# Patient Record
Sex: Female | Born: 1970
Health system: Southern US, Community
[De-identification: ages and names within clinical notes are randomized; demographics above are authoritative.]

## PROBLEM LIST (undated history)

## (undated) DIAGNOSIS — E559 Vitamin D deficiency, unspecified: Secondary | ICD-10-CM

## (undated) DIAGNOSIS — K219 Gastro-esophageal reflux disease without esophagitis: Secondary | ICD-10-CM

## (undated) DIAGNOSIS — G4730 Sleep apnea, unspecified: Secondary | ICD-10-CM

## (undated) DIAGNOSIS — M1990 Unspecified osteoarthritis, unspecified site: Secondary | ICD-10-CM

## (undated) DIAGNOSIS — I10 Essential (primary) hypertension: Secondary | ICD-10-CM

## (undated) DIAGNOSIS — K589 Irritable bowel syndrome without diarrhea: Secondary | ICD-10-CM

## (undated) DIAGNOSIS — I4891 Unspecified atrial fibrillation: Secondary | ICD-10-CM

## (undated) DIAGNOSIS — J45909 Unspecified asthma, uncomplicated: Secondary | ICD-10-CM

## (undated) DIAGNOSIS — I499 Cardiac arrhythmia, unspecified: Secondary | ICD-10-CM

## (undated) DIAGNOSIS — J189 Pneumonia, unspecified organism: Secondary | ICD-10-CM

## (undated) HISTORY — DX: Unspecified atrial fibrillation: I48.91

## (undated) HISTORY — PX: ENDOMETRIAL ABLATION: SHX621

## (undated) HISTORY — PX: COLONOSCOPY: SHX174

## (undated) HISTORY — DX: Vitamin D deficiency, unspecified: E55.9

---

## 1999-07-16 ENCOUNTER — Other Ambulatory Visit: Admission: RE | Admit: 1999-07-16 | Discharge: 1999-07-16 | Payer: Self-pay | Admitting: Otolaryngology

## 2000-09-10 ENCOUNTER — Encounter: Admission: RE | Admit: 2000-09-10 | Discharge: 2000-09-10 | Payer: Self-pay | Admitting: Surgery

## 2000-09-10 ENCOUNTER — Encounter: Payer: Self-pay | Admitting: Surgery

## 2002-04-28 ENCOUNTER — Other Ambulatory Visit: Admission: RE | Admit: 2002-04-28 | Discharge: 2002-04-28 | Payer: Self-pay | Admitting: Obstetrics and Gynecology

## 2003-10-16 ENCOUNTER — Other Ambulatory Visit: Admission: RE | Admit: 2003-10-16 | Discharge: 2003-10-16 | Payer: Self-pay | Admitting: Obstetrics and Gynecology

## 2004-12-24 ENCOUNTER — Other Ambulatory Visit: Admission: RE | Admit: 2004-12-24 | Discharge: 2004-12-24 | Payer: Self-pay | Admitting: Obstetrics and Gynecology

## 2005-12-29 ENCOUNTER — Other Ambulatory Visit: Admission: RE | Admit: 2005-12-29 | Discharge: 2005-12-29 | Payer: Self-pay | Admitting: Obstetrics and Gynecology

## 2006-01-08 ENCOUNTER — Encounter: Admission: RE | Admit: 2006-01-08 | Discharge: 2006-01-08 | Payer: Self-pay | Admitting: Obstetrics and Gynecology

## 2007-03-02 ENCOUNTER — Encounter: Admission: RE | Admit: 2007-03-02 | Discharge: 2007-03-02 | Payer: Self-pay | Admitting: Obstetrics and Gynecology

## 2009-02-25 ENCOUNTER — Encounter: Admission: RE | Admit: 2009-02-25 | Discharge: 2009-02-25 | Payer: Self-pay | Admitting: Family Medicine

## 2009-04-29 ENCOUNTER — Encounter: Admission: RE | Admit: 2009-04-29 | Discharge: 2009-04-29 | Payer: Self-pay | Admitting: Family Medicine

## 2010-10-26 ENCOUNTER — Encounter: Payer: Self-pay | Admitting: Family Medicine

## 2013-07-21 ENCOUNTER — Other Ambulatory Visit: Payer: Self-pay

## 2013-07-21 DIAGNOSIS — Z1231 Encounter for screening mammogram for malignant neoplasm of breast: Secondary | ICD-10-CM

## 2013-09-08 ENCOUNTER — Ambulatory Visit: Payer: Self-pay

## 2013-09-11 ENCOUNTER — Ambulatory Visit: Payer: Self-pay

## 2013-10-13 ENCOUNTER — Ambulatory Visit
Admission: RE | Admit: 2013-10-13 | Discharge: 2013-10-13 | Disposition: A | Discharge status: Home / Self Care | Payer: 59 | Source: Ambulatory Visit

## 2013-10-13 DIAGNOSIS — Z1231 Encounter for screening mammogram for malignant neoplasm of breast: Secondary | ICD-10-CM

## 2013-10-17 ENCOUNTER — Other Ambulatory Visit: Payer: Self-pay | Admitting: Family Medicine

## 2013-10-17 DIAGNOSIS — R928 Other abnormal and inconclusive findings on diagnostic imaging of breast: Secondary | ICD-10-CM

## 2013-10-27 ENCOUNTER — Other Ambulatory Visit: Payer: 59

## 2013-11-03 ENCOUNTER — Ambulatory Visit
Admission: RE | Admit: 2013-11-03 | Discharge: 2013-11-03 | Disposition: A | Discharge status: Home / Self Care | Payer: 59 | Source: Ambulatory Visit | Attending: Family Medicine | Admitting: Family Medicine

## 2013-11-03 DIAGNOSIS — R928 Other abnormal and inconclusive findings on diagnostic imaging of breast: Secondary | ICD-10-CM

## 2014-11-13 ENCOUNTER — Other Ambulatory Visit: Payer: Self-pay

## 2014-11-13 DIAGNOSIS — Z1231 Encounter for screening mammogram for malignant neoplasm of breast: Secondary | ICD-10-CM

## 2014-11-21 ENCOUNTER — Ambulatory Visit: Payer: Self-pay

## 2014-11-27 ENCOUNTER — Ambulatory Visit
Admission: RE | Admit: 2014-11-27 | Discharge: 2014-11-27 | Disposition: A | Discharge status: Home / Self Care | Payer: 59 | Source: Ambulatory Visit

## 2014-11-27 DIAGNOSIS — Z1231 Encounter for screening mammogram for malignant neoplasm of breast: Secondary | ICD-10-CM

## 2015-05-14 ENCOUNTER — Other Ambulatory Visit: Payer: Self-pay | Admitting: Internal Medicine

## 2015-05-14 DIAGNOSIS — E049 Nontoxic goiter, unspecified: Secondary | ICD-10-CM

## 2015-05-31 ENCOUNTER — Other Ambulatory Visit: Payer: 59

## 2015-06-11 DIAGNOSIS — K219 Gastro-esophageal reflux disease without esophagitis: Secondary | ICD-10-CM | POA: Insufficient documentation

## 2015-06-11 DIAGNOSIS — J3089 Other allergic rhinitis: Secondary | ICD-10-CM | POA: Insufficient documentation

## 2015-06-11 DIAGNOSIS — J45901 Unspecified asthma with (acute) exacerbation: Secondary | ICD-10-CM | POA: Insufficient documentation

## 2015-06-11 DIAGNOSIS — J4520 Mild intermittent asthma, uncomplicated: Secondary | ICD-10-CM

## 2015-06-11 DIAGNOSIS — J309 Allergic rhinitis, unspecified: Secondary | ICD-10-CM

## 2015-06-11 DIAGNOSIS — H1010 Acute atopic conjunctivitis, unspecified eye: Secondary | ICD-10-CM

## 2015-06-11 DIAGNOSIS — J302 Other seasonal allergic rhinitis: Secondary | ICD-10-CM | POA: Insufficient documentation

## 2015-07-01 ENCOUNTER — Ambulatory Visit
Admission: RE | Admit: 2015-07-01 | Discharge: 2015-07-01 | Disposition: A | Discharge status: Home / Self Care | Payer: Commercial Managed Care - HMO | Source: Ambulatory Visit | Attending: Internal Medicine | Admitting: Internal Medicine

## 2015-07-01 DIAGNOSIS — E049 Nontoxic goiter, unspecified: Secondary | ICD-10-CM

## 2015-07-12 ENCOUNTER — Ambulatory Visit (INDEPENDENT_AMBULATORY_CARE_PROVIDER_SITE_OTHER): Payer: Commercial Managed Care - HMO | Admitting: Neurology

## 2015-07-12 DIAGNOSIS — J309 Allergic rhinitis, unspecified: Secondary | ICD-10-CM | POA: Diagnosis not present

## 2015-07-17 DIAGNOSIS — R1030 Lower abdominal pain, unspecified: Secondary | ICD-10-CM | POA: Insufficient documentation

## 2015-07-17 DIAGNOSIS — K625 Hemorrhage of anus and rectum: Secondary | ICD-10-CM | POA: Insufficient documentation

## 2015-07-17 DIAGNOSIS — I10 Essential (primary) hypertension: Secondary | ICD-10-CM | POA: Insufficient documentation

## 2015-07-17 DIAGNOSIS — K55039 Acute (reversible) ischemia of large intestine, extent unspecified: Secondary | ICD-10-CM | POA: Insufficient documentation

## 2015-07-19 DIAGNOSIS — K648 Other hemorrhoids: Secondary | ICD-10-CM | POA: Insufficient documentation

## 2015-07-19 LAB — HM COLONOSCOPY

## 2015-07-24 ENCOUNTER — Ambulatory Visit (INDEPENDENT_AMBULATORY_CARE_PROVIDER_SITE_OTHER): Payer: Commercial Managed Care - HMO | Admitting: Neurology

## 2015-07-24 DIAGNOSIS — J309 Allergic rhinitis, unspecified: Secondary | ICD-10-CM

## 2015-08-07 ENCOUNTER — Ambulatory Visit (INDEPENDENT_AMBULATORY_CARE_PROVIDER_SITE_OTHER): Payer: Commercial Managed Care - HMO | Admitting: Neurology

## 2015-08-07 DIAGNOSIS — J309 Allergic rhinitis, unspecified: Secondary | ICD-10-CM | POA: Diagnosis not present

## 2015-08-16 ENCOUNTER — Ambulatory Visit (INDEPENDENT_AMBULATORY_CARE_PROVIDER_SITE_OTHER): Payer: Commercial Managed Care - HMO | Admitting: *Deleted

## 2015-08-16 DIAGNOSIS — J309 Allergic rhinitis, unspecified: Secondary | ICD-10-CM

## 2015-12-26 ENCOUNTER — Other Ambulatory Visit: Payer: Self-pay | Admitting: Allergy and Immunology

## 2015-12-26 NOTE — Telephone Encounter (Signed)
Needs ov for further refills 

## 2016-01-21 ENCOUNTER — Other Ambulatory Visit: Payer: Self-pay | Admitting: Allergy and Immunology

## 2016-02-02 ENCOUNTER — Other Ambulatory Visit: Payer: Self-pay | Admitting: Allergy and Immunology

## 2016-02-05 ENCOUNTER — Other Ambulatory Visit: Payer: Self-pay | Admitting: Surgery

## 2016-02-19 ENCOUNTER — Encounter (HOSPITAL_COMMUNITY): Payer: Self-pay

## 2016-02-19 ENCOUNTER — Encounter (HOSPITAL_COMMUNITY)
Admission: RE | Admit: 2016-02-19 | Discharge: 2016-02-19 | Disposition: A | Discharge status: Home / Self Care | Payer: Commercial Managed Care - HMO | Source: Ambulatory Visit | Attending: Surgery | Admitting: Surgery

## 2016-02-19 DIAGNOSIS — Z79899 Other long term (current) drug therapy: Secondary | ICD-10-CM | POA: Insufficient documentation

## 2016-02-19 DIAGNOSIS — R000 Tachycardia, unspecified: Secondary | ICD-10-CM | POA: Diagnosis not present

## 2016-02-19 DIAGNOSIS — J45909 Unspecified asthma, uncomplicated: Secondary | ICD-10-CM | POA: Diagnosis not present

## 2016-02-19 DIAGNOSIS — Z01812 Encounter for preprocedural laboratory examination: Secondary | ICD-10-CM | POA: Diagnosis not present

## 2016-02-19 DIAGNOSIS — R2231 Localized swelling, mass and lump, right upper limb: Secondary | ICD-10-CM | POA: Insufficient documentation

## 2016-02-19 DIAGNOSIS — I10 Essential (primary) hypertension: Secondary | ICD-10-CM | POA: Diagnosis not present

## 2016-02-19 DIAGNOSIS — Z01818 Encounter for other preprocedural examination: Secondary | ICD-10-CM | POA: Insufficient documentation

## 2016-02-19 HISTORY — DX: Gastro-esophageal reflux disease without esophagitis: K21.9

## 2016-02-19 HISTORY — DX: Essential (primary) hypertension: I10

## 2016-02-19 HISTORY — DX: Unspecified osteoarthritis, unspecified site: M19.90

## 2016-02-19 HISTORY — DX: Unspecified asthma, uncomplicated: J45.909

## 2016-02-19 HISTORY — DX: Irritable bowel syndrome, unspecified: K58.9

## 2016-02-19 LAB — CBC
HEMATOCRIT: 39.2 % (ref 36.0–46.0)
Hemoglobin: 12.8 g/dL (ref 12.0–15.0)
MCH: 29.2 pg (ref 26.0–34.0)
MCHC: 32.7 g/dL (ref 30.0–36.0)
MCV: 89.3 fL (ref 78.0–100.0)
Platelets: 300 10*3/uL (ref 150–400)
RBC: 4.39 MIL/uL (ref 3.87–5.11)
RDW: 14.3 % (ref 11.5–15.5)
WBC: 6.1 10*3/uL (ref 4.0–10.5)

## 2016-02-19 LAB — HCG, SERUM, QUALITATIVE: Preg, Serum: NEGATIVE

## 2016-02-19 NOTE — Pre-Procedure Instructions (Addendum)
Tracey Escobar  02/19/2016      [facility] [id number] Tracey Escobar, Tracey Escobar - [address] [facility] Tracey Escobar [address] [place] [id number] Phone: [phone] Fax: [phone]    Your procedure is scheduled on 02/25/16  [facility]  Call this number if you have problems the morning of surgery:  [phone]   Remember:  Do not eat food or drink liquids after midnight.  Take these medicines the morning of surgery with A SIP OF WATER inhalers(bring albuterol), xyzal,nasonex if needed, eye drops if needed, prilosec  STOP all herbel meds, nsaids (aleve,naproxen,advil,ibuprofen) starting today, including vitamins, aspirin, phenteramine   Do not wear jewelry, make-up or nail polish.  Do not wear lotions, powders, or perfumes.  You may wear deodorant.  Do not shave 48 hours prior to surgery.  Men may shave face and neck.  Do not bring valuables to the hospital.  [facility] is not responsible for any belongings or valuables.  Contacts, dentures or bridgework may not be worn into surgery.  Leave your suitcase in the car.  After surgery it may be brought to your room.  For patients admitted to the hospital, discharge time will be determined by your treatment team.  Patients discharged the day of surgery will not be allowed to drive home.   Name and phone number of your driver:    Special instructions:   Special Instructions: Tracey Escobar - Preparing for Surgery  Before surgery, you can play an important role.  Because skin is not sterile, your skin needs to be as free of germs as possible.  You can reduce the number of germs on you skin by washing with CHG (chlorahexidine gluconate) soap before surgery.  CHG is an antiseptic cleaner which kills germs and bonds with the skin to continue killing germs even after washing.  Please DO NOT use if you have an allergy to CHG or antibacterial soaps.  If your skin becomes  reddened/irritated stop using the CHG and inform your nurse when you arrive at Short Stay.  Do not shave (including legs and underarms) for at least 48 hours prior to the first CHG shower.  You may shave your face.  Please follow these instructions carefully:   1.  Shower with CHG Soap the night before surgery and the morning of Surgery.  2.  If you choose to wash your hair, wash your hair first as usual with your normal shampoo.  3.  After you shampoo, rinse your hair and body thoroughly to remove the Shampoo.  4.  Use CHG as you would any other liquid soap.  You can apply chg directly  to the skin and wash gently with scrungie or a clean washcloth.  5.  Apply the CHG Soap to your body ONLY FROM THE NECK DOWN.  Do not use on open wounds or open sores.  Avoid contact with your eyes ears, mouth and genitals (private parts).  Wash genitals (private parts)       with your normal soap.  6.  Wash thoroughly, paying special attention to the area where your surgery will be performed.  7.  Thoroughly rinse your body with warm water from the neck down.  8.  DO NOT shower/wash with your normal soap after using and rinsing off the CHG Soap.  9.  Pat yourself dry with a clean towel.  10.  Wear clean pajamas.            11.  Place clean sheets on your bed the night of your first shower and do not sleep with pets.  Day of Surgery  Do not apply any lotions/deodorants the morning of surgery.  Please wear clean clothes to the hospital/surgery center.  Please read over the following fact sheets that you were given. Pain Booklet, Coughing and Deep Breathing and Surgical Site Infection Prevention

## 2016-02-20 ENCOUNTER — Encounter (HOSPITAL_COMMUNITY): Payer: Self-pay

## 2016-02-20 NOTE — Progress Notes (Signed)
Re-requested EKG/stress test/[facility]

## 2016-02-20 NOTE — Progress Notes (Signed)
Anesthesia Chart Review:  Pt is a 45 year old female scheduled for R upper arm mass excision on 02/25/2016 with Dr. Barrie Dunker. [name].  Cardiologist is Dr. Yates DecampJay Ganji.   PMH includes:  HTN, asthma. Never smoker. BMI 41  Medications include: albuterol, dulera, prilosec, phentermine, spironolactone, telmisartan. Pt instructed to stop phentermine at PAT (02/19/16).   Preoperative labs reviewed.  BMET not obtained at PAT and will need to be obtained DOS.   EKG 02/19/16: sinus tachycardia (102 bpm)  Exercise treadmill test 05/10/15:  - ECG showing sinus tach. No arrhythmias noted. No ST-T changed of ischemia - Markedly reduced aerobic tolerance  Echo 05/09/15:  - LV cavity normal in size. Mild concentric LVH. Normal global wall motion. EF 55%. Grade I diastolic dysfunction.  - LA cavity normal in size. An aneurysm with possible PFO is present. Suggest air bubble study to look for shunt - Mild MR - Mild TR. No evidence of pulmonary HTN  By Dr. Verl DickerGanji's notes, pt had cardiac cath 03/06/11 that showed ramus intermediate with 70% ostial stenosis, otherwise normal.   By Dr. Verl DickerGanji's notes, pt has mild L ICA stenosis (16-49%) by carotid duplex 05/09/15.   If labs acceptable DOS, I anticipate pt can proceed as scheduled.   Rica Mastngela Dana Dorner, FNP-BC [facility]iology Phone: [phone] 02/20/2016 3:22 PM

## 2016-02-24 MED ORDER — CEFAZOLIN SODIUM-DEXTROSE 2-4 GM/100ML-% IV SOLN
2.0000 g | INTRAVENOUS | Status: AC
Start: 2016-02-25 — End: 2016-02-25
  Administered 2016-02-25: 2 g via INTRAVENOUS
  Filled 2016-02-24: qty 100

## 2016-02-24 NOTE — H&P (Signed)
Tracey Escobar 02/05/2016 10:36 AM Location: [facility] Patient #: [id number] DOB: 06/02/1971 Single / Language: [place] / Race: Black or African American Female   History of Present Illness (Tracey Mahler A. Magnus IvanBlackman MD; 02/05/2016 10:42 AM) Patient words: lipoma right arm.  The patient is a 45 year old female who presents with a complaint of Mass. This is a pleasant female referred by Dr. Dorothyann Pengobyn Escobar for evaluation of a right posterior upper arm mass. The patient has had the mass for several years but it is now slowly larger and causing her to have focal pain at the back of her arm. She has no paresthesias in the arm. She denies trauma to the area. She has no previous history of masses. She denies drainage from the area. She has had no fevers or night sweats. She is otherwise without complaints.   Other Problems Tracey Escobar(Tracey Escobar, RMA; 02/05/2016 10:36 AM) Asthma Gastroesophageal Reflux Disease High blood pressure  Past Surgical History Tracey Escobar(Tracey Escobar, RMA; 02/05/2016 10:36 AM) No pertinent past surgical history  Diagnostic Studies History Tracey Escobar(Tracey Escobar, RMA; 02/05/2016 10:36 AM) Colonoscopy within last year Mammogram 1-3 years ago Pap Smear 1-5 years ago  Allergies Tracey Escobar(Tracey Escobar, RMA; 02/05/2016 10:37 AM) No Known Drug Allergies05/12/2015  Medication History Tracey Escobar(Tracey Escobar, RMA; 02/05/2016 10:40 AM) Levocetirizine Dihydrochloride (5MG  Tablet, Oral) Active. MedroxyPROGESTERone Acetate (150MG /ML Susp Pref Syr, Intramuscular) Active. Oseltamivir Phosphate (75MG  Capsule, Oral) Active. Pazeo (0.7% Solution, Ophthalmic) Active. Restasis (0.05% Emulsion, Ophthalmic) Active. Telmisartan (40MG  Tablet, Oral) Active. Albuterol (90MCG/ACT Aerosol Soln, Inhalation) Active. Omeprazole (20MG  Tablet DR, Oral) Active. Spironolactone (25MG  Tablet, Oral) Active. Senna (8.6MG  Tablet, Oral) Active. Dulera (200-5MCG/ACT Aerosol, Inhalation) Active. Vitamin D3 (1000UNIT Capsule,  Oral) Active. Medications Reconciled  Social History Tracey Escobar(Tracey Escobar, RMA; 02/05/2016 10:36 AM) Alcohol use Occasional alcohol use. Caffeine use Carbonated beverages, Coffee, Tea. No drug use Tobacco use Never smoker.  Family History Tracey Escobar(Tracey Escobar, RMA; 02/05/2016 10:36 AM) Arthritis Father. Diabetes Mellitus Brother. Hypertension Brother, Father, Mother.  Pregnancy / Birth History Tracey Escobar(Tracey Escobar, RMA; 02/05/2016 10:36 AM) Contraceptive History Depo-provera. Gravida 0 Para 0    Review of Systems (Tracey Escobar RMA; 02/05/2016 10:36 AM) General Present- Weight Loss. Not Present- Appetite Loss, Chills, Fatigue, Fever, Night Sweats and Weight Gain. Skin Present- Dryness and Rash. Not Present- Change in Wart/Mole, Hives, Jaundice, New Lesions, Non-Healing Wounds and Ulcer. HEENT Present- Seasonal Allergies, Sinus Pain and Wears glasses/contact lenses. Not Present- Earache, Hearing Loss, Hoarseness, Nose Bleed, Oral Ulcers, Ringing in the Ears, Sore Throat, Visual Disturbances and Yellow Eyes. Respiratory Not Present- Bloody sputum, Chronic Cough, Difficulty Breathing, Snoring and Wheezing. Breast Not Present- Breast Mass, Breast Pain, Nipple Discharge and Skin Changes. Cardiovascular Present- Shortness of Breath and Swelling of Extremities. Not Present- Chest Pain, Difficulty Breathing Lying Down, Leg Cramps, Palpitations and Rapid Heart Rate. Gastrointestinal Present- Constipation. Not Present- Abdominal Pain, Bloating, Bloody Stool, Change in Bowel Habits, Chronic diarrhea, Difficulty Swallowing, Excessive gas, Gets full quickly at meals, Hemorrhoids, Indigestion, Nausea, Rectal Pain and Vomiting. Female Genitourinary Not Present- Frequency, Nocturia, Painful Urination, Pelvic Pain and Urgency. Musculoskeletal Present- Joint Stiffness and Swelling of Extremities. Not Present- Back Pain, Joint Pain, Muscle Pain and Muscle Weakness. Neurological Not Present- Decreased Memory, Fainting,  Headaches, Numbness, Seizures, Tingling, Tremor, Trouble walking and Weakness. Psychiatric Not Present- Anxiety, Bipolar, Change in Sleep Pattern, Depression, Fearful and Frequent crying. Endocrine Not Present- Cold Intolerance, Excessive Hunger, Hair Changes, Heat Intolerance, Hot flashes and New Diabetes. Hematology Not Present- Easy Bruising, Excessive bleeding, Gland problems, HIV  and Persistent Infections.  Vitals (Tracey Escobar RMA; 02/05/2016 10:40 AM) 02/05/2016 10:40 AM Weight: 264 lb Height: 67in Body Surface Area: 2.28 m Body Mass Index: 41.35 kg/m  Temp.: 97.15F  Pulse: 88 (Regular)  BP: 144/88 (Sitting, Left Arm, Standard)       Physical Exam (Tracey Posch A. Magnus Ivan MD; 02/05/2016 10:44 AM) General Mental Status-Alert. General Appearance-Consistent with stated age. Hydration-Well hydrated. Voice-Normal.  Head and Neck Head-normocephalic, atraumatic with no lesions or palpable masses. Trachea-midline. Thyroid Gland Characteristics - normal size and consistency.  Eye Eyeball - Bilateral-Extraocular movements intact. Sclera/Conjunctiva - Bilateral-No scleral icterus.  Chest and Lung Exam Chest and lung exam reveals -quiet, even and easy respiratory effort with no use of accessory muscles and on auscultation, normal breath sounds, no adventitious sounds and normal vocal resonance. Inspection Chest Wall - Normal. Back - normal.  Breast - Did not examine.  Cardiovascular Cardiovascular examination reveals -normal heart sounds, regular rate and rhythm with no murmurs and normal pedal pulses bilaterally.  Abdomen - Did not examine.  Neurologic Neurologic evaluation reveals -alert and oriented x 3 with no impairment of recent or remote memory. Mental Status-Normal.  Musculoskeletal Normal Exam - Left-Upper Extremity Strength Normal and Lower Extremity Strength Normal. Normal Exam - Right-Upper Extremity Strength Normal and Lower  Extremity Strength Normal. Note: There is a large mass on the posterior aspect of her right upper arm. It is approximate 7 cm in size. It is soft and mobile. There are no skin changes. There is no axillary adenopathy.   Lymphatic Head & Neck  General Head & Neck Lymphatics: Bilateral - Description - Normal. Axillary  General Axillary Region: Bilateral - Description - Normal. Tenderness - Non Tender. Femoral & Inguinal - Did not examine.    Assessment & Plan (Wava Kildow A. Magnus Ivan MD; 02/05/2016 10:44 AM) ARM MASS, RIGHT (R22.31) Impression: This is a large mass of uncertain etiology. Because it is symptomatic, causing pain, and getting larger, surgical removal is recommended to rule out a malignancy as a sarcoma. Hopefully, this just represents a large symptomatic lipoma. I discussed the surgery with her in detail. I discussed the risks which includes but is not limited to bleeding, infection, injury to surrounding structures, recurrence, need for further surgery, etc. She understands and wishes to proceed with surgery

## 2016-02-24 NOTE — Anesthesia Preprocedure Evaluation (Addendum)
Anesthesia Evaluation  Patient identified by MRN, date of birth, ID band Patient awake    Reviewed: Allergy & Precautions, H&P , NPO status , Patient's Chart, lab work & pertinent test results  History of Anesthesia Complications Negative for: history of anesthetic complications  Airway Mallampati: II  TM Distance: >3 FB Neck ROM: full    Dental no notable dental hx. (+) Teeth Intact, Dental Advidsory Given   Pulmonary asthma ,    Pulmonary exam normal breath sounds clear to auscultation       Cardiovascular hypertension, Pt. on medications Normal cardiovascular exam Rhythm:regular Rate:Normal  2016 with normal Echo 55%, possible PFO, mild MR   Neuro/Psych negative neurological ROS     GI/Hepatic Neg liver ROS, GERD  ,  Endo/Other  negative endocrine ROS  Renal/GU negative Renal ROS     Musculoskeletal   Abdominal   Peds  Hematology negative hematology ROS (+)   Anesthesia Other Findings   Reproductive/Obstetrics negative OB ROS                          Anesthesia Physical Anesthesia Plan  ASA: II  Anesthesia Plan: General   Post-op Pain Management:    Induction: Intravenous  Airway Management Planned: Oral ETT  Additional Equipment:   Intra-op Plan:   Post-operative Plan:   Informed Consent: I have reviewed the patients History and Physical, chart, labs and discussed the procedure including the risks, benefits and alternatives for the proposed anesthesia with the patient or authorized representative who has indicated his/her understanding and acceptance.   Dental Advisory Given  Plan Discussed with: Anesthesiologist, CRNA and Surgeon  Anesthesia Plan Comments:        Anesthesia Quick Evaluation

## 2016-02-25 ENCOUNTER — Ambulatory Visit (HOSPITAL_COMMUNITY): Payer: Commercial Managed Care - HMO | Admitting: Vascular Surgery

## 2016-02-25 ENCOUNTER — Ambulatory Visit (HOSPITAL_COMMUNITY)
Admission: RE | Admit: 2016-02-25 | Discharge: 2016-02-25 | Disposition: A | Discharge status: Home / Self Care | Payer: Commercial Managed Care - HMO | Source: Ambulatory Visit | Attending: Surgery | Admitting: Surgery

## 2016-02-25 ENCOUNTER — Ambulatory Visit (HOSPITAL_COMMUNITY): Payer: Commercial Managed Care - HMO | Admitting: Anesthesiology

## 2016-02-25 ENCOUNTER — Encounter (HOSPITAL_COMMUNITY)
Admission: RE | Disposition: A | Discharge status: Home / Self Care | Payer: Self-pay | Source: Ambulatory Visit | Attending: Surgery

## 2016-02-25 ENCOUNTER — Encounter (HOSPITAL_COMMUNITY): Payer: Self-pay | Admitting: *Deleted

## 2016-02-25 DIAGNOSIS — Z7951 Long term (current) use of inhaled steroids: Secondary | ICD-10-CM | POA: Diagnosis not present

## 2016-02-25 DIAGNOSIS — J45909 Unspecified asthma, uncomplicated: Secondary | ICD-10-CM | POA: Insufficient documentation

## 2016-02-25 DIAGNOSIS — E65 Localized adiposity: Secondary | ICD-10-CM | POA: Insufficient documentation

## 2016-02-25 DIAGNOSIS — K219 Gastro-esophageal reflux disease without esophagitis: Secondary | ICD-10-CM | POA: Insufficient documentation

## 2016-02-25 DIAGNOSIS — Z79899 Other long term (current) drug therapy: Secondary | ICD-10-CM | POA: Diagnosis not present

## 2016-02-25 DIAGNOSIS — R2231 Localized swelling, mass and lump, right upper limb: Secondary | ICD-10-CM | POA: Diagnosis present

## 2016-02-25 DIAGNOSIS — I10 Essential (primary) hypertension: Secondary | ICD-10-CM | POA: Diagnosis not present

## 2016-02-25 HISTORY — PX: EXCISION MASS UPPER EXTREMETIES: SHX6704

## 2016-02-25 LAB — BASIC METABOLIC PANEL
ANION GAP: 9 (ref 5–15)
BUN: 9 mg/dL (ref 6–20)
CALCIUM: 9.4 mg/dL (ref 8.9–10.3)
CO2: 22 mmol/L (ref 22–32)
Chloride: 108 mmol/L (ref 101–111)
Creatinine, Ser: 0.77 mg/dL (ref 0.44–1.00)
GLUCOSE: 86 mg/dL (ref 65–99)
POTASSIUM: 3.5 mmol/L (ref 3.5–5.1)
Sodium: 139 mmol/L (ref 135–145)

## 2016-02-25 SURGERY — EXCISION MASS UPPER EXTREMITIES
Anesthesia: General | Site: Arm Upper | Laterality: Right

## 2016-02-25 MED ORDER — BUPIVACAINE-EPINEPHRINE (PF) 0.25% -1:200000 IJ SOLN
INTRAMUSCULAR | Status: AC
  Filled 2016-02-25: qty 30

## 2016-02-25 MED ORDER — HYDROCODONE-ACETAMINOPHEN 7.5-325 MG PO TABS: 1.0000 | ORAL_TABLET | Freq: Once | ORAL | Status: DC | PRN

## 2016-02-25 MED ORDER — FENTANYL CITRATE (PF) 100 MCG/2ML IJ SOLN
INTRAMUSCULAR | Status: AC
  Filled 2016-02-25: qty 2

## 2016-02-25 MED ORDER — BUPIVACAINE-EPINEPHRINE 0.25% -1:200000 IJ SOLN
INTRAMUSCULAR | Status: DC | PRN
  Administered 2016-02-25: 20 mL

## 2016-02-25 MED ORDER — FENTANYL CITRATE (PF) 250 MCG/5ML IJ SOLN
INTRAMUSCULAR | Status: AC
  Filled 2016-02-25: qty 5

## 2016-02-25 MED ORDER — 0.9 % SODIUM CHLORIDE (POUR BTL) OPTIME
TOPICAL | Status: DC | PRN
  Administered 2016-02-25: 1000 mL

## 2016-02-25 MED ORDER — ONDANSETRON HCL 4 MG/2ML IJ SOLN
INTRAMUSCULAR | Status: DC | PRN
  Administered 2016-02-25: 4 mg via INTRAVENOUS

## 2016-02-25 MED ORDER — METOCLOPRAMIDE HCL 5 MG/ML IJ SOLN: 10.0000 mg | Freq: Once | INTRAMUSCULAR | Status: DC | PRN

## 2016-02-25 MED ORDER — ALBUTEROL SULFATE (2.5 MG/3ML) 0.083% IN NEBU
2.5000 mg | INHALATION_SOLUTION | Freq: Once | RESPIRATORY_TRACT | Status: DC
  Filled 2016-02-25: qty 3

## 2016-02-25 MED ORDER — ALBUTEROL SULFATE (2.5 MG/3ML) 0.083% IN NEBU
INHALATION_SOLUTION | RESPIRATORY_TRACT | Status: AC
  Administered 2016-02-25: 2.5 mg
  Filled 2016-02-25: qty 3

## 2016-02-25 MED ORDER — MIDAZOLAM HCL 2 MG/2ML IJ SOLN
INTRAMUSCULAR | Status: AC
  Filled 2016-02-25: qty 2

## 2016-02-25 MED ORDER — FENTANYL CITRATE (PF) 100 MCG/2ML IJ SOLN
INTRAMUSCULAR | Status: DC | PRN
  Administered 2016-02-25 (×2): 50 ug via INTRAVENOUS

## 2016-02-25 MED ORDER — PROPOFOL 10 MG/ML IV BOLUS
INTRAVENOUS | Status: AC
  Filled 2016-02-25: qty 20

## 2016-02-25 MED ORDER — FENTANYL CITRATE (PF) 100 MCG/2ML IJ SOLN
25.0000 ug | INTRAMUSCULAR | Status: DC | PRN
  Administered 2016-02-25: 50 ug via INTRAVENOUS

## 2016-02-25 MED ORDER — HYDROCODONE-ACETAMINOPHEN 5-325 MG PO TABS: 1.0000 | ORAL_TABLET | ORAL | Status: DC | PRN

## 2016-02-25 MED ORDER — ONDANSETRON HCL 4 MG/2ML IJ SOLN
INTRAMUSCULAR | Status: AC
  Filled 2016-02-25: qty 2

## 2016-02-25 MED ORDER — LACTATED RINGERS IV SOLN
INTRAVENOUS | Status: DC
  Administered 2016-02-25: 07:00:00 via INTRAVENOUS

## 2016-02-25 MED ORDER — MIDAZOLAM HCL 5 MG/5ML IJ SOLN
INTRAMUSCULAR | Status: DC | PRN
  Administered 2016-02-25: 2 mg via INTRAVENOUS

## 2016-02-25 MED ORDER — LIDOCAINE HCL (CARDIAC) 20 MG/ML IV SOLN
INTRAVENOUS | Status: DC | PRN
  Administered 2016-02-25: 80 mg via INTRAVENOUS
  Administered 2016-02-25: 50 mg via INTRAVENOUS

## 2016-02-25 MED ORDER — PROPOFOL 10 MG/ML IV BOLUS
INTRAVENOUS | Status: DC | PRN
  Administered 2016-02-25 (×2): 200 mg via INTRAVENOUS

## 2016-02-25 MED ORDER — LACTATED RINGERS IV SOLN
INTRAVENOUS | Status: DC | PRN
  Administered 2016-02-25: 08:00:00 via INTRAVENOUS

## 2016-02-25 SURGICAL SUPPLY — 40 items
BLADE SURG 10 STRL SS (BLADE) ×2 IMPLANT
BLADE SURG 15 STRL LF DISP TIS (BLADE) ×1 IMPLANT
BLADE SURG 15 STRL SS (BLADE) ×1
CANISTER SUCTION 2500CC (MISCELLANEOUS) ×2 IMPLANT
COVER SURGICAL LIGHT HANDLE (MISCELLANEOUS) ×2 IMPLANT
DRAPE BILATERAL SPLIT (DRAPES) ×2 IMPLANT
DRAPE LAPAROSCOPIC ABDOMINAL (DRAPES) IMPLANT
DRAPE LAPAROTOMY T 98X78 PEDS (DRAPES) IMPLANT
DRAPE UTILITY XL STRL (DRAPES) ×2 IMPLANT
DRSG TEGADERM 4X4.75 (GAUZE/BANDAGES/DRESSINGS) ×2 IMPLANT
ELECT CAUTERY BLADE 6.4 (BLADE) ×2 IMPLANT
ELECT REM PT RETURN 9FT ADLT (ELECTROSURGICAL) ×2
ELECTRODE REM PT RTRN 9FT ADLT (ELECTROSURGICAL) ×1 IMPLANT
GAUZE SPONGE 4X4 12PLY STRL (GAUZE/BANDAGES/DRESSINGS) ×2 IMPLANT
GAUZE SPONGE 4X4 16PLY XRAY LF (GAUZE/BANDAGES/DRESSINGS) ×2 IMPLANT
GLOVE SURG SIGNA 7.5 PF LTX (GLOVE) ×2 IMPLANT
GLOVE SURG SS PI 7.5 STRL IVOR (GLOVE) ×2 IMPLANT
GOWN STRL REUS W/ TWL LRG LVL3 (GOWN DISPOSABLE) ×1 IMPLANT
GOWN STRL REUS W/ TWL XL LVL3 (GOWN DISPOSABLE) ×1 IMPLANT
GOWN STRL REUS W/TWL LRG LVL3 (GOWN DISPOSABLE) ×1
GOWN STRL REUS W/TWL XL LVL3 (GOWN DISPOSABLE) ×1
KIT BASIN OR (CUSTOM PROCEDURE TRAY) ×2 IMPLANT
KIT ROOM TURNOVER OR (KITS) ×2 IMPLANT
LIQUID BAND (GAUZE/BANDAGES/DRESSINGS) ×2 IMPLANT
NEEDLE HYPO 25X1 1.5 SAFETY (NEEDLE) ×2 IMPLANT
NS IRRIG 1000ML POUR BTL (IV SOLUTION) ×2 IMPLANT
PACK SURGICAL SETUP 50X90 (CUSTOM PROCEDURE TRAY) ×2 IMPLANT
PAD ARMBOARD 7.5X6 YLW CONV (MISCELLANEOUS) ×2 IMPLANT
PENCIL BUTTON HOLSTER BLD 10FT (ELECTRODE) ×2 IMPLANT
SPECIMEN JAR SMALL (MISCELLANEOUS) ×2 IMPLANT
SPONGE LAP 18X18 X RAY DECT (DISPOSABLE) ×2 IMPLANT
SUT MNCRL AB 4-0 PS2 18 (SUTURE) ×2 IMPLANT
SUT VIC AB 3-0 SH 27 (SUTURE) ×1
SUT VIC AB 3-0 SH 27XBRD (SUTURE) ×1 IMPLANT
SYR BULB 3OZ (MISCELLANEOUS) ×2 IMPLANT
SYR CONTROL 10ML LL (SYRINGE) ×2 IMPLANT
TOWEL OR 17X24 6PK STRL BLUE (TOWEL DISPOSABLE) ×2 IMPLANT
TOWEL OR 17X26 10 PK STRL BLUE (TOWEL DISPOSABLE) ×2 IMPLANT
TUBE CONNECTING 12X1/4 (SUCTIONS) IMPLANT
YANKAUER SUCT BULB TIP NO VENT (SUCTIONS) IMPLANT

## 2016-02-25 NOTE — Op Note (Signed)
EXCISION RIGHT UPPER ARM MASS  Procedure Note  Tracey Escobar 02/25/2016   Pre-op Diagnosis: Right upper arm mass     Post-op Diagnosis: same  Procedure(s): EXCISION RIGHT UPPER ARM MASS ( 8 cm )  Surgeon(s): Abigail Miyamotoouglas Libia Fazzini, MD  Anesthesia: Choice  Staff:  Circulator: Christene Slatesushdan M Islam, RN Scrub Person: Guy SandiferJames E Barrett  Estimated Blood Loss: Minimal               Specimens: sent to path          [facility] Escobar   Date: 02/25/2016  Time: 8:46 AM

## 2016-02-25 NOTE — Discharge Instructions (Signed)
Ok to shower tomorrow  No vigorous activity for 1 week  Ice pack and ibuprofen also for pain

## 2016-02-25 NOTE — Anesthesia Procedure Notes (Signed)
Procedure Name: LMA Insertion Date/Time: 02/25/2016 8:20 AM Performed by: Carmela RimaMARTINELLI, Govanni Plemons F Pre-anesthesia Checklist: Patient being monitored, Suction available, Emergency Drugs available, Patient identified and Timeout performed Patient Re-evaluated:Patient Re-evaluated prior to inductionOxygen Delivery Method: Circle system utilized Preoxygenation: Pre-oxygenation with 100% oxygen Intubation Type: IV induction Ventilation: Mask ventilation without difficulty LMA: LMA inserted LMA Size: 4.0 Placement Confirmation: positive ETCO2 and breath sounds checked- equal and bilateral Tube secured with: Tape Dental Injury: Teeth and Oropharynx as per pre-operative assessment

## 2016-02-25 NOTE — Transfer of Care (Signed)
Immediate Anesthesia Transfer of Care Note  Patient: Tracey Escobar  Procedure(s) Performed: Procedure(s): EXCISION RIGHT UPPER ARM MASS (Right)  Patient Location: PACU  Anesthesia Type:General  Level of Consciousness: awake, alert  and oriented  Airway & Oxygen Therapy: Patient Spontanous Breathing and Patient connected to nasal cannula oxygen  Post-op Assessment: Report given to RN, Post -op Vital signs reviewed and stable and Patient moving all extremities X 4  Post vital signs: Reviewed and stable  Last Vitals:  Filed Vitals:   02/25/16 0657  BP: 127/71  Pulse: 83  Temp: 37.2 C  Resp: 20    Last Pain: There were no vitals filed for this visit.    Patients Stated Pain Goal: 6 (02/25/16 0710)  Complications: No apparent anesthesia complications

## 2016-02-25 NOTE — Op Note (Signed)
NAMGraceann Congress:  [name], [name]              ACCOUNT NO.:  [id number]  MEDICAL RECORD NO.:  [id number]  LOCATION:  [facility]                         FACILITY:  [facility]  PHYSICIAN:  Abigail Miyamotoouglas Ken Bonn, M.D. DATE OF BIRTH:  02-Nov-1970  DATE OF PROCEDURE:  02/25/2016 DATE OF DISCHARGE:                              OPERATIVE REPORT   PREOPERATIVE DIAGNOSIS:  Right upper arm mass.  POSTOPERATIVE DIAGNOSIS:  Right upper arm mass.  PROCEDURE:  Excision of 8 cm right upper arm mass.  SURGEON:  Abigail Miyamotoouglas Mariellen Blaney, M.D.  ANESTHESIA:  General with 0.25% Marcaine.  ESTIMATED BLOOD LOSS:  Minimal.  FINDINGS:  The patient was found to have a large mass which was approximately 8 cm in size.  It appeared consistent with a lipoma.  It was in the subcutaneous tissue.  PROCEDURE IN DETAIL:  The patient was brought to the operating room, identified as Tracey Escobar.  She was placed supine on the operating room table and general anesthesia was induced.  Her arm was then brought across her body.  The right posterior upper arm was then prepped and draped in the usual sterile fashion.  I anesthetized the skin over the palpable mass with Marcaine.  I then made a longitudinal incision with a scalpel.  I took this down to the subcutaneous tissue with the electrocautery.  The mass was then easily dissected free and elevated both bluntly with the cautery.  Once it was elevated above the wound, I completed the excision with the electrocautery.  The mass was soft and consistent with a lipoma.  It was approximately 8 cm in size.  It was sent to Pathology for evaluation.  I achieved hemostasis with cautery. I anesthetized the wound further with Marcaine.  I then closed the subcutaneous tissue with interrupted 3-0 Vicryl sutures and the skin with a running 4-0 Monocryl.  Skin glue was then applied.  The patient tolerated the procedure well.  All sponge, needle, and instrument counts were correct at the end of the procedure.   The patient was then extubated in the operating room and taken in a stable condition to the recovery room.     Abigail Miyamotoouglas Raif Chachere, M.D.     DB/MEDQ  D:  02/25/2016  T:  02/25/2016  Job:  [id number]

## 2016-02-25 NOTE — Anesthesia Postprocedure Evaluation (Signed)
Anesthesia Post Note  Patient: Tracey Escobar  Procedure(s) Performed: Procedure(s) (LRB): EXCISION RIGHT UPPER ARM MASS (Right)  Patient location during evaluation: PACU Anesthesia Type: General Level of consciousness: awake and alert Pain management: pain level controlled Vital Signs Assessment: post-procedure vital signs reviewed and stable Respiratory status: spontaneous breathing, nonlabored ventilation, respiratory function stable and patient connected to nasal cannula oxygen Cardiovascular status: blood pressure returned to baseline and stable Postop Assessment: no signs of nausea or vomiting Anesthetic complications: no    Last Vitals:  Filed Vitals:   02/25/16 0855 02/25/16 0915  BP: 148/81 159/87  Pulse: 69 79  Temp: 36.6 C   Resp: 22 19    Last Pain:  Filed Vitals:   02/25/16 0915  PainSc: 5                  Reino KentJudd, Zo Loudon J

## 2016-02-25 NOTE — Interval H&P Note (Signed)
History and Physical Interval Note: no change in H and P  02/25/2016 6:39 AM  [name]  has presented today for surgery, with the diagnosis of Right upper arm mass  The various methods of treatment have been discussed with the patient and family. After consideration of risks, benefits and other options for treatment, the patient has consented to  Procedure(s): EXCISION RIGHT UPPER ARM MASS (Right) as a surgical intervention .  The patient's history has been reviewed, patient examined, no change in status, stable for surgery.  I have reviewed the patient's chart and labs.  Questions were answered to the patient's satisfaction.     [place] A

## 2016-02-26 ENCOUNTER — Encounter (HOSPITAL_COMMUNITY): Payer: Self-pay | Admitting: Surgery

## 2016-07-13 ENCOUNTER — Ambulatory Visit (INDEPENDENT_AMBULATORY_CARE_PROVIDER_SITE_OTHER): Payer: Commercial Managed Care - HMO | Admitting: Neurology

## 2016-07-13 ENCOUNTER — Encounter: Payer: Self-pay | Admitting: Neurology

## 2016-07-13 VITALS — BP 146/96 | HR 96 | Resp 20 | Ht 67.0 in | Wt 272.0 lb

## 2016-07-13 DIAGNOSIS — F5101 Primary insomnia: Secondary | ICD-10-CM | POA: Diagnosis not present

## 2016-07-13 DIAGNOSIS — R0683 Snoring: Secondary | ICD-10-CM | POA: Diagnosis not present

## 2016-07-13 DIAGNOSIS — E6609 Other obesity due to excess calories: Secondary | ICD-10-CM | POA: Diagnosis not present

## 2016-07-13 NOTE — Progress Notes (Signed)
SLEEP MEDICINE CLINIC   Provider:  Melvyn Novas, M D  Referring Provider: Dorothyann Peng, MD Primary Care Physician:  [name], MD  Chief Complaint  Patient presents with  . New Patient (Initial Visit)    numbness and tingling in legs, snores at night, has had a sleep study over 10 years    HPI:  Tracey Escobar is a 45 y.o. female , seen here as a referral from Dr. Allyne Gee for a sleep consult.   Chief complaint according to patient : I cannot get enough sleep.   Tracey Escobar is a 45 year old female patient referred for a sleep evaluation based on the feeling of chronic and intense fatigue. She feels tired all the time. She has been treated in the past for gastric reflux disease, asthma, vitamin D deficiency, and hypertension. She has had weight gain, but has no thyroid disease and no diabetes.  She feels tired and "never " got 8 hours of sleep. Some nights she will not sleep, just doze.   I reviewed her medication an most recent labs.   Sleep habits are as follows: Tracey Escobar is used to sleep with the TV in the background. She has however experience that his sleep sounder if the TV is not running. She feels TV helps her to fall asleep not to stay asleep. The bedroom is otherwise cool, quiet. She prefers to sleep on her side, with 2 pillows supporting her head. Her alarm clock is set for 6:15 AM and she aims to go to bed before midnight but often has trouble falling asleep. She has not established a firm time to go to bed but usually it will be between 10 and 11 PM. She wakes up frequently at night her sleep is fragmented and she has multiple bathroom breaks. He does not sleep urge to urinate that wakes her up once she is awake she does go to the bathroom. Sometimes she wakes up with headaches other nights she may go to bed with a headache. She reports vividly dreaming and dreaming almost every night, sometimes she feels that she cannot possibly be asleep because her dreams  are life like. She has woken herself by snoring and has been told that she snores, she has not been told that she stops breathing at night. She experienced sleep choking when falling asleep in a seated position.  She doesn't nap but craves to nap !   Sleep medical history and family sleep history: " I have had sleep problems all my life " .   Social history: Lives alone, She may drink alcohol 1-4 times per year, she has never used tobacco products, caffeine use in form of iced tea and soda up to 4 sometimes more than 4 a day. She works from 8:30 AM to 5 PM, has no history of shift work.  Review of Systems: Out of a complete 14 system review, the patient complains of only the following symptoms, and all other reviewed systems are negative.  Epworth score 10 , Fatigue severity score 47   , depression score 4/15, achyness, numbness, tingling in legs and feet, spasms worse on the left, charleyhorse.    Social History   Social History  . Marital status: Single    Spouse name: N/A  . Number of children: N/A  . Years of education: N/A   Occupational History  . Not on file.   Social History Main Topics  . Smoking status: Never Smoker  . Smokeless tobacco: Not on  file  . Alcohol use No  . Drug use: No  . Sexual activity: Not on file   Other Topics Concern  . Not on file   Social History Narrative  . No narrative on file    No family history on file.  Past Medical History:  Diagnosis Date  . Arthritis   . Asthma   . GERD (gastroesophageal reflux disease)   . Hypertension   . Irritable bowel   . Vitamin D deficiency     Past Surgical History:  Procedure Laterality Date  . EXCISION MASS UPPER EXTREMETIES Right 02/25/2016   Procedure: EXCISION RIGHT UPPER ARM MASS;  Surgeon: Abigail Miyamoto, MD;  Location: [facility] OR;  Service: General;  Laterality: Right;    Current Outpatient Prescriptions  Medication Sig Dispense Refill  . albuterol (VENTOLIN HFA) 108 (90 BASE) MCG/ACT  inhaler Inhale 2 puffs into the lungs every 6 (six) hours as needed for wheezing or shortness of breath.    . calcium carbonate (OS-CAL - DOSED IN MG OF ELEMENTAL CALCIUM) 1250 (500 Ca) MG tablet Take 1 tablet by mouth daily with breakfast.    . Cholecalciferol (VITAMIN D3) 5000 UNITS TABS Take 1 tablet by mouth daily.    . cycloSPORINE (RESTASIS) 0.05 % ophthalmic emulsion Place 1 drop into both eyes 2 (two) times daily.    Marland Kitchen HYDROcodone-acetaminophen (NORCO) 5-325 MG tablet Take 1-2 tablets by mouth every 4 (four) hours as needed for moderate pain. 30 tablet 0  . levocetirizine (XYZAL) 5 MG tablet Take 5 mg by mouth daily.    . Linaclotide (LINZESS PO) Take by mouth 1 day or 1 dose.    . mometasone-formoterol (DULERA) 100-5 MCG/ACT AERO Inhale 2 puffs into the lungs daily.    Marland Kitchen PAZEO 0.7 % SOLN INSTILL 1 DROP INTO BOTH EYES EVERY DAY AS NEEDED FOR ITCHY EYES 2.5 mL 0  . phentermine (ADIPEX-P) 37.5 MG tablet Take 37.5 mg by mouth daily before breakfast.    . polyethylene glycol (MIRALAX / GLYCOLAX) packet Take 17 g by mouth every Monday, Wednesday, and Friday.    Marland Kitchen spironolactone (ALDACTONE) 25 MG tablet Take 25 mg by mouth daily.    Marland Kitchen telmisartan (MICARDIS) 40 MG tablet Take 40 mg by mouth daily.     No current facility-administered medications for this visit.     Allergies as of 07/13/2016  . (No Known Allergies)    Vitals: BP (!) 146/96   Pulse 96   Resp 20   Ht 5\' 7"  (1.702 m)   Wt 272 lb (123.4 kg)   BMI 42.60 kg/m  Last Weight:  Wt Readings from Last 1 Encounters:  07/13/16 272 lb (123.4 kg)   ZOX:WRUE mass index is 42.6 kg/m.     Last Height:   Ht Readings from Last 1 Encounters:  07/13/16 5\' 7"  (1.702 m)    Physical exam:  General: The patient is awake, alert and appears not in acute distress. The patient is well groomed. Head: Normocephalic, atraumatic. Neck is supple. Mallampati 5 neck circumference: 14.5 . Nasal airflow patent ,  Sinusitis, TMJ click evident .  Retrognathia is seen.  Cardiovascular:  Regular rate and rhythm, without  murmurs or carotid bruit, and without distended neck veins. Respiratory: Lungs are clear to auscultation. Skin:  Without evidence of edema, or rash Trunk: BMI is elevated .Neurologic exam : The patient is awake and alert, oriented to place and time.   Attention span & concentration ability appears normal.  Speech is  fluent,  without dysarthria, dysphonia or aphasia.  Mood and affect are appropriate.  Cranial nerves: Pupils are equal and briskly reactive to light. Funduscopic exam without  evidence of pallor or edema. Extraocular movements  in vertical and horizontal planes intact and without nystagmus. Visual fields by finger perimetry are intact. Hearing to finger rub intact.   Facial sensation intact to fine touch.  Facial motor strength is symmetric and tongue and uvula move midline. Shoulder shrug was symmetrical.   Motor exam:   Normal tone, muscle bulk and symmetric strength in all extremities.  Sensory:  Fine touch, pinprick and vibration -Proprioception tested in the upper extremities was normal.  Coordination: Rapid alternating movements ,Finger-to-nose maneuverwithout evidence of ataxia, dysmetria or tremor.  Gait and station: Patient walks without assistive device and is able unassisted to climb up to the exam table. Strength within normal limits.  Stance is stable and normal.  Deep tendon reflexes: in the  upper and lower extremities are symmetric and intact. Babinski maneuver response is downgoing.  The patient was advised of the nature of the diagnosed sleep disorder , the treatment options and risks for general a health and wellness arising from not treating the condition.  I spent more than 30 minutes of face to face time with the patient. Greater than 50% of time was spent in counseling and coordination of care. We have discussed the diagnosis and differential and I answered the patient's questions.      Assessment:  After physical and neurologic examination, review of laboratory studies,  Personal review of imaging studies, reports of other /same  Imaging studies ,  Results of polysomnography/ neurophysiology testing and pre-existing records as far as provided in visit., my assessment is   1)  Tracey Escobar does not have witnesses to her sleep pattern but has snort herself awake. She is also aware that his sleep is fragmented, she wakes up frequently not always sure why and the external stimulation by the TV may play a role in this. I will give her some guidance about sleep hygiene and sleep habits. I would like for her to continue going to bed at around 10 PM allowing a timeframe of about 8 hours before she has to go to work. If he could reduce caffeine intake to the morning hours only it will make a difference in how well she can sleep. I would also like for her to undergo a sleep study and I would like to screen her for sleep apnea. We will order a home sleep test. I further will advise her in weight loss.      Plan:  Treatment plan and additional workup :  HST, weight loss, sleep hygiene.      Tracey Mylararmen Jolonda Gomm MD  07/13/2016   CC: Dorothyann Pengobyn Sanders, Md [address] [place], [place] [id number]

## 2016-07-13 NOTE — Patient Instructions (Signed)

## 2016-08-06 ENCOUNTER — Encounter (INDEPENDENT_AMBULATORY_CARE_PROVIDER_SITE_OTHER): Payer: Commercial Managed Care - HMO | Admitting: Neurology

## 2016-08-06 DIAGNOSIS — F5101 Primary insomnia: Secondary | ICD-10-CM

## 2016-08-06 DIAGNOSIS — E6609 Other obesity due to excess calories: Secondary | ICD-10-CM

## 2016-08-06 DIAGNOSIS — G4710 Hypersomnia, unspecified: Secondary | ICD-10-CM | POA: Diagnosis not present

## 2016-08-06 DIAGNOSIS — R0683 Snoring: Secondary | ICD-10-CM

## 2016-08-13 ENCOUNTER — Telehealth: Payer: Self-pay

## 2016-08-13 NOTE — Telephone Encounter (Signed)
I called pt to discuss sleep study results.  No answer, left message asking the pt to call me back at their convenience.

## 2016-08-13 NOTE — Telephone Encounter (Signed)
I spoke to pt. I advised her that her sleep study was normal, per Dr. Vickey Hugerohmeier. Pt declined a follow up appt to discuss but asked that I send the HST report to Dr. Allyne GeeSanders, PCP. Pt verbalized understanding of results. Pt had no questions at this time but was encouraged to call back if questions arise.

## 2016-08-13 NOTE — Telephone Encounter (Signed)
Pt returned call

## 2016-08-13 NOTE — Telephone Encounter (Signed)
I called pt back, no answer, left a message asking her to call me back 

## 2016-09-02 ENCOUNTER — Encounter: Payer: Self-pay | Admitting: Allergy and Immunology

## 2016-09-02 ENCOUNTER — Ambulatory Visit (INDEPENDENT_AMBULATORY_CARE_PROVIDER_SITE_OTHER): Payer: Commercial Managed Care - HMO | Admitting: Allergy and Immunology

## 2016-09-02 VITALS — BP 114/80 | HR 96 | Temp 98.5°F | Resp 20

## 2016-09-02 DIAGNOSIS — J309 Allergic rhinitis, unspecified: Secondary | ICD-10-CM | POA: Diagnosis not present

## 2016-09-02 DIAGNOSIS — J019 Acute sinusitis, unspecified: Secondary | ICD-10-CM | POA: Insufficient documentation

## 2016-09-02 DIAGNOSIS — J0100 Acute maxillary sinusitis, unspecified: Secondary | ICD-10-CM | POA: Diagnosis not present

## 2016-09-02 DIAGNOSIS — H1010 Acute atopic conjunctivitis, unspecified eye: Secondary | ICD-10-CM

## 2016-09-02 DIAGNOSIS — J45901 Unspecified asthma with (acute) exacerbation: Secondary | ICD-10-CM | POA: Diagnosis not present

## 2016-09-02 MED ORDER — METHYLPREDNISOLONE ACETATE 80 MG/ML IJ SUSP
80.0000 mg | Freq: Once | INTRAMUSCULAR | Status: AC
  Administered 2016-09-02: 80 mg via INTRAMUSCULAR

## 2016-09-02 MED ORDER — FLUTICASONE-SALMETEROL 250-50 MCG/DOSE IN AEPB: 1.0000 | INHALATION_SPRAY | Freq: Two times a day (BID) | RESPIRATORY_TRACT | 5 refills | Status: DC

## 2016-09-02 MED ORDER — OLOPATADINE HCL 0.7 % OP SOLN: 1.0000 [drp] | Freq: Every day | OPHTHALMIC | 5 refills | Status: DC

## 2016-09-02 MED ORDER — MOMETASONE FUROATE 50 MCG/ACT NA SUSP: NASAL | 5 refills | Status: DC

## 2016-09-02 MED ORDER — AMOXICILLIN-POT CLAVULANATE 875-125 MG PO TABS: ORAL_TABLET | ORAL | 0 refills | Status: DC

## 2016-09-02 NOTE — Assessment & Plan Note (Signed)
   Depo-Medrol 80 mg was administered in the office.  Prednisone has been provided and is to be started tomorrow as follows: 20 mg daily x 4 days, 10 mg x1 day, then stop.  A prescription has been provided for Augmentin 875-125 mg, 1 by mouth twice a day 10 days.  A prescription prior authorization has been provided for Nasonex, one spray per nostril twice daily as needed.  Nasal saline lavage (NeilMed) has been recommended prior to medicated nasal sprays and as needed along with instructions for proper administration.  For thick post nasal drainage, add guaifenesin 1200 mg (Mucinex Maximum Strength)  twice daily as needed with adequate hydration as discussed.

## 2016-09-02 NOTE — Assessment & Plan Note (Signed)
   Continue appropriate allergen avoidance measures and levocetirizine 5 mg daily as needed.  A refill prescription has been provided for Nasonex (as above).  A refill prescription has been provided for Pazeo eyedrops.  Restart aeroallergen immunotherapy.

## 2016-09-02 NOTE — Assessment & Plan Note (Addendum)
   Depo-Medrol and prednisone have been provided (as above).  A sample and prescription have been provided for Advair 250/50, 1 inhalation twice a day.  To maximize pulmonary deposition, a spacer has been provided along with instructions for its proper administration with an HFA inhaler.  Continue albuterol HFA, 1-2 inhalations every 4-6 hours as needed.  The patient has been asked to contact me if her symptoms persist or progress. Otherwise, she may return for follow up in 4 months.

## 2016-09-02 NOTE — Patient Instructions (Addendum)
Acute sinusitis  Depo-Medrol 80 mg was administered in the office.  Prednisone has been provided and is to be started tomorrow as follows: 20 mg daily x 4 days, 10 mg x1 day, then stop.  A prescription has been provided for Augmentin 875-125 mg, 1 by mouth twice a day 10 days.  A prescription prior authorization has been provided for Nasonex, one spray per nostril twice daily as needed.  Nasal saline lavage (NeilMed) has been recommended prior to medicated nasal sprays and as needed along with instructions for proper administration.  For thick post nasal drainage, add guaifenesin 1200 mg (Mucinex Maximum Strength)  twice daily as needed with adequate hydration as discussed.  Asthma with acute exacerbation  Depo-Medrol and prednisone have been provided (as above).  A sample and prescription have been provided for Advair 250/50, 1 inhalation twice a day.  To maximize pulmonary deposition, a spacer has been provided along with instructions for its proper administration with an HFA inhaler.  Continue albuterol HFA, 1-2 inhalations every 4-6 hours as needed.  The patient has been asked to contact me if her symptoms persist or progress. Otherwise, she may return for follow up in 4 months.  Allergic rhinoconjunctivitis  Continue appropriate allergen avoidance measures and levocetirizine 5 mg daily as needed.  A refill prescription has been provided for Nasonex (as above).  A refill prescription has been provided for Pazeo eyedrops.  Restart aeroallergen immunotherapy.   Return in about 4 months (around 12/31/2016), or if symptoms worsen or fail to improve.

## 2016-09-02 NOTE — Progress Notes (Signed)
Follow-up Note  RE: Tracey Escobar MRN: [id number] DOB: 08/15/1971 Date of Office Visit: 09/02/2016  Primary care provider: Gwynneth AlimentSANDERS,ROBYN N, MD Referring provider: Dorothyann PengSanders, Robyn, MD  History of present illness: Tracey Escobar is a 45 y.o. female with persistent asthma and allergic rhinoconjunctivitis presenting today for sick visit.  She was last seen in this clinic in April 2016 by Dr. Willa RoughHicks who has since left the practice.  She reports that over the past week she has experienced sinus pressure over the forehead, between the eyes, and over the cheek bones, as well as nasal congestion, ear pressure, and postnasal drainage.  She states that these symptoms have progressively increased over the past week.  She has experienced fevers/chills over the past 24-48 hours.  In addition, she has been experiencing coughing and dyspnea.  She currently takes Dulera 100/5 g, 2 inhalations twice a day.  However, she states that Advair Diskus seemed to provide better relief.  She discontinued aeroallergen immunotherapy approximately one year ago but would like to restart injections to reduce symptoms and decrease medication requirement.    Assessment and plan: Acute sinusitis  Depo-Medrol 80 mg was administered in the office.  Prednisone has been provided and is to be started tomorrow as follows: 20 mg daily x 4 days, 10 mg x1 day, then stop.  A prescription has been provided for Augmentin 875-125 mg, 1 by mouth twice a day 10 days.  A prescription prior authorization has been provided for Nasonex, one spray per nostril twice daily as needed.  Nasal saline lavage (NeilMed) has been recommended prior to medicated nasal sprays and as needed along with instructions for proper administration.  For thick post nasal drainage, add guaifenesin 1200 mg (Mucinex Maximum Strength)  twice daily as needed with adequate hydration as discussed.  Asthma with acute exacerbation  Depo-Medrol and prednisone have  been provided (as above).  A sample and prescription have been provided for Advair 250/50, 1 inhalation twice a day.  To maximize pulmonary deposition, a spacer has been provided along with instructions for its proper administration with an HFA inhaler.  Continue albuterol HFA, 1-2 inhalations every 4-6 hours as needed.  The patient has been asked to contact me if her symptoms persist or progress. Otherwise, she may return for follow up in 4 months.  Allergic rhinoconjunctivitis  Continue appropriate allergen avoidance measures and levocetirizine 5 mg daily as needed.  A refill prescription has been provided for Nasonex (as above).  A refill prescription has been provided for Pazeo eyedrops.  Restart aeroallergen immunotherapy.   Meds ordered this encounter  Medications  . methylPREDNISolone acetate (DEPO-MEDROL) injection 80 mg  . amoxicillin-clavulanate (AUGMENTIN) 875-125 MG tablet    Sig: Take one tablet by mouth twice daily for 10 days    Dispense:  20 tablet    Refill:  0    For infection  . mometasone (NASONEX) 50 MCG/ACT nasal spray    Sig: One spray per nostril twice daily    Dispense:  17 g    Refill:  5    For stuffy nose  . Olopatadine HCl (PAZEO) 0.7 % SOLN    Sig: Place 1 drop into both eyes daily.    Dispense:  1 Bottle    Refill:  5    For itchy eyes  . Fluticasone-Salmeterol (ADVAIR DISKUS) 250-50 MCG/DOSE AEPB    Sig: Inhale 1 puff into the lungs 2 (two) times daily.    Dispense:  60 each  Refill:  5    To prevent cough or wheeze    Diagnostics: Spirometry reveals an FVC of 2.37 L and an FEV1 of 1.96 L (73% predicted) without significant postbronchodilator improvement.  Please see scanned spirometry results for details.    Physical examination: Blood pressure 114/80, pulse 96, temperature 98.5 F (36.9 C), temperature source Oral, resp. rate 20.  General: Alert, interactive, in no acute distress. HEENT: TMs pearly gray, turbinates edematous  with thick discharge, post-pharynx erythematous. Neck: Supple without lymphadenopathy. Lungs: Mildly decreased breath sounds bilaterally without wheezing, rhonchi or rales. CV: Normal S1, S2 without murmurs. Skin: Warm and dry, without lesions or rashes.  The following portions of the patient's history were reviewed and updated as appropriate: allergies, current medications, past family history, past medical history, past social history, past surgical history and problem list.    Medication List       Accurate as of 09/02/16  6:23 PM. Always use your most recent med list.          amoxicillin-clavulanate 875-125 MG tablet Commonly known as:  AUGMENTIN Take one tablet by mouth twice daily for 10 days   calcium carbonate 1250 (500 Ca) MG tablet Commonly known as:  OS-CAL - dosed in mg of elemental calcium Take 1 tablet by mouth daily with breakfast.   cycloSPORINE 0.05 % ophthalmic emulsion Commonly known as:  RESTASIS Place 1 drop into both eyes 2 (two) times daily.   Fluticasone-Salmeterol 250-50 MCG/DOSE Aepb Commonly known as:  ADVAIR DISKUS Inhale 1 puff into the lungs 2 (two) times daily.   levocetirizine 5 MG tablet Commonly known as:  XYZAL Take 5 mg by mouth daily.   LINZESS PO Take by mouth 1 day or 1 dose.   mometasone 50 MCG/ACT nasal spray Commonly known as:  NASONEX One spray per nostril twice daily   mometasone-formoterol 100-5 MCG/ACT Aero Commonly known as:  DULERA Inhale 2 puffs into the lungs daily.   PAZEO 0.7 % Soln Generic drug:  Olopatadine HCl INSTILL 1 DROP INTO BOTH EYES EVERY DAY AS NEEDED FOR ITCHY EYES   Olopatadine HCl 0.7 % Soln Commonly known as:  PAZEO Place 1 drop into both eyes daily.   phentermine 37.5 MG tablet Commonly known as:  ADIPEX-P Take 37.5 mg by mouth daily before breakfast.   polyethylene glycol packet Commonly known as:  MIRALAX / GLYCOLAX Take 17 g by mouth every Monday, Wednesday, and Friday.     spironolactone 25 MG tablet Commonly known as:  ALDACTONE Take 25 mg by mouth daily.   telmisartan 40 MG tablet Commonly known as:  MICARDIS Take 40 mg by mouth daily.   VENTOLIN HFA 108 (90 Base) MCG/ACT inhaler Generic drug:  albuterol Inhale 2 puffs into the lungs every 6 (six) hours as needed for wheezing or shortness of breath.   Vitamin D3 5000 units Tabs Take 1 tablet by mouth daily.       No Known Allergies  Review of systems: Review of systems negative except as noted in HPI / PMHx or noted below: Constitutional: Negative.  HENT: Negative.   Eyes: Negative.  Respiratory: Negative.   Cardiovascular: Negative.  Gastrointestinal: Negative.  Genitourinary: Negative.  Musculoskeletal: Negative.  Neurological: Negative.  Endo/Heme/Allergies: Negative.  Cutaneous: Negative.  Past Medical History:  Diagnosis Date  . Arthritis   . Asthma   . GERD (gastroesophageal reflux disease)   . Hypertension   . Irritable bowel   . Vitamin D deficiency  History reviewed. No pertinent family history.  Social History   Social History  . Marital status: Single    Spouse name: N/A  . Number of children: N/A  . Years of education: N/A   Occupational History  . Not on file.   Social History Main Topics  . Smoking status: Never Smoker  . Smokeless tobacco: Never Used  . Alcohol use No  . Drug use: No  . Sexual activity: Not on file   Other Topics Concern  . Not on file   Social History Narrative  . No narrative on file    I appreciate the opportunity to take part in Sanvi's care. Please do not hesitate to contact me with questions.  Sincerely,   R. Jorene Guestarter Marlisa Caridi, MD

## 2016-09-07 ENCOUNTER — Telehealth: Payer: Self-pay

## 2016-09-07 ENCOUNTER — Telehealth: Payer: Self-pay | Admitting: Allergy

## 2016-09-07 ENCOUNTER — Other Ambulatory Visit: Payer: Self-pay | Admitting: Allergy

## 2016-09-07 MED ORDER — ALCAFTADINE 0.25 % OP SOLN: OPHTHALMIC | 3 refills | Status: DC

## 2016-09-07 NOTE — Telephone Encounter (Signed)
na

## 2016-09-07 NOTE — Telephone Encounter (Signed)
Insurance will not pay for Pazeo 0.7%.  Alternatives include:  OTC Zaditor, generic Optivar, generic Patanol or Lastacaft.  Needs strenth, directions, quantity and refills.

## 2016-09-07 NOTE — Telephone Encounter (Signed)
Lastacaft, 1 ggt OU qday prn. 1 bottle with 3 refills.

## 2016-09-07 NOTE — Telephone Encounter (Signed)
Called in rx. For Lastacrat. And informed patient.

## 2016-09-08 ENCOUNTER — Other Ambulatory Visit: Payer: Self-pay

## 2016-09-08 MED ORDER — FLUTICASONE PROPIONATE 50 MCG/ACT NA SUSP: 1.0000 | Freq: Two times a day (BID) | NASAL | 5 refills | Status: DC | PRN

## 2016-09-10 DIAGNOSIS — J301 Allergic rhinitis due to pollen: Secondary | ICD-10-CM | POA: Diagnosis not present

## 2016-09-10 NOTE — Addendum Note (Signed)
Addended by: Clydia LlanoMCDOWELL, Brier Reid H on: 09/10/2016 09:27 AM   Modules accepted: Orders

## 2016-09-10 NOTE — Progress Notes (Signed)
Vials to be made 09-10-16.  jm

## 2016-09-11 DIAGNOSIS — J3089 Other allergic rhinitis: Secondary | ICD-10-CM | POA: Diagnosis not present

## 2016-09-14 NOTE — Addendum Note (Signed)
Addended byClyda Greener: Marilyn Nihiser M on: 09/14/2016 10:58 AM   Modules accepted: Orders

## 2016-09-17 ENCOUNTER — Ambulatory Visit (INDEPENDENT_AMBULATORY_CARE_PROVIDER_SITE_OTHER): Payer: Commercial Managed Care - HMO

## 2016-09-17 DIAGNOSIS — J309 Allergic rhinitis, unspecified: Secondary | ICD-10-CM

## 2016-09-17 NOTE — Progress Notes (Signed)
Immunotherapy   Patient Details  Name: Towana Badgerngela F Vercher MRN: [id number] Date of Birth: 12/02/1970  09/17/2016 Towana BadgerAngela F Piekarski started injections for   Blue 1:100,000 ([place] and [place]etta [place]) Following schedule: A  Frequency:1 time per week Epi-Pen:Epi-Pen Available  Consent signed and patient instructions given.   Virl SonDamita Hallee Mckenny 09/17/2016, 8:44 AM

## 2016-10-07 ENCOUNTER — Ambulatory Visit (INDEPENDENT_AMBULATORY_CARE_PROVIDER_SITE_OTHER): Payer: Commercial Managed Care - HMO | Admitting: *Deleted

## 2016-10-07 DIAGNOSIS — J309 Allergic rhinitis, unspecified: Secondary | ICD-10-CM | POA: Diagnosis not present

## 2016-10-08 ENCOUNTER — Other Ambulatory Visit: Payer: Self-pay | Admitting: Allergy

## 2016-10-08 MED ORDER — EPINEPHRINE 0.3 MG/0.3ML IJ SOAJ: INTRAMUSCULAR | 1 refills | Status: DC

## 2016-10-13 ENCOUNTER — Ambulatory Visit (INDEPENDENT_AMBULATORY_CARE_PROVIDER_SITE_OTHER): Payer: Commercial Managed Care - HMO

## 2016-10-13 DIAGNOSIS — J309 Allergic rhinitis, unspecified: Secondary | ICD-10-CM | POA: Diagnosis not present

## 2016-10-15 ENCOUNTER — Ambulatory Visit (INDEPENDENT_AMBULATORY_CARE_PROVIDER_SITE_OTHER): Payer: Commercial Managed Care - HMO

## 2016-10-15 DIAGNOSIS — J309 Allergic rhinitis, unspecified: Secondary | ICD-10-CM | POA: Diagnosis not present

## 2016-10-27 ENCOUNTER — Ambulatory Visit (INDEPENDENT_AMBULATORY_CARE_PROVIDER_SITE_OTHER): Payer: Commercial Managed Care - HMO

## 2016-10-27 DIAGNOSIS — J309 Allergic rhinitis, unspecified: Secondary | ICD-10-CM | POA: Diagnosis not present

## 2016-11-05 ENCOUNTER — Ambulatory Visit (INDEPENDENT_AMBULATORY_CARE_PROVIDER_SITE_OTHER): Payer: Commercial Managed Care - HMO

## 2016-11-05 DIAGNOSIS — J309 Allergic rhinitis, unspecified: Secondary | ICD-10-CM

## 2016-11-17 ENCOUNTER — Ambulatory Visit (INDEPENDENT_AMBULATORY_CARE_PROVIDER_SITE_OTHER): Payer: Commercial Managed Care - HMO

## 2016-11-17 DIAGNOSIS — J309 Allergic rhinitis, unspecified: Secondary | ICD-10-CM | POA: Diagnosis not present

## 2016-11-24 ENCOUNTER — Ambulatory Visit (INDEPENDENT_AMBULATORY_CARE_PROVIDER_SITE_OTHER): Payer: Commercial Managed Care - HMO

## 2016-11-24 DIAGNOSIS — J309 Allergic rhinitis, unspecified: Secondary | ICD-10-CM | POA: Diagnosis not present

## 2016-12-08 ENCOUNTER — Ambulatory Visit (INDEPENDENT_AMBULATORY_CARE_PROVIDER_SITE_OTHER): Payer: Commercial Managed Care - HMO

## 2016-12-08 DIAGNOSIS — J309 Allergic rhinitis, unspecified: Secondary | ICD-10-CM

## 2016-12-10 ENCOUNTER — Telehealth: Payer: Self-pay

## 2016-12-10 NOTE — Telephone Encounter (Signed)
Spoke to pt. Pt. Stating she has noticed here in the last 2 to 3 weeks having trouble breathing more since the weather change. Having some shortness of breath and using her rescue inhaler more. Scheduled her appointment with Dr. Dellis AnesGallagher tomorrow at 11:15 a.m.

## 2016-12-11 ENCOUNTER — Ambulatory Visit (INDEPENDENT_AMBULATORY_CARE_PROVIDER_SITE_OTHER): Payer: Commercial Managed Care - HMO | Admitting: Allergy & Immunology

## 2016-12-11 ENCOUNTER — Encounter: Payer: Self-pay | Admitting: Allergy & Immunology

## 2016-12-11 VITALS — BP 120/70 | HR 86 | Temp 98.3°F | Resp 16

## 2016-12-11 DIAGNOSIS — J3089 Other allergic rhinitis: Secondary | ICD-10-CM | POA: Diagnosis not present

## 2016-12-11 DIAGNOSIS — K219 Gastro-esophageal reflux disease without esophagitis: Secondary | ICD-10-CM

## 2016-12-11 DIAGNOSIS — J309 Allergic rhinitis, unspecified: Secondary | ICD-10-CM | POA: Diagnosis not present

## 2016-12-11 DIAGNOSIS — J4540 Moderate persistent asthma, uncomplicated: Secondary | ICD-10-CM

## 2016-12-11 MED ORDER — PANTOPRAZOLE SODIUM 40 MG PO TBEC: 40.0000 mg | DELAYED_RELEASE_TABLET | Freq: Every day | ORAL | 5 refills | Status: DC

## 2016-12-11 MED ORDER — AZELASTINE HCL 0.1 % NA SOLN: 1.0000 | Freq: Two times a day (BID) | NASAL | 5 refills | Status: DC

## 2016-12-11 MED ORDER — FLUTICASONE-SALMETEROL 500-50 MCG/DOSE IN AEPB
1.0000 | INHALATION_SPRAY | Freq: Two times a day (BID) | RESPIRATORY_TRACT | 5 refills | Status: DC
Start: 2016-12-11 — End: 2017-09-08

## 2016-12-11 NOTE — Progress Notes (Signed)
FOLLOW UP  Date of Service/Encounter:  12/11/16   Assessment:   Moderate persistent asthma, uncomplicated  Perennial allergic rhinitis  Gastroesophageal reflux disease   Asthma Reportables:  Severity: moderate persistent  Risk: low Control: not well controlled    Plan/Recommendations:   1. Moderate persistent asthma, uncomplicated - with recent transient episodes of chest tightness - Lung testing was stable today. - We will increase your Advair to 500/50 one inhalation once daily. - Deferred treatment with prednisone and nebulizer treatment today since the lung function was unchanged from the last visit and there  - She is followed by Cardiology which is reassuring.  2. Perennial allergic rhinitis - Continue with allergy shots at the same schedule.  - Continue with Flonase two sprays per nostril daily. - Add Astelin two sprays per nostril 1-2 times daily.  - Continue with Xyzal 5mg  daily.   3. Gastroesophageal reflux disease - Stop the omeprazole and change to pantoprazole 40mg  once daily. - Hopefully this will help that midline chest pain and discomfort.   4. Return in about 3 months (around 03/13/2017).   Subjective:   Tracey Escobar is a 46 y.o. female presenting today for follow up of  Chief Complaint  Patient presents with  . Asthma    head congestion, S.O.B., chest tightness x 2 weeks    Tracey Escobar has a history of the following: Patient Active Problem List   Diagnosis Date Noted  . Acute sinusitis 09/02/2016  . Asthma with acute exacerbation 06/11/2015  . Allergic rhinoconjunctivitis 06/11/2015  . GERD (gastroesophageal reflux disease) 06/11/2015    History obtained from: chart review and the patient.  Tracey BadgerAngela F Tackett was referred by Gwynneth Alimentobyn N Sanders, MD.     Tracey Escobar is a 46 y.o. female presenting for a sick visit. She was last seen in November 2017 by Dr. Nunzio CobbsBobbitt. At that time, she was endorsing sinus pressure as well as nasal congestion  and ear pressure. She was given intramuscular steroid in the office as well as a prednisone burst in addition to an Augmentin course. She was continued on Nasonex one spray per nostril twice daily as needed. She was also diagnosed with an asthma exacerbation. She was advanced to Advair 250/50 one inhalation twice daily. It is recommended that she restart her allergen immunotherapy, which has been done. She currently receives 2 injections. One injection contains dust mites alone and the other contains grass and tree. She is in the blue vial and receives 0.4 mL of each.  Since the last visit, she has mostly done well. However over the last two weeks she has developed worsening shortness of breath despite her medications. She continues on the Advair 250/50 one puff twice daily. She endorses tightness and a "flutter" in the middle of her chest. It feels that she is taking her albuterol with the tachycardia but only in the middle of the chest. This happens around twice daily, lasting five minutes. This does not seem to be associated with meal times. She does have GERD and takes omeprazole for this. Albuterol does help with this. Of note she has been on the Advair 500/50 which she felt worked better.   [name] remains on her immunotherapy and tolerates this well. She is on Flonase two sprays per nostril daily. She is on an antihistamine as well Xyzal one tablet daily.   Of note, within the last year she did see a Cardiologist due to her cardiac family history and swelling in her hands  and feet. Everything checked. She did an EKG as well as a stress test with a couple of follow up appointments.   Otherwise, there have been no changes to her past medical history, surgical history, family history, or social history.    Review of Systems: a 14-point review of systems is pertinent for what is mentioned in HPI.  Otherwise, all other systems were negative. Constitutional: negative other than that listed in the  HPI Eyes: negative other than that listed in the HPI Ears, nose, mouth, throat, and face: negative other than that listed in the HPI Respiratory: negative other than that listed in the HPI Cardiovascular: negative other than that listed in the HPI Gastrointestinal: negative other than that listed in the HPI Genitourinary: negative other than that listed in the HPI Integument: negative other than that listed in the HPI Hematologic: negative other than that listed in the HPI Musculoskeletal: negative other than that listed in the HPI Neurological: negative other than that listed in the HPI Allergy/Immunologic: negative other than that listed in the HPI    Objective:   Blood pressure 120/70, pulse 86, temperature 98.3 F (36.8 C), temperature source Oral, resp. rate 16. There is no height or weight on file to calculate BMI.   Physical Exam:  General: Alert, interactive, in no acute distress. Pleasant obese female. Eyes: No conjunctival injection present on the right, No conjunctival injection present on the left, PERRL bilaterally, No discharge on the right, No discharge on the left and No Horner-Trantas dots present Ears: Right TM pearly gray with normal light reflex, Left TM pearly gray with normal light reflex, Right TM intact without perforation and Left TM intact without perforation.  Nose/Throat: External nose within normal limits and septum midline, turbinates markedly edematous and pale with clear discharge, post-pharynx mildly erythematous without cobblestoning in the posterior oropharynx. Tonsils 2+ without exudates Neck: Supple without thyromegaly. Lungs: Clear to auscultation without wheezing, rhonchi or rales. No increased work of breathing. CV: Normal S1/S2, no murmurs. Capillary refill <2 seconds.  Skin: Warm and dry, without lesions or rashes. Neuro:   Grossly intact. No focal deficits appreciated. Responsive to questions.   Diagnostic studies:  Spirometry: results  normal (FEV1: 1.84/69%, FVC: 2.30/69%, FEV1/FVC: 80%).    Spirometry consistent with possible restrictive disease. Compared with her values obtained at the last visit, they have remained stable.    Allergy Studies: None     Malachi Bonds, MD [facility] [place]

## 2016-12-11 NOTE — Patient Instructions (Addendum)
1. Moderate persistent asthma, uncomplicated - Lung testing was stable today. - We will increase your Advair to 500/50 one inhalation once daily  2. Perennial allergic rhinitis - Continue with allergy shots. - Continue with Flonase two sprays per nostril daily. - Add Astelin two sprays per nostril 1-2 times daily.  - Continue with Xyzal 5mg  daily.   3. Gastroesophageal reflux disease - Stop the omeprazole and change to pantoprazole 40mg  once daily. - Hopefully this will help that midline chest pain and discomfort.   4. Return in about 3 months (around 03/13/2017).  Please inform us of any Emergency Department visits, hospitalizations, or changes in symptoms. Call us before going to the ED for breathing or allergy symptoms since we might be able to fit you in for a sick visit. Feel free to contact us anytime with any questions, problems, or concerns.  It was a pleasure to meet you today! Happy spring!   Websites that have reliable patient information: 1. American Academy of Asthma, Allergy, and Immunology: www.aaaai.org 2. Food Allergy Research and Education (FARE): foodallergy.org 3. Mothers of Asthmatics: http://www.asthmacommunitynetwork.org 4. American College of Allergy, Asthma, and Immunology: www.acaai.org

## 2016-12-15 DIAGNOSIS — J4540 Moderate persistent asthma, uncomplicated: Secondary | ICD-10-CM | POA: Insufficient documentation

## 2016-12-15 DIAGNOSIS — I4891 Unspecified atrial fibrillation: Secondary | ICD-10-CM | POA: Insufficient documentation

## 2016-12-15 DIAGNOSIS — Z6841 Body Mass Index (BMI) 40.0 and over, adult: Secondary | ICD-10-CM

## 2016-12-15 HISTORY — DX: Unspecified atrial fibrillation: I48.91

## 2017-02-04 ENCOUNTER — Ambulatory Visit (INDEPENDENT_AMBULATORY_CARE_PROVIDER_SITE_OTHER): Payer: Commercial Managed Care - HMO

## 2017-02-04 DIAGNOSIS — J309 Allergic rhinitis, unspecified: Secondary | ICD-10-CM | POA: Diagnosis not present

## 2017-02-09 ENCOUNTER — Ambulatory Visit (INDEPENDENT_AMBULATORY_CARE_PROVIDER_SITE_OTHER): Payer: Commercial Managed Care - HMO

## 2017-02-09 DIAGNOSIS — J309 Allergic rhinitis, unspecified: Secondary | ICD-10-CM | POA: Diagnosis not present

## 2017-02-12 ENCOUNTER — Ambulatory Visit (INDEPENDENT_AMBULATORY_CARE_PROVIDER_SITE_OTHER): Payer: Commercial Managed Care - HMO

## 2017-02-12 ENCOUNTER — Telehealth: Payer: Self-pay

## 2017-02-12 DIAGNOSIS — J309 Allergic rhinitis, unspecified: Secondary | ICD-10-CM | POA: Diagnosis not present

## 2017-02-12 MED ORDER — OLOPATADINE HCL 0.7 % OP SOLN: 1.0000 [drp] | Freq: Every day | OPHTHALMIC | 5 refills | Status: DC

## 2017-02-12 NOTE — Telephone Encounter (Signed)
Spoke to candace at [facility] [place] to let her know pt's pazeo was approved and will fax the approved paperwork.

## 2017-02-12 NOTE — Telephone Encounter (Signed)
Pt. Was in for her allergy injections and stated non of the over the counter/rx eye drops were helping. Pt,. Stated she needed a PA for the pazeo. Lyla SonCarrie got an approval for her Pazeo. Pt. Is aware and will call/fax approval to Pt.'s pharmacy.

## 2017-02-16 ENCOUNTER — Ambulatory Visit (INDEPENDENT_AMBULATORY_CARE_PROVIDER_SITE_OTHER): Payer: Commercial Managed Care - HMO

## 2017-02-16 DIAGNOSIS — J309 Allergic rhinitis, unspecified: Secondary | ICD-10-CM | POA: Diagnosis not present

## 2017-02-18 ENCOUNTER — Ambulatory Visit (INDEPENDENT_AMBULATORY_CARE_PROVIDER_SITE_OTHER): Payer: Commercial Managed Care - HMO | Admitting: *Deleted

## 2017-02-18 DIAGNOSIS — J309 Allergic rhinitis, unspecified: Secondary | ICD-10-CM | POA: Diagnosis not present

## 2017-03-12 ENCOUNTER — Ambulatory Visit: Payer: Commercial Managed Care - HMO | Admitting: Allergy & Immunology

## 2017-04-12 ENCOUNTER — Telehealth: Payer: Self-pay

## 2017-04-12 ENCOUNTER — Other Ambulatory Visit: Payer: Self-pay | Admitting: Allergy

## 2017-04-12 MED ORDER — CICLESONIDE 37 MCG/ACT NA AERS: INHALATION_SPRAY | NASAL | 5 refills | Status: DC

## 2017-04-12 NOTE — Telephone Encounter (Signed)
Left message for patient to call office.Sent in [place]

## 2017-04-12 NOTE — Telephone Encounter (Signed)
Insurance denying Nasonex (generic mometasone).  Patient must try five of the following preferred meds:  History of failure, contraindication or intolerance to FIVE of the following: flunisolide (generic Nasarel), fluticasone (generic Flonase), Zetonna, Nasacort OTC, Flonase OTC, Rhinocort OTC?  Patient has tried fluticasone and mometasone per chart.  Please advise

## 2017-04-12 NOTE — Telephone Encounter (Signed)
I'm surprised they will cover Zetonna before Nasonex, but we will jump through their hoops. Please send in [place] one puff per nostril daily and let the patient know.  Thanks, Malachi BondsJoel Terisha Losasso, MD FAAAAI [facility] [place]

## 2017-04-12 NOTE — Telephone Encounter (Signed)
Left message for patient to call office.Sent in Zetonna 

## 2017-04-13 NOTE — Telephone Encounter (Signed)
Informed patient that we faxed in [facility].

## 2017-05-06 ENCOUNTER — Ambulatory Visit (INDEPENDENT_AMBULATORY_CARE_PROVIDER_SITE_OTHER): Payer: 59 | Admitting: Allergy and Immunology

## 2017-05-06 ENCOUNTER — Encounter: Payer: Self-pay | Admitting: Allergy and Immunology

## 2017-05-06 VITALS — BP 124/82 | HR 93 | Temp 98.2°F | Resp 20

## 2017-05-06 DIAGNOSIS — J0100 Acute maxillary sinusitis, unspecified: Secondary | ICD-10-CM

## 2017-05-06 DIAGNOSIS — H1010 Acute atopic conjunctivitis, unspecified eye: Secondary | ICD-10-CM | POA: Diagnosis not present

## 2017-05-06 DIAGNOSIS — J309 Allergic rhinitis, unspecified: Secondary | ICD-10-CM | POA: Diagnosis not present

## 2017-05-06 DIAGNOSIS — J45901 Unspecified asthma with (acute) exacerbation: Secondary | ICD-10-CM

## 2017-05-06 MED ORDER — ALBUTEROL SULFATE HFA 108 (90 BASE) MCG/ACT IN AERS: 2.0000 | INHALATION_SPRAY | RESPIRATORY_TRACT | 1 refills | Status: DC | PRN

## 2017-05-06 MED ORDER — FLUTICASONE-SALMETEROL 230-21 MCG/ACT IN AERO: INHALATION_SPRAY | RESPIRATORY_TRACT | 5 refills | Status: DC

## 2017-05-06 MED ORDER — MONTELUKAST SODIUM 10 MG PO TABS: ORAL_TABLET | ORAL | 5 refills | Status: DC

## 2017-05-06 MED ORDER — MOMETASONE FUROATE 50 MCG/ACT NA SUSP: NASAL | 5 refills | Status: DC

## 2017-05-06 MED ORDER — PREDNISONE 1 MG PO TABS: 10.0000 mg | ORAL_TABLET | Freq: Every day | ORAL | Status: AC

## 2017-05-06 NOTE — Assessment & Plan Note (Signed)
   Continue appropriate allergen avoidance measures.  A refill prescription has been provided for Nasonex and nasal saline irrigation (as above).  Continue olopatadine eyedrops as needed.  Restart aeroallergen immunotherapy after this asthma exacerbation has resolved.

## 2017-05-06 NOTE — Progress Notes (Signed)
Follow-up Note  RE: Towana Badgerngela F Gautreau MRN: [id number] DOB: 12/26/1970 Date of Office Visit: 05/06/2017  Primary care provider: Dorothyann PengSanders, Robyn, MD Referring provider: Dorothyann PengSanders, Robyn, MD  History of present illness: Tracey Escobar is a 46 y.o. female with persistent asthma, allergic rhinitis, and gastroesophageal reflux disease presenting today for sick visit.  She was last seen in this clinic on 12/21/2016.  She is currently taking Advair 500/50 g, one inhalation twice a day.  She states that this regimen initially reduced her symptoms, however currently she is experiencing frequent asthma symptoms even with mild exertion, such as one flight of stairs.  She is experiencing asthma symptoms at least 1 time daily and experiences nocturnal awakenings due to lower rest or symptoms 3 nights per week on average.  She has also been experiencing rhinorrhea and ear pressure over the past 2 weeks.  She has not been experiencing fevers or discolored mucus production.  Her last immunotherapy injection was on 02/18/2017 area and she had to discontinue at that time because of her work schedule, however she would like to restart as soon as possible.   Assessment and plan: Asthma with acute exacerbation  A laboratory order form has been provided for CBC with differential and total IgE to assess candidacy for biologic agents.  Prednisone has been provided, 20 mg x 4 days, 10 mg x1 day, then stop.  The prednisone is not to be started until after she has had her labs drawn.  We will switch from dry powder inhaler to [facility].  A prescription has been provided for Advair 230/21 g, 2 inhalations twice a day. To maximize pulmonary deposition, a spacer has been provided along with instructions for its proper administration with an HFA inhaler.  A prescription has been provided for montelukast 10 mg daily at bedtime.  Continue albuterol HFA, 1-2 inhalations every 4-6 hours as needed.  The patient has been asked to  contact me if her symptoms persist or progress. Otherwise, she may return for follow up in 4 months.  Acute sinusitis  Prednisone has been provided (as above).    Continue nasal saline irrigation and Nasonex.  Nasal saline lavage (NeilMed) has been recommended prior to medicated nasal sprays and as needed along with instructions for proper administration.  For thick post nasal drainage, add guaifenesin 1200 mg (Mucinex Maximum Strength)  twice daily as needed with adequate hydration as discussed.  Allergic rhinoconjunctivitis  Continue appropriate allergen avoidance measures.  A refill prescription has been provided for Nasonex and nasal saline irrigation (as above).  Continue olopatadine eyedrops as needed.  Restart aeroallergen immunotherapy after this asthma exacerbation has resolved.   Meds ordered this encounter  Medications  . fluticasone-salmeterol (ADVAIR HFA) 230-21 MCG/ACT inhaler    Sig: 2 puffs twice daily use with spacer to prevent coughing or wheezing.    Dispense:  1 Inhaler    Refill:  5  . montelukast (SINGULAIR) 10 MG tablet    Sig: Take 1 tablet by mouth each evening for coughing or wheezing.    Dispense:  34 tablet    Refill:  5  . mometasone (NASONEX) 50 MCG/ACT nasal spray    Sig: 2 sprays once daily if needed for stuffy nose.    Dispense:  17 g    Refill:  5    Please put rx on file. Pt. Will call when needed.  Marland Kitchen. albuterol (VENTOLIN HFA) 108 (90 Base) MCG/ACT inhaler    Sig: Inhale 2 puffs into the lungs  every 4 (four) hours as needed for wheezing or shortness of breath.    Dispense:  1 Inhaler    Refill:  1    Please keep rx on file. Pt. Will call when needed.  . predniSONE (DELTASONE) tablet 10 mg    Diagnostics: Spirometry reveals an FVC of 2.16 L and an FEV1 of 1.73 L (65% predicted) without post bronchodilator improvement.  Please see scanned spirometry results for details.    Physical examination: Blood pressure 124/82, pulse 93,  temperature 98.2 F (36.8 C), temperature source Oral, resp. rate 20, SpO2 97 %.  General: Alert, interactive, in no acute distress. HEENT: TMs pearly gray, turbinates moderately edematous without discharge, post-pharynx mildly erythematous. Neck: Supple without lymphadenopathy. Lungs: Mildly decreased breath sounds bilaterally without wheezing, rhonchi or rales. CV: Normal S1, S2 without murmurs. Skin: Warm and dry, without lesions or rashes.  The following portions of the patient's history were reviewed and updated as appropriate: allergies, current medications, past family history, past medical history, past social history, past surgical history and problem list.  Allergies as of 05/06/2017   No Known Allergies     Medication List       Accurate as of 05/06/17  7:12 PM. Always use your most recent med list.          Alcaftadine 0.25 % Soln Commonly known as:  LASTACAFT USE ONE DROP EACH EYE ONCE DAILY IF NEEDED   amoxicillin-clavulanate 875-125 MG tablet Commonly known as:  AUGMENTIN Take one tablet by mouth twice daily for 10 days   azelastine 0.1 % nasal spray Commonly known as:  ASTELIN Place 1 spray into both nostrils 2 (two) times daily. Use in each nostril as directed   calcium carbonate 1250 (500 Ca) MG tablet Commonly known as:  OS-CAL - dosed in mg of elemental calcium Take 1 tablet by mouth daily with breakfast.   Ciclesonide 37 MCG/ACT Aers Commonly known as:  ZETONNA One spray each nostril daily.   cycloSPORINE 0.05 % ophthalmic emulsion Commonly known as:  RESTASIS Place 1 drop into both eyes 2 (two) times daily.   EPINEPHrine 0.3 mg/0.3 mL Soaj injection Commonly known as:  EPI-PEN Use as directed for anaphylaxis   fluticasone 50 MCG/ACT nasal spray Commonly known as:  FLONASE Place 1 spray into both nostrils 2 (two) times daily as needed for allergies or rhinitis.   Fluticasone-Salmeterol 250-50 MCG/DOSE Aepb Commonly known as:  ADVAIR  DISKUS Inhale 1 puff into the lungs 2 (two) times daily.   Fluticasone-Salmeterol 500-50 MCG/DOSE Aepb Commonly known as:  ADVAIR DISKUS Inhale 1 puff into the lungs 2 (two) times daily. Rinse, gargle and spit out after use   fluticasone-salmeterol 230-21 MCG/ACT inhaler Commonly known as:  ADVAIR HFA 2 puffs twice daily use with spacer to prevent coughing or wheezing.   levocetirizine 5 MG tablet Commonly known as:  XYZAL Take 5 mg by mouth daily.   LINZESS PO Take by mouth 1 day or 1 dose.   losartan 25 MG tablet Commonly known as:  COZAAR Take by mouth daily.   MedroxyPROGESTERone Acetate 150 MG/ML Susy every 3 (three) months.   metoprolol succinate 100 MG 24 hr tablet Commonly known as:  TOPROL-XL Take by mouth.   mometasone 50 MCG/ACT nasal spray Commonly known as:  NASONEX 2 sprays by Both Nostrils route daily.   mometasone 50 MCG/ACT nasal spray Commonly known as:  NASONEX 2 sprays once daily if needed for stuffy nose.   mometasone-formoterol 100-5 MCG/ACT  Aero Commonly known as:  DULERA Inhale 2 puffs into the lungs daily.   montelukast 10 MG tablet Commonly known as:  SINGULAIR Take 1 tablet by mouth each evening for coughing or wheezing.   Olopatadine HCl 0.7 % Soln Commonly known as:  PAZEO Place 1 drop into both eyes daily.   pantoprazole 40 MG tablet Commonly known as:  PROTONIX Take 1 tablet (40 mg total) by mouth daily.   phentermine 37.5 MG tablet Commonly known as:  ADIPEX-P Take 37.5 mg by mouth daily before breakfast.   polyethylene glycol packet Commonly known as:  MIRALAX / GLYCOLAX Take 17 g by mouth every Monday, Wednesday, and Friday.   rivaroxaban 20 MG Tabs tablet Commonly known as:  XARELTO Take by mouth.   spironolactone 25 MG tablet Commonly known as:  ALDACTONE Take 25 mg by mouth daily.   telmisartan 40 MG tablet Commonly known as:  MICARDIS Take 40 mg by mouth daily.   THERA Tabs Take by mouth.   UNABLE TO  FIND Med Name: immunotherapy   VENTOLIN HFA 108 (90 Base) MCG/ACT inhaler Generic drug:  albuterol Inhale 2 puffs into the lungs every 6 (six) hours as needed for wheezing or shortness of breath.   albuterol 108 (90 Base) MCG/ACT inhaler Commonly known as:  VENTOLIN HFA Inhale 2 puffs into the lungs every 4 (four) hours as needed for wheezing or shortness of breath.   Vitamin D3 5000 units Tabs Take 1 tablet by mouth daily.       No Known Allergies  Review of systems: Review of systems negative except as noted in HPI / PMHx or noted below: Constitutional: Negative.  HENT: Negative.   Eyes: Negative.  Respiratory: Negative.   Cardiovascular: Negative.  Gastrointestinal: Negative.  Genitourinary: Negative.  Musculoskeletal: Negative.  Neurological: Negative.  Endo/Heme/Allergies: Negative.  Cutaneous: Negative.  Past Medical History:  Diagnosis Date  . Arthritis   . Asthma   . GERD (gastroesophageal reflux disease)   . Hypertension   . Irritable bowel   . Vitamin D deficiency     History reviewed. No pertinent family history.  Social History   Social History  . Marital status: Single    Spouse name: N/A  . Number of children: N/A  . Years of education: N/A   Occupational History  . Not on file.   Social History Main Topics  . Smoking status: Never Smoker  . Smokeless tobacco: Never Used  . Alcohol use No  . Drug use: No  . Sexual activity: Not on file   Other Topics Concern  . Not on file   Social History Narrative  . No narrative on file    I appreciate the opportunity to take part in Eshal's care. Please do not hesitate to contact me with questions.  Sincerely,   R. Jorene Guest, MD

## 2017-05-06 NOTE — Patient Instructions (Signed)
Asthma with acute exacerbation  A laboratory order form has been provided for CBC with differential and total IgE to assess candidacy for biologic agents.  Prednisone has been provided, 20 mg x 4 days, 10 mg x1 day, then stop.  The prednisone is not to be started until after she has had her labs drawn.  We will switch from dry powder inhaler to [facility].  A prescription has been provided for Advair 230/21 g, 2 inhalations twice a day. To maximize pulmonary deposition, a spacer has been provided along with instructions for its proper administration with an HFA inhaler.  A prescription has been provided for montelukast 10 mg daily at bedtime.  Continue albuterol HFA, 1-2 inhalations every 4-6 hours as needed.  The patient has been asked to contact me if her symptoms persist or progress. Otherwise, she may return for follow up in 4 months.  Acute sinusitis  Prednisone has been provided (as above).    Continue nasal saline irrigation and Nasonex.  Nasal saline lavage (NeilMed) has been recommended prior to medicated nasal sprays and as needed along with instructions for proper administration.  For thick post nasal drainage, add guaifenesin 1200 mg (Mucinex Maximum Strength)  twice daily as needed with adequate hydration as discussed.  Allergic rhinoconjunctivitis  Continue appropriate allergen avoidance measures.  A refill prescription has been provided for Nasonex and nasal saline irrigation (as above).  Continue olopatadine eyedrops as needed.  Restart aeroallergen immunotherapy after this asthma exacerbation has resolved.   Return in about 4 months (around 09/05/2017), or if symptoms worsen or fail to improve.

## 2017-05-06 NOTE — Assessment & Plan Note (Signed)
   Prednisone has been provided (as above).    Continue nasal saline irrigation and Nasonex.  Nasal saline lavage (NeilMed) has been recommended prior to medicated nasal sprays and as needed along with instructions for proper administration.  For thick post nasal drainage, add guaifenesin 1200 mg (Mucinex Maximum Strength)  twice daily as needed with adequate hydration as discussed.

## 2017-05-06 NOTE — Assessment & Plan Note (Addendum)
   A laboratory order form has been provided for CBC with differential and total IgE to assess candidacy for biologic agents.  Prednisone has been provided, 20 mg x 4 days, 10 mg x1 day, then stop.  The prednisone is not to be started until after she has had her labs drawn.  We will switch from dry powder inhaler to [facility].  A prescription has been provided for Advair 230/21 g, 2 inhalations twice a day. To maximize pulmonary deposition, a spacer has been provided along with instructions for its proper administration with an HFA inhaler.  A prescription has been provided for montelukast 10 mg daily at bedtime.  Continue albuterol HFA, 1-2 inhalations every 4-6 hours as needed.  The patient has been asked to contact me if her symptoms persist or progress. Otherwise, she may return for follow up in 4 months.

## 2017-05-13 LAB — CBC WITH DIFFERENTIAL/PLATELET
Basophils Absolute: 0 10*3/uL (ref 0.0–0.2)
Basos: 1 %
EOS (ABSOLUTE): 0.1 10*3/uL (ref 0.0–0.4)
Eos: 2 %
Hematocrit: 40.7 % (ref 34.0–46.6)
Hemoglobin: 13.1 g/dL (ref 11.1–15.9)
Immature Grans (Abs): 0 10*3/uL (ref 0.0–0.1)
Immature Granulocytes: 1 %
Lymphocytes Absolute: 3 10*3/uL (ref 0.7–3.1)
Lymphs: 43 %
MCH: 28.2 pg (ref 26.6–33.0)
MCHC: 32.2 g/dL (ref 31.5–35.7)
MCV: 88 fL (ref 79–97)
Monocytes Absolute: 0.4 10*3/uL (ref 0.1–0.9)
Monocytes: 6 %
Neutrophils Absolute: 3.5 10*3/uL (ref 1.4–7.0)
Neutrophils: 47 %
Platelets: 375 10*3/uL (ref 150–379)
RBC: 4.64 x10E6/uL (ref 3.77–5.28)
RDW: 14 % (ref 12.3–15.4)
WBC: 7.2 10*3/uL (ref 3.4–10.8)

## 2017-05-13 LAB — IGE: IgE (Immunoglobulin E), Serum: 7 IU/mL (ref 0–100)

## 2017-06-18 ENCOUNTER — Other Ambulatory Visit: Payer: Self-pay | Admitting: Internal Medicine

## 2017-06-18 DIAGNOSIS — E049 Nontoxic goiter, unspecified: Secondary | ICD-10-CM

## 2017-06-22 ENCOUNTER — Other Ambulatory Visit: Payer: Self-pay | Admitting: Obstetrics and Gynecology

## 2017-06-30 ENCOUNTER — Ambulatory Visit
Admission: RE | Admit: 2017-06-30 | Discharge: 2017-06-30 | Disposition: A | Discharge status: Home / Self Care | Payer: 59 | Source: Ambulatory Visit | Attending: Internal Medicine | Admitting: Internal Medicine

## 2017-06-30 DIAGNOSIS — E049 Nontoxic goiter, unspecified: Secondary | ICD-10-CM

## 2017-07-09 ENCOUNTER — Other Ambulatory Visit: Payer: Self-pay

## 2017-07-09 NOTE — Telephone Encounter (Signed)
Zetonna nasal spray denied, didn't see where our Dr. prescribed it

## 2017-08-11 ENCOUNTER — Other Ambulatory Visit: Payer: Self-pay

## 2017-08-11 MED ORDER — PANTOPRAZOLE SODIUM 40 MG PO TBEC: 40.0000 mg | DELAYED_RELEASE_TABLET | Freq: Every day | ORAL | 0 refills | Status: DC

## 2017-09-08 ENCOUNTER — Ambulatory Visit (INDEPENDENT_AMBULATORY_CARE_PROVIDER_SITE_OTHER): Payer: 59 | Admitting: Allergy and Immunology

## 2017-09-08 ENCOUNTER — Encounter: Payer: Self-pay | Admitting: Allergy and Immunology

## 2017-09-08 VITALS — BP 128/76 | HR 90 | Temp 98.5°F | Resp 20 | Ht 67.0 in | Wt 290.0 lb

## 2017-09-08 DIAGNOSIS — R0683 Snoring: Secondary | ICD-10-CM | POA: Insufficient documentation

## 2017-09-08 DIAGNOSIS — G4700 Insomnia, unspecified: Secondary | ICD-10-CM | POA: Insufficient documentation

## 2017-09-08 DIAGNOSIS — Z79899 Other long term (current) drug therapy: Secondary | ICD-10-CM | POA: Insufficient documentation

## 2017-09-08 DIAGNOSIS — J4540 Moderate persistent asthma, uncomplicated: Secondary | ICD-10-CM | POA: Diagnosis not present

## 2017-09-08 DIAGNOSIS — R202 Paresthesia of skin: Secondary | ICD-10-CM | POA: Insufficient documentation

## 2017-09-08 DIAGNOSIS — J3089 Other allergic rhinitis: Secondary | ICD-10-CM

## 2017-09-08 DIAGNOSIS — H1010 Acute atopic conjunctivitis, unspecified eye: Secondary | ICD-10-CM | POA: Insufficient documentation

## 2017-09-08 DIAGNOSIS — H1013 Acute atopic conjunctivitis, bilateral: Secondary | ICD-10-CM | POA: Diagnosis not present

## 2017-09-08 MED ORDER — FLUTICASONE-SALMETEROL 230-21 MCG/ACT IN AERO: INHALATION_SPRAY | RESPIRATORY_TRACT | 5 refills | Status: DC

## 2017-09-08 MED ORDER — FLUTICASONE PROPIONATE 93 MCG/ACT NA EXHU: 2.0000 | INHALANT_SUSPENSION | Freq: Two times a day (BID) | NASAL | 5 refills | Status: DC | PRN

## 2017-09-08 NOTE — Patient Instructions (Addendum)
Moderate persistent asthma  I have recommended using Advair 2 30-21 g HFA as follows: 2 inhalations via spacer device twice daily.  Continue albuterol HFA, 1-2 inhalations every 4-6 hours if needed.  Subjective and objective measures of pulmonary function will be followed and the treatment plan will be adjusted accordingly.  Allergic rhinitis Currently with suboptimal control.  Continue appropriate allergen avoidance measures.  A prescription has been [facility] for [facility], 2 actuations per nostril twice a day. Proper technique has been discussed and demonstrated.  Nasal saline lavage (NeilMed) has been recommended as needed and prior to medicated nasal sprays along with instructions for proper administration.  She is motivated to restart aeroallergen immunotherapy injections.  Allergic conjunctivitis  Treatment plan as outlined above for allergic rhinitis.  Continue Pazeo, one drop per eye daily as needed.   Return in about 4 months (around 01/07/2018), or if symptoms worsen or fail to improve.

## 2017-09-08 NOTE — Assessment & Plan Note (Signed)
   Treatment plan as outlined above for allergic rhinitis.  Continue Pazeo, one drop per eye daily as needed. 

## 2017-09-08 NOTE — Assessment & Plan Note (Signed)
   I have recommended using Advair 2 30-21 g HFA as follows: 2 inhalations via spacer device twice daily.  Continue albuterol HFA, 1-2 inhalations every 4-6 hours if needed.  Subjective and objective measures of pulmonary function will be followed and the treatment plan will be adjusted accordingly.

## 2017-09-08 NOTE — Progress Notes (Signed)
Follow-up Note  RE: Tracey Escobar MRN: [id number] DOB: 01/10/1971 Date of Office Visit: 09/08/2017  Primary care provider: Dorothyann PengSanders, Robyn, MD Referring provider: Dorothyann PengSanders, Robyn, MD  History of present illness: Tracey Escobar is a 46 y.o. female with persistent asthma, allergic rhinitis, and gastroesophageal reflux, presenting today for follow up.  She reports that overall she is doing well, however she feels that the Advair 500/50 g Diskus, 1 inhalation twice a day, was "stronger" than the Advair 230/21 g HFA.  However, she admits that she accidentally has only been taking 1 inhalation via spacer device twice a day rather than 2 inhalations via spacer device twice a day as recommended.  In the interval since her previous visit she has only required albuterol rescue on 2 occasions and denies nocturnal awakenings due to lower respiratory symptoms.  She reports that her nasal symptoms are relatively well controlled, however she has been experiencing frequent sinus pressure, typically at least one time per week despite compliance with Nasonex.  The is interested in retarting aeroallergen immunotherapy to reduce symptoms and decrease medication requirement.   Assessment and plan: Moderate persistent asthma  I have recommended using Advair 2 30-21 g HFA as follows: 2 inhalations via spacer device twice daily.  Continue albuterol HFA, 1-2 inhalations every 4-6 hours if needed.  Subjective and objective measures of pulmonary function will be followed and the treatment plan will be adjusted accordingly.  Allergic rhinitis Currently with suboptimal control.  Continue appropriate allergen avoidance measures.  [facility] 2 actuations per nostril twice a day. Proper technique has been discussed and demonstrated.  Nasal saline lavage (NeilMed) has been recommended as needed and prior to medicated nasal sprays along with instructions for proper  administration.  She is motivated to restart aeroallergen immunotherapy injections.  Allergic conjunctivitis  Treatment plan as outlined above for allergic rhinitis.  Continue Pazeo, one drop per eye daily as needed.   Meds ordered this encounter  Medications  . Fluticasone Propionate (XHANCE) 93 MCG/ACT EXHU    Sig: Place 2 sprays into the nose 2 (two) times daily as needed.    Dispense:  16 mL    Refill:  5  . fluticasone-salmeterol (ADVAIR HFA) 230-21 MCG/ACT inhaler    Sig: 2 puffs twice daily use with spacer to prevent coughing or wheezing.    Dispense:  1 Inhaler    Refill:  5    Diagnostics: Prematurity reveals an FVC of 2.39 L and an FEV1 of 1.89 L with an FEV1 ratio of 96%.  FEV1 is improved compared with previous study.  Please see scanned spirometry results for details.    Physical examination: Blood pressure 128/76, pulse 90, temperature 98.5 F (36.9 C), temperature source Oral, resp. rate 20, height 5\' 7"  (1.702 m), weight 290 lb (131.5 kg), SpO2 97 %.  General: Alert, interactive, in no acute distress. HEENT: TMs pearly gray, turbinates moderately edematous without discharge, post-pharynx mildly erythematous. Neck: Supple without lymphadenopathy. Lungs: Clear to auscultation without wheezing, rhonchi or rales. CV: Normal S1, S2 without murmurs. Skin: Warm and dry, without lesions or rashes.  The following portions of the patient's history were reviewed and updated as appropriate: allergies, current medications, past family history, past medical history, past social history, past surgical history and problem list.  Allergies as of 09/08/2017   No Known Allergies     Medication List        Accurate as of 09/08/17  4:56 PM. Always use  your most recent med list.          Alcaftadine 0.25 % Soln Commonly known as:  LASTACAFT USE ONE DROP EACH EYE ONCE DAILY IF NEEDED   azelastine 0.1 % nasal spray Commonly known as:  ASTELIN Place 1 spray into both  nostrils 2 (two) times daily. Use in each nostril as directed   calcium carbonate 1250 (500 Ca) MG tablet Commonly known as:  OS-CAL - dosed in mg of elemental calcium Take 1 tablet by mouth daily with breakfast.   cycloSPORINE 0.05 % ophthalmic emulsion Commonly known as:  RESTASIS Place 1 drop into both eyes 2 (two) times daily.   EPINEPHrine 0.3 mg/0.3 mL Soaj injection Commonly known as:  EPI-PEN Use as directed for anaphylaxis   Fluticasone Propionate 93 MCG/ACT Exhu Commonly known as:  XHANCE Place 2 sprays into the nose 2 (two) times daily as needed.   fluticasone-salmeterol 230-21 MCG/ACT inhaler Commonly known as:  ADVAIR HFA 2 puffs twice daily use with spacer to prevent coughing or wheezing.   HYDROcodone-acetaminophen 5-325 MG tablet Commonly known as:  NORCO/VICODIN Take by mouth.   levocetirizine 5 MG tablet Commonly known as:  XYZAL Take 5 mg by mouth daily.   LINZESS PO Take by mouth 1 day or 1 dose.   losartan 25 MG tablet Commonly known as:  COZAAR Take by mouth daily.   MedroxyPROGESTERone Acetate 150 MG/ML Susy every 3 (three) months.   methylPREDNISolone 4 MG Tbpk tablet Commonly known as:  MEDROL DOSEPAK follow package directions   metoprolol succinate 100 MG 24 hr tablet Commonly known as:  TOPROL-XL Take by mouth.   mometasone 50 MCG/ACT nasal spray Commonly known as:  NASONEX 2 sprays once daily if needed for stuffy nose.   montelukast 10 MG tablet Commonly known as:  SINGULAIR Take 1 tablet by mouth each evening for coughing or wheezing.   Olopatadine HCl 0.7 % Soln Commonly known as:  PAZEO Place 1 drop into both eyes daily.   omeprazole 40 MG capsule Commonly known as:  PRILOSEC Take 40 mg by mouth.   pantoprazole 40 MG tablet Commonly known as:  PROTONIX Take 1 tablet (40 mg total) daily by mouth.   pregabalin 75 MG capsule Commonly known as:  LYRICA Take 75 mg by mouth.   rivaroxaban 20 MG Tabs tablet Commonly  known as:  XARELTO Take by mouth.   spironolactone 25 MG tablet Commonly known as:  ALDACTONE Take 25 mg by mouth daily.   THERA Tabs Take by mouth.   UNABLE TO FIND Med Name: immunotherapy   VENTOLIN HFA 108 (90 Base) MCG/ACT inhaler Generic drug:  albuterol Inhale 2 puffs into the lungs every 6 (six) hours as needed for wheezing or shortness of breath.   Vitamin D3 5000 units Tabs Take 1 tablet by mouth daily.       No Known Allergies  Review of systems: Review of systems negative except as noted in HPI / PMHx or noted below: Constitutional: Negative.  HENT: Negative.   Eyes: Negative.  Respiratory: Negative.   Cardiovascular: Negative.  Gastrointestinal: Negative.  Genitourinary: Negative.  Musculoskeletal: Negative.  Neurological: Negative.  Endo/Heme/Allergies: Negative.  Cutaneous: Negative.  Past Medical History:  Diagnosis Date  . Arthritis   . Asthma   . GERD (gastroesophageal reflux disease)   . Hypertension   . Irritable bowel   . Vitamin D deficiency     Family History  Problem Relation Age of Onset  . Allergic rhinitis Neg  Hx   . Angioedema Neg Hx   . Asthma Neg Hx   . Eczema Neg Hx   . Immunodeficiency Neg Hx   . Urticaria Neg Hx     Social History   Socioeconomic History  . Marital status: Single    Spouse name: Not on file  . Number of children: Not on file  . Years of education: Not on file  . Highest education level: Not on file  Social Needs  . Financial resource strain: Not on file  . Food insecurity - worry: Not on file  . Food insecurity - inability: Not on file  . Transportation needs - medical: Not on file  . Transportation needs - non-medical: Not on file  Occupational History  . Not on file  Tobacco Use  . Smoking status: Never Smoker  . Smokeless tobacco: Never Used  Substance and Sexual Activity  . Alcohol use: Yes    Alcohol/week: 0.6 oz    Types: 1 Glasses of wine per week    Comment: rare use  . Drug  use: No  . Sexual activity: No  Other Topics Concern  . Not on file  Social History Narrative  . Not on file    I appreciate the opportunity to take part in Neidy's care. Please do not hesitate to contact me with questions.  Sincerely,   R. Jorene Guestarter Jaionna Weisse, MD

## 2017-09-08 NOTE — Assessment & Plan Note (Signed)
Currently with suboptimal control.  Continue appropriate allergen avoidance measures.  [facility] 2 actuations per nostril twice a day. Proper technique has been discussed and demonstrated.  Nasal saline lavage (NeilMed) has been recommended as needed and prior to medicated nasal sprays along with instructions for proper administration.  She is motivated to restart aeroallergen immunotherapy injections.

## 2017-09-09 DIAGNOSIS — J301 Allergic rhinitis due to pollen: Secondary | ICD-10-CM | POA: Diagnosis not present

## 2017-09-09 NOTE — Progress Notes (Signed)
VIALS EXP 09-10-18 

## 2017-09-10 ENCOUNTER — Other Ambulatory Visit: Payer: Self-pay | Admitting: *Deleted

## 2017-09-10 DIAGNOSIS — J3089 Other allergic rhinitis: Secondary | ICD-10-CM

## 2017-09-10 MED ORDER — FLUTICASONE PROPIONATE 93 MCG/ACT NA EXHU: 2.0000 | INHALANT_SUSPENSION | Freq: Two times a day (BID) | NASAL | 5 refills | Status: DC | PRN

## 2017-09-16 ENCOUNTER — Other Ambulatory Visit: Payer: Self-pay

## 2017-09-16 MED ORDER — PANTOPRAZOLE SODIUM 40 MG PO TBEC: 40.0000 mg | DELAYED_RELEASE_TABLET | Freq: Every day | ORAL | 5 refills | Status: DC

## 2017-09-29 ENCOUNTER — Ambulatory Visit (INDEPENDENT_AMBULATORY_CARE_PROVIDER_SITE_OTHER): Payer: 59

## 2017-09-29 DIAGNOSIS — J301 Allergic rhinitis due to pollen: Secondary | ICD-10-CM

## 2017-09-29 NOTE — Progress Notes (Signed)
Immunotherapy   Patient Details  Name: [name] MRN: [id number] Date of Birth: 07/09/1971  09/29/2017  [name]  Here to start injections blue vial Following schedule: a  Frequency:once a week Epi-Pen:yes Consent signed and patient instructions given.   Laury AxonCarrie L Whitaker 09/29/2017, 1:45 PM

## 2017-10-15 ENCOUNTER — Ambulatory Visit (INDEPENDENT_AMBULATORY_CARE_PROVIDER_SITE_OTHER): Payer: 59

## 2017-10-15 DIAGNOSIS — J309 Allergic rhinitis, unspecified: Secondary | ICD-10-CM | POA: Diagnosis not present

## 2017-11-08 ENCOUNTER — Telehealth: Payer: Self-pay

## 2017-11-08 NOTE — Telephone Encounter (Signed)
pts called in stating she belives the new advair is not working like the discus. An she would also like a rx for dymista.  Please advise

## 2017-11-08 NOTE — Telephone Encounter (Signed)
Okay, she may go back to the Advair 500-50 g Diskus, 1 inhalation twice daily. Please call in a prescription for Dymista 1-2 sprays twice daily as needed.

## 2017-11-09 ENCOUNTER — Other Ambulatory Visit: Payer: Self-pay

## 2017-11-09 MED ORDER — AZELASTINE-FLUTICASONE 137-50 MCG/ACT NA SUSP: NASAL | 5 refills | Status: DC

## 2017-11-09 MED ORDER — FLUTICASONE-SALMETEROL 500-50 MCG/DOSE IN AEPB: 1.0000 | INHALATION_SPRAY | Freq: Two times a day (BID) | RESPIRATORY_TRACT | 5 refills | Status: DC

## 2017-11-09 NOTE — Telephone Encounter (Signed)
Sent in meds and informed pt. She would like a sample of advair 500 so I have a sample sitting at front desk for her to pick up

## 2017-11-12 ENCOUNTER — Ambulatory Visit (INDEPENDENT_AMBULATORY_CARE_PROVIDER_SITE_OTHER): Payer: 59

## 2017-11-12 DIAGNOSIS — J309 Allergic rhinitis, unspecified: Secondary | ICD-10-CM

## 2017-12-27 IMAGING — US US THYROID
1 series · 14 of 25 positions shown · non-contrast
Comparison: None.

CLINICAL DATA: 45-year-old female with a history of thyroid goiter

EXAM:
THYROID ULTRASOUND
TECHNIQUE: Ultrasound examination of the thyroid gland and adjacent soft
tissues was performed.

[Series 1: us thyroid · 0.08mm/px · 14 of 68 slices shown]
[im 1/68]
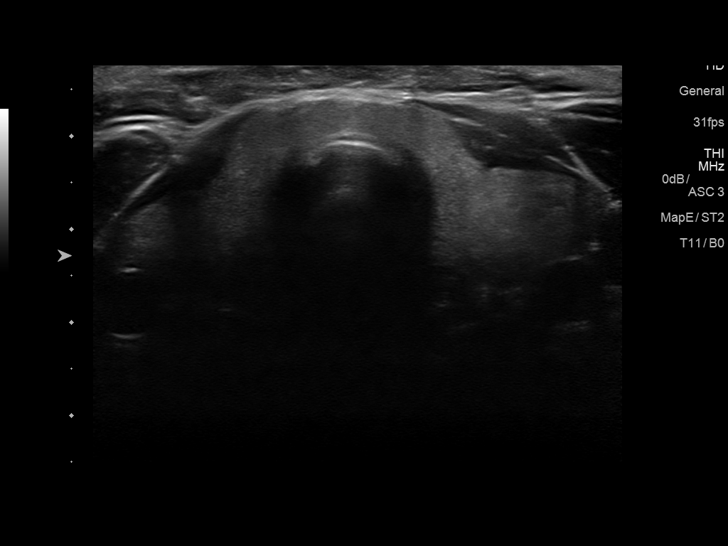
[im 6/68]
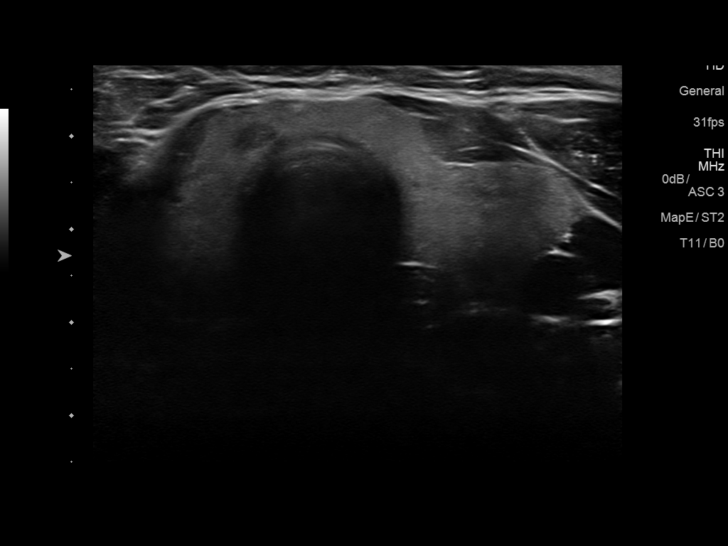
[im 12/68]
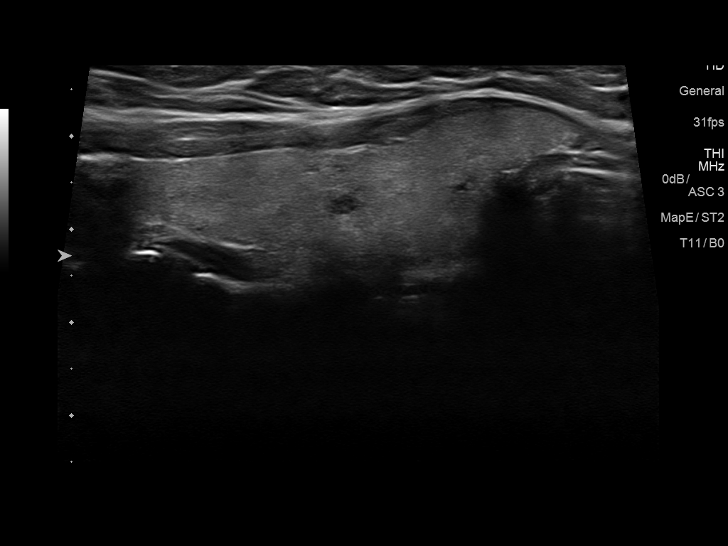
[im 17/68]
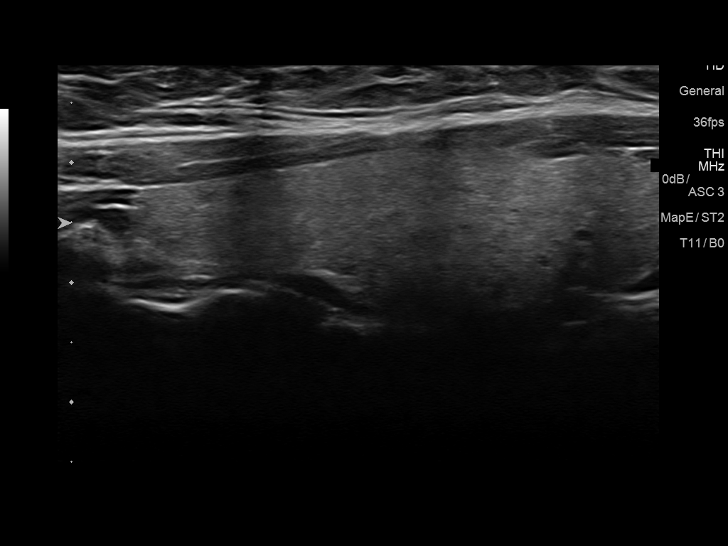
[im 23/68]
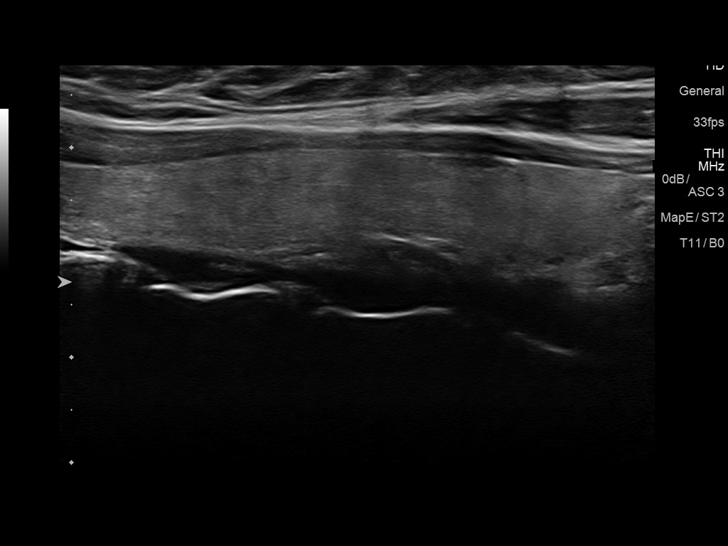
[im 26/68]
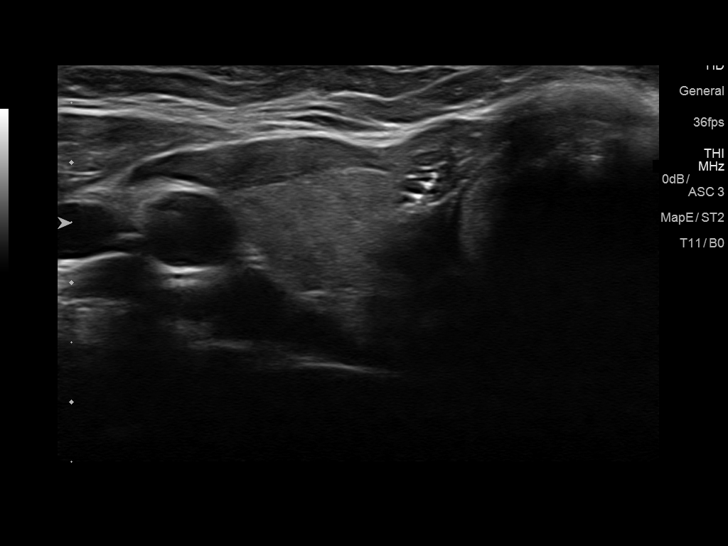
[im 31/68]
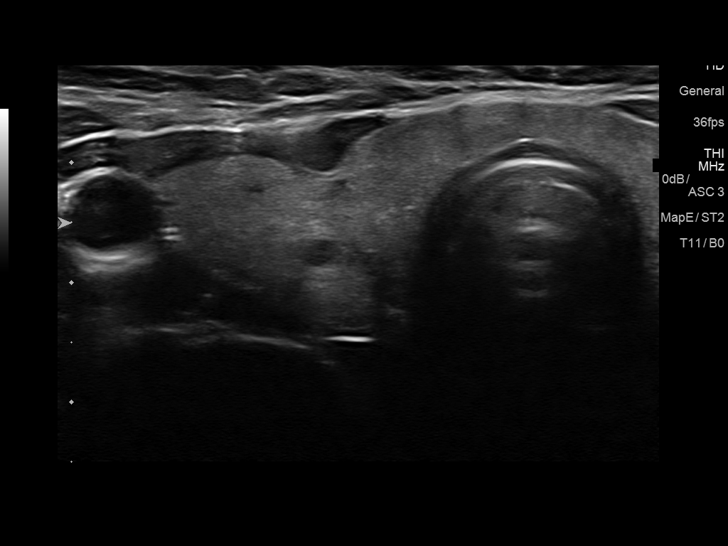
[im 37/68]
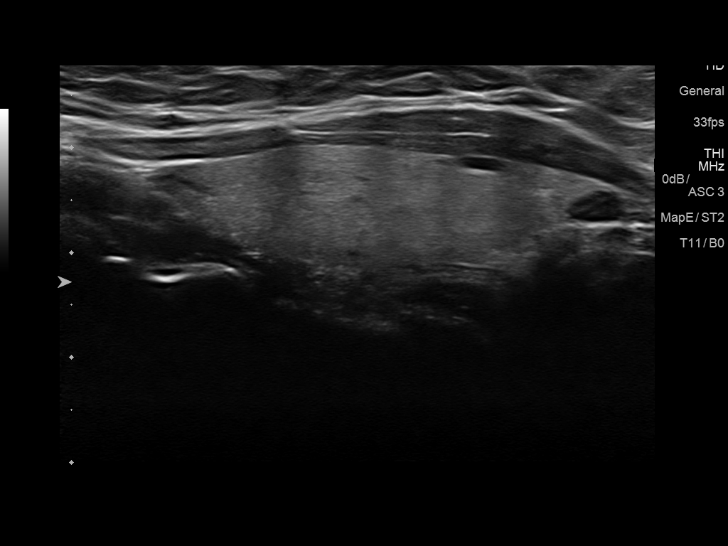
[im 42/68]
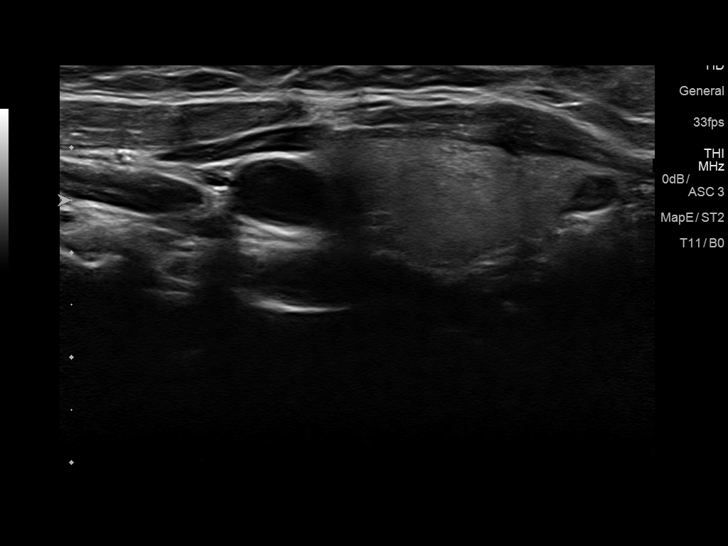
[im 45/68]
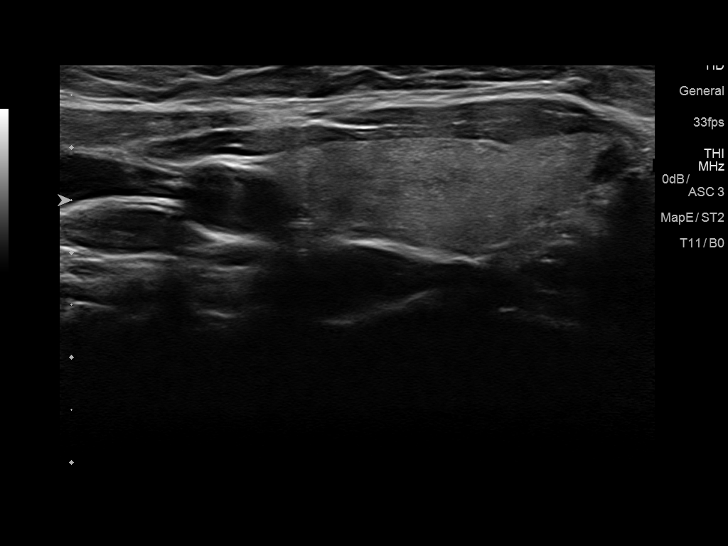
[im 51/68]
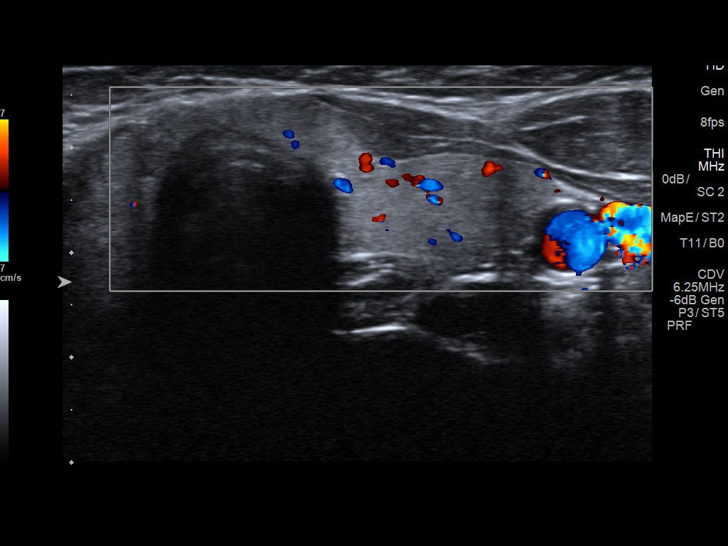
[im 56/68]
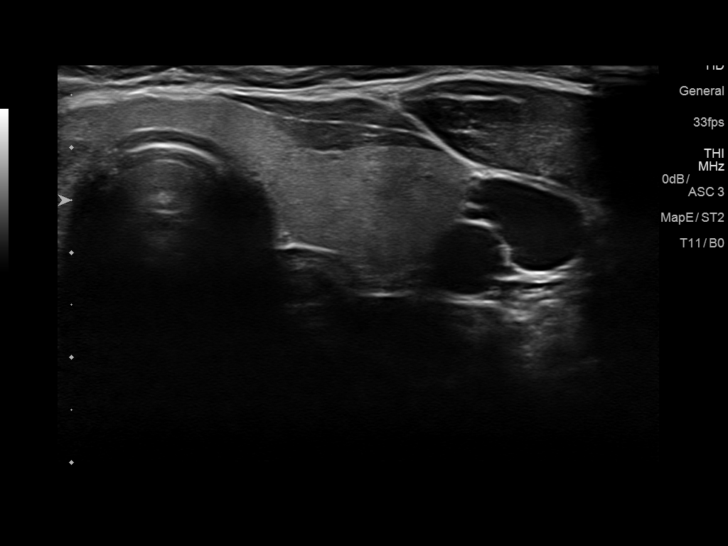
[im 62/68]
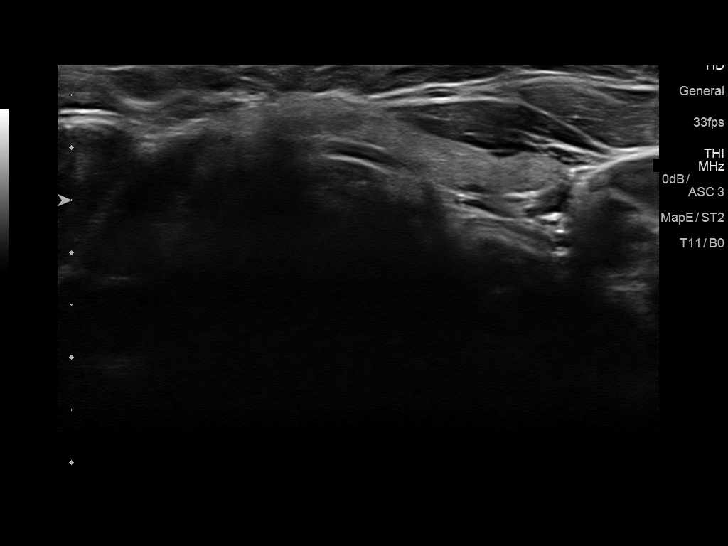
[im 68/68]
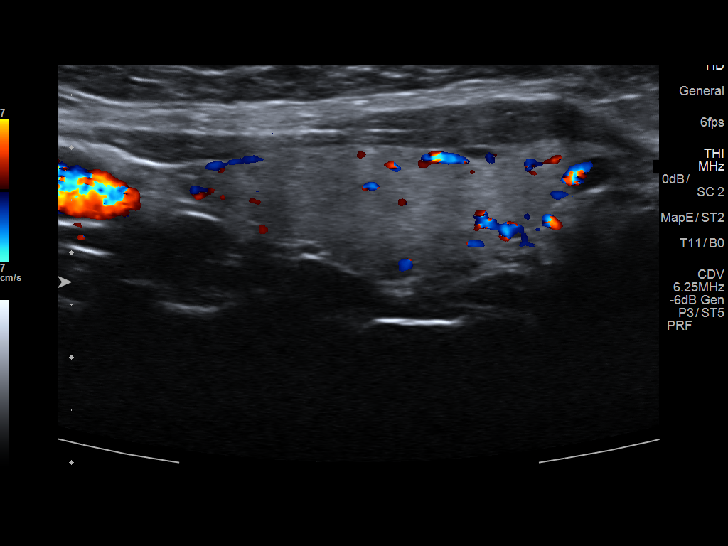

[14 of 25 positions shown; findings below may reference images not displayed]

FINDINGS: Parenchymal Echotexture: Mildly heterogenous

Isthmus: 0.4 cm

Right lobe: 6.1 cm x 1.4 cm x 2.1 cm

Left lobe: 4.3 cm x 1.4 cm x 1.9 cm

_________________________________________________________

Estimated total number of nodules >/= 1 cm: 0

Number of spongiform nodules >/=  2 cm not described below (TR1): 0

Number of mixed cystic and solid nodules >/= 1.5 cm not described
below (TR2): 0

_________________________________________________________

No discrete nodules are seen within the thyroid gland.

No adenopathy.
IMPRESSION: Mildly heterogeneous echotexture of the thyroid may indicate medical
thyroid disease.

## 2018-01-11 ENCOUNTER — Ambulatory Visit (INDEPENDENT_AMBULATORY_CARE_PROVIDER_SITE_OTHER): Payer: 59 | Admitting: *Deleted

## 2018-01-11 DIAGNOSIS — J309 Allergic rhinitis, unspecified: Secondary | ICD-10-CM | POA: Diagnosis not present

## 2018-01-12 ENCOUNTER — Ambulatory Visit: Payer: 59 | Admitting: Allergy and Immunology

## 2018-01-24 ENCOUNTER — Ambulatory Visit (INDEPENDENT_AMBULATORY_CARE_PROVIDER_SITE_OTHER): Payer: 59 | Admitting: *Deleted

## 2018-01-24 ENCOUNTER — Other Ambulatory Visit: Payer: Self-pay | Admitting: Allergy

## 2018-01-24 DIAGNOSIS — J309 Allergic rhinitis, unspecified: Secondary | ICD-10-CM | POA: Diagnosis not present

## 2018-01-28 ENCOUNTER — Ambulatory Visit (INDEPENDENT_AMBULATORY_CARE_PROVIDER_SITE_OTHER): Payer: 59 | Admitting: *Deleted

## 2018-01-28 DIAGNOSIS — J309 Allergic rhinitis, unspecified: Secondary | ICD-10-CM

## 2018-02-04 ENCOUNTER — Ambulatory Visit (INDEPENDENT_AMBULATORY_CARE_PROVIDER_SITE_OTHER): Payer: 59

## 2018-02-04 ENCOUNTER — Telehealth: Payer: Self-pay

## 2018-02-04 DIAGNOSIS — J309 Allergic rhinitis, unspecified: Secondary | ICD-10-CM

## 2018-02-04 MED ORDER — AZELASTINE-FLUTICASONE 137-50 MCG/ACT NA SUSP: NASAL | 1 refills | Status: DC

## 2018-02-04 NOTE — Telephone Encounter (Signed)
Pt called states Dymista was never called in for her. Dymista sent to pharmacy

## 2018-02-11 ENCOUNTER — Telehealth: Payer: Self-pay

## 2018-02-11 MED ORDER — FLUTICASONE PROPIONATE 50 MCG/ACT NA SUSP: NASAL | 5 refills | Status: DC

## 2018-02-11 MED ORDER — AZELASTINE HCL 0.1 % NA SOLN: NASAL | 5 refills | Status: DC

## 2018-02-11 NOTE — Telephone Encounter (Signed)
Spoke to pt. To let her know we were sending in azelastine and fluticasone to make up for the dymista. Sent into pt.'s pharmacy.

## 2018-02-18 ENCOUNTER — Ambulatory Visit (INDEPENDENT_AMBULATORY_CARE_PROVIDER_SITE_OTHER): Payer: 59

## 2018-02-18 DIAGNOSIS — J309 Allergic rhinitis, unspecified: Secondary | ICD-10-CM

## 2018-03-03 ENCOUNTER — Ambulatory Visit (INDEPENDENT_AMBULATORY_CARE_PROVIDER_SITE_OTHER): Payer: 59 | Admitting: *Deleted

## 2018-03-03 DIAGNOSIS — J309 Allergic rhinitis, unspecified: Secondary | ICD-10-CM

## 2018-03-10 ENCOUNTER — Ambulatory Visit: Payer: 59 | Admitting: Allergy and Immunology

## 2018-03-11 ENCOUNTER — Ambulatory Visit (INDEPENDENT_AMBULATORY_CARE_PROVIDER_SITE_OTHER): Payer: 59

## 2018-03-11 DIAGNOSIS — J309 Allergic rhinitis, unspecified: Secondary | ICD-10-CM

## 2018-04-14 ENCOUNTER — Other Ambulatory Visit: Payer: Self-pay

## 2018-04-14 DIAGNOSIS — J309 Allergic rhinitis, unspecified: Secondary | ICD-10-CM

## 2018-04-14 DIAGNOSIS — J45901 Unspecified asthma with (acute) exacerbation: Secondary | ICD-10-CM

## 2018-04-14 DIAGNOSIS — H1010 Acute atopic conjunctivitis, unspecified eye: Secondary | ICD-10-CM

## 2018-04-14 MED ORDER — MONTELUKAST SODIUM 10 MG PO TABS: ORAL_TABLET | ORAL | 0 refills | Status: DC

## 2018-04-14 NOTE — Telephone Encounter (Signed)
[facility]

## 2018-06-17 DIAGNOSIS — Z6841 Body Mass Index (BMI) 40.0 and over, adult: Secondary | ICD-10-CM

## 2018-06-17 DIAGNOSIS — I119 Hypertensive heart disease without heart failure: Secondary | ICD-10-CM

## 2018-06-17 DIAGNOSIS — Z79899 Other long term (current) drug therapy: Secondary | ICD-10-CM

## 2018-06-17 DIAGNOSIS — R0683 Snoring: Secondary | ICD-10-CM | POA: Diagnosis not present

## 2018-06-17 DIAGNOSIS — I4891 Unspecified atrial fibrillation: Secondary | ICD-10-CM

## 2018-06-17 DIAGNOSIS — G4700 Insomnia, unspecified: Secondary | ICD-10-CM

## 2018-06-17 LAB — BASIC METABOLIC PANEL
BUN: 10 (ref 4–21)
CREATININE: 0.8 (ref <=1.1)
Glucose: 78
Potassium: 2.9 — AB (ref 3.4–5.3)
SODIUM: 138 (ref 137–147)

## 2018-06-18 ENCOUNTER — Encounter: Payer: Self-pay | Admitting: Internal Medicine

## 2018-06-18 DIAGNOSIS — I119 Hypertensive heart disease without heart failure: Secondary | ICD-10-CM

## 2018-06-20 ENCOUNTER — Other Ambulatory Visit: Payer: Self-pay | Admitting: Allergy

## 2018-06-20 MED ORDER — FLUTICASONE-SALMETEROL 500-50 MCG/DOSE IN AEPB: 1.0000 | INHALATION_SPRAY | Freq: Two times a day (BID) | RESPIRATORY_TRACT | 3 refills | Status: DC

## 2018-06-23 ENCOUNTER — Other Ambulatory Visit: Payer: Self-pay

## 2018-06-23 NOTE — Telephone Encounter (Signed)
[facility] Pt needs an OV

## 2018-06-24 ENCOUNTER — Other Ambulatory Visit: Payer: Self-pay | Admitting: *Deleted

## 2018-06-28 ENCOUNTER — Other Ambulatory Visit: Payer: Self-pay | Admitting: Allergy

## 2018-07-05 ENCOUNTER — Ambulatory Visit: Payer: 59

## 2018-07-06 ENCOUNTER — Ambulatory Visit (INDEPENDENT_AMBULATORY_CARE_PROVIDER_SITE_OTHER): Payer: 59 | Admitting: Internal Medicine

## 2018-07-06 ENCOUNTER — Other Ambulatory Visit: Payer: 59

## 2018-07-06 VITALS — BP 128/70 | Ht 66.0 in | Wt 279.0 lb

## 2018-07-06 DIAGNOSIS — R634 Abnormal weight loss: Secondary | ICD-10-CM

## 2018-07-06 DIAGNOSIS — Z79899 Other long term (current) drug therapy: Secondary | ICD-10-CM

## 2018-07-07 ENCOUNTER — Encounter: Payer: Self-pay | Admitting: Internal Medicine

## 2018-07-07 LAB — POTASSIUM: Potassium: 3.8 mmol/L (ref 3.5–5.2)

## 2018-07-07 MED ORDER — NON FORMULARY: 1.0000 mL | Status: DC

## 2018-07-07 NOTE — Progress Notes (Signed)
Pt came in for Lipo injection

## 2018-07-10 NOTE — Progress Notes (Signed)
Your potassium level is normal.  How do you feel after Lipo-B shot?

## 2018-07-11 ENCOUNTER — Telehealth: Payer: Self-pay

## 2018-07-11 NOTE — Telephone Encounter (Signed)
-----   Message from Dorothyann Peng, MD sent at 07/10/2018  9:26 PM EDT ----- Your potassium level is normal.  How do you feel after Lipo-B shot?

## 2018-07-11 NOTE — Telephone Encounter (Signed)
Left a message for the pt to call me back so that I can give her her lab results.

## 2018-07-13 ENCOUNTER — Other Ambulatory Visit: Payer: Self-pay | Admitting: Internal Medicine

## 2018-07-13 MED ORDER — PHENTERMINE HCL 37.5 MG PO CAPS: 37.5000 mg | ORAL_CAPSULE | ORAL | 1 refills | Status: DC

## 2018-07-18 ENCOUNTER — Other Ambulatory Visit: Payer: Self-pay | Admitting: Allergy

## 2018-07-18 ENCOUNTER — Telehealth: Payer: Self-pay | Admitting: Allergy

## 2018-07-18 MED ORDER — PANTOPRAZOLE SODIUM 40 MG PO TBEC: 40.0000 mg | DELAYED_RELEASE_TABLET | Freq: Every day | ORAL | 5 refills | Status: DC

## 2018-07-18 NOTE — Telephone Encounter (Signed)
Dr. Dellis Anes, Insurance will not cover Q-Var. What would you like for [place] to fax in? Thank you

## 2018-07-19 NOTE — Telephone Encounter (Signed)
I do not see Qvar anywhere on her visit notes. Her latest notes mentioned Advair. Please advise.  Malachi Bonds, MD [facility]

## 2018-07-19 NOTE — Telephone Encounter (Signed)
Dr. Dellis Anes it shouldhavebeen Joshua Zeringue DOB [phone]. I am sorry.

## 2018-07-22 ENCOUNTER — Ambulatory Visit: Payer: 59

## 2018-08-05 ENCOUNTER — Ambulatory Visit: Payer: 59

## 2018-08-09 ENCOUNTER — Encounter: Payer: Self-pay | Admitting: Neurology

## 2018-08-09 ENCOUNTER — Ambulatory Visit: Payer: 59 | Admitting: Neurology

## 2018-08-09 VITALS — BP 128/79 | HR 86 | Ht 67.0 in | Wt 295.0 lb

## 2018-08-09 DIAGNOSIS — R351 Nocturia: Secondary | ICD-10-CM | POA: Insufficient documentation

## 2018-08-09 DIAGNOSIS — G47411 Narcolepsy with cataplexy: Secondary | ICD-10-CM | POA: Diagnosis not present

## 2018-08-09 DIAGNOSIS — R252 Cramp and spasm: Secondary | ICD-10-CM

## 2018-08-09 DIAGNOSIS — J4541 Moderate persistent asthma with (acute) exacerbation: Secondary | ICD-10-CM

## 2018-08-09 DIAGNOSIS — G4719 Other hypersomnia: Secondary | ICD-10-CM | POA: Diagnosis not present

## 2018-08-09 DIAGNOSIS — R0683 Snoring: Secondary | ICD-10-CM

## 2018-08-09 DIAGNOSIS — D509 Iron deficiency anemia, unspecified: Secondary | ICD-10-CM | POA: Diagnosis not present

## 2018-08-09 DIAGNOSIS — I480 Paroxysmal atrial fibrillation: Secondary | ICD-10-CM | POA: Insufficient documentation

## 2018-08-09 DIAGNOSIS — F518 Other sleep disorders not due to a substance or known physiological condition: Secondary | ICD-10-CM | POA: Insufficient documentation

## 2018-08-09 DIAGNOSIS — Z6841 Body Mass Index (BMI) 40.0 and over, adult: Secondary | ICD-10-CM

## 2018-08-09 NOTE — Patient Instructions (Signed)
Please remember to try to maintain good sleep hygiene, which means: Keep a regular sleep and wake schedule, try not to exercise or have a meal within 2 hours of your bedtime, try to keep your bedroom conducive for sleep, that is, cool and dark, without light distractors such as an illuminated alarm clock, and refrain from watching TV right before sleep or in the middle of the night and do not keep the TV or radio on during the night. Also, try not to use or play on electronic devices at bedtime, such as your cell phone, tablet PC or laptop. If you like to read at bedtime on an electronic device, try to dim the background light as much as possible. Do not eat in the middle of the night.   We will request an attended sleep study.    We will look for leg twitching and snoring or sleep apnea.   For chronic insomnia, you are best followed by a psychiatrist and/or sleep psychologist.    The problem of recurrent insomnia is discussed. Avoidance of caffeine sources is strongly encouraged. Sleep hygiene issues are reviewed.  MSLT - no antidepressants, stimulant medications and no caffeine on board ?  Narcolepsy Narcolepsy is a neurological disorder that causes you to fall asleep suddenly, and without control, during the daytime (sleep attacks). Narcolepsy is a lifelong (chronic) disorder. Normally, sleep follows a regular cycle over the course of the night. After about 90 minutes of light sleep, your sleep should become deeper. When your sleep becomes deeper, your body moves less and you start dreaming. This type of deep sleep is called rapid eye movement (REM) sleep. When you have narcolepsy, your REM sleep is not well-regulated. This disrupts your sleep cycle, which causes daytime sleepiness. What are the causes? The cause of narcolepsy is not fully understood, but it may be related to:  Low levels of hypocretin, a chemical (neurotransmitter) in the brain that controls sleep and wake cycles. Hypocretin  imbalance may be caused by: ? Abnormal genes that are passed from parent to child (inherited). ? The body's defense system (immune system) attacking hypocretin brain cells (autoimmune disease).  Infection, tumor, or injury in the area of the brain that controls sleep.  Exposure to poisons (toxins), such as heavy metals, pesticides, and secondhand smoke.  What are the signs or symptoms? Symptoms of this condition include:  Excessive daytime sleepiness. This is the most common symptom and is usually the first symptom you will notice. This may affect your performance at work or school.  Sleep attacks. This means falling asleep suddenly and without control. You may fall asleep in the middle of an activity, especially low-energy activities like reading or watching TV.  Feeling like you cannot think clearly.  Trouble focusing or remembering things.  Feeling depressed.  Sudden muscle weakness (cataplexy). When this occurs, your speech may become slurred, or your knees may buckle. Cataplexy is usually triggered by surprise, anger, fear, or laughter.  Loss of the ability to speak or move (sleep paralysis). This may occur just as you start to fall asleep or wake up. You will be aware of the paralysis. It usually lasts for just a few seconds or minutes.  Seeing, hearing, tasting, smelling, or feeling things that are not real (hallucinations). Hallucinations may occur with sleep paralysis. They can happen when you are falling asleep, waking up, or dozing.  Trouble staying asleep at night (insomnia).  Restless sleep.  How is this diagnosed? This condition may be diagnosed based  on:  A physical exam to rule out any other problems that may be causing your symptoms.You may be asked to write down your sleeping patterns for several weeks in a sleep diary. This will help your health care provider make a diagnosis.  Sleep studies that measure how well your REM sleep is regulated. These tests also  measure your heart rate, breathing, movement, and brain waves. These tests include: ? An overnight sleep study (polysomnogram). ? A daytime sleep study that is done while you take several naps during the day (multiple sleep latency test, MSLT). This test measures how quickly you fall asleep and how quickly you enter REM sleep.  Removal of spinal fluid to measure hypocretin levels.  How is this treated? There is no cure for this condition, but treatment can help relieve symptoms. Treatment may include:  Lifestyle and sleeping strategies to help you cope with the condition, such as: ? Exercising regularly. ? Maintaining a regular sleep schedule. ? Avoiding caffeine and large meals before bed.  Medicines. These may include: ? Medicines that help keep you awake and alert (stimulants) to fight daytime sleepiness. ? Medicines that treat depression (antidepressants). These may be used to treat cataplexy. ? Sodium oxybate. This is a strong medicine to help you relax (sedative) that you may take at night. It can help control daytime sleepiness and cataplexy.  Follow these instructions at home: Sleeping habits  Get about 8 hours of sleep every night.  Go to sleep and get up at about the same time every day.  Keep your bedroom dark, quiet, and comfortable.  When you feel very tired, take short naps. Schedule naps so that you take them at about the same time every day.  Tell your employer or teachers that you have narcolepsy. You may be able to adjust your schedule to include time for naps.  Before bedtime: ? Avoid bright lights and screens. ? Relax. Try activities like reading or taking a warm bath. Activity  Get at least 20 minutes of exercise every day. This will help you sleep better at night and reduce daytime sleepiness.  Avoid exercising within 3 hours of bedtime.  If you are sleepy, do not drive or use heavy machinery.  If possible, take a nap before driving.  Do not swim or  go out on the water without a life jacket. Eating and drinking  Do not drink alcohol or caffeinated beverages within 4-5 hours of bedtime.  Do not eat a lot of food before bedtime. Eat meals at about the same times every day. General instructions  Take over-the-counter and prescription medicines only as told by your health care provider.  If directed, keep a sleep diary.  Tell your employer or teachers that you have narcolepsy. You may be able to adjust your schedule to include time for naps.  Do not use any products that contain nicotine or tobacco, such as cigarettes and e-cigarettes. If you need help quitting, ask your health care provider.  Keep all follow-up visits as told by your health care provider. This is important. Contact a health care provider if:  Your symptoms are not getting better.  You have increasingly high blood pressure (hypertension).  You have changes in your heart rhythm.  You are having a hard time determining what is real and what is not (psychosis). Get help right away if:  You hurt yourself during a sleep attack or an attack of cataplexy.  You have chest pain.  You have trouble breathing. This  information is not intended to replace advice given to you by your health care provider. Make sure you discuss any questions you have with your health care provider. Document Released: 09/11/2002 Document Revised: 09/14/2016 Document Reviewed: 09/14/2016 Elsevier Interactive Patient Education  [employer].

## 2018-08-09 NOTE — Progress Notes (Signed)
SLEEP MEDICINE CLINIC   Provider:  Larey Seat, M.D.   Referring Provider: Glendale Chard, MD Primary Care Physician:  Glendale Chard, MD  Chief Complaint  Patient presents with  . Follow-up    pt alone, rm 10. pt was here in 2017 completed a HST which was normal. her PCP sent her back here because the patient has difficulty sleeping at night. she is having fatigue during the day because of the lack of sleep. she has dozed at work. PCP was hoping she could repeat sleep study and do a over night study. also has cardiology concerns. she has had a overnight study completed before 5-10 years ago which did confirm apnea and suggested treatment with CPAP but patient never started.    Chief complaint according to patient : "I cannot get enough sleep" and  " I am just very tired all the time".    HPI:  Tracey Escobar is a 47 y.o. female patient , seen here as a RV on 08-09-2018 from Dr. Baird Cancer for a new sleep consult.  She had ben seen in 2017 for a sleep consult, but in the interval there have been medical developments.  [name] Session been evaluated before and endorsed the Epworth sleepiness score at only 10 but the fatigue severity score at 47 points felt achy, numb had tingling in the leg and feet spasms sometimes in the left lower extremities.  She was tested for apnea which was ruled out and an apnea link home sleep test from 12 August 2016.  Her AHI was 3.9 her RDI was 8/h, oxygen desaturation index was 5.9/h and she did not have prolonged hypoxemia time.  Average pulse rate was 88 bpm which is rather high for sleep. Meanwhile she had 2 hospitalizations in March 2018 and in March 2019 for atrial fibrillation-  She continues to feel sleepy at work, has fallen asleep at her desk,  yawning all day, and having no energy to exercise.  She goes to bed by 9 PM and is asleep by 10 PM, wakes up between 2 and 3 AM every morning, rises since she is not able to go back to sleep.    Notes from  07-13-2016 our first consult  Mrs. Jump is a 47 year old female patient referred for a sleep evaluation based on the feeling of chronic and intense fatigue. She feels tired all the time. She has been treated in the past for gastric reflux disease, asthma, vitamin D deficiency, and hypertension. She has had weight gain, but has no thyroid disease and no diabetes.  She feels tired and "never " got 8 hours of sleep. Some nights she will not sleep, just doze.   I reviewed her medication an most recent labs.   Sleep habits are as follows 08-09-2018 : [name] is still used to sleep with the TV in the background- she feels hypervigilant without sound-scape, has not put in on a timer.  . She has however experience that his sleep sounder if the TV is not running. She feels TV helps her to fall asleep not to stay asleep. The bedroom is otherwise cool, quiet. She prefers to sleep on her side, with 2 pillows supporting her head. She always wakes up between 2 and 3 AM , wakes gasping, choking and sometimes from a dream- sometimes from urge to urinate. Often unable to return to sleep or just being in a light daze. Her alarm clock is set for 6:15 AM  She wakes up frequently,  her sleep is fragmented,  and she watches TV again when waking up - and she has multiple bathroom breaks.  Sometimes she wakes up with headaches , leaves the bed and does cleaning, laundry etc.  She reports vividly dreaming and dreaming almost every night, sometimes she feels that she cannot possibly be asleep because her dreams are life like. She has woken herself by snoring and has been told that she snores, she has not been told that she stops breathing at night.  She experienced sleep choking when falling asleep in a seated position.  She does now nap, which she had denies in 2017- she always craves to nap !   Sleep medical history " I have had sleep problems all my life " . New diagnosis of atrial fib, Obesity is increased- over the  last 1 year, BMI is now    Social history: Lives alone, She may drink alcohol 1-4 times per year, she has never used tobacco products, caffeine use in form of iced tea and soda up to 4 sometimes more than 4 a day. She works from 8:30 AM to 5 PM, has no history of shift work.  Review of Systems: Out of a complete 14 system review, the patient complains of only the following symptoms, and all other reviewed systems are negative.  Sleep paralysis, sleep attacks, yawning, cataplectic isolated spells.   Epworth score 10 , Fatigue severity score 47   , depression score 4/15, achyness, numbness, tingling in legs and feet, and has still spasms worse on the left, charleyhorse.    Social History   Socioeconomic History  . Marital status: Single    Spouse name: Not on file  . Number of children: Not on file  . Years of education: Not on file  . Highest education level: Not on file  Occupational History  . Not on file  Social Needs  . Financial resource strain: Not on file  . Food insecurity:    Worry: Not on file    Inability: Not on file  . Transportation needs:    Medical: Not on file    Non-medical: Not on file  Tobacco Use  . Smoking status: Never Smoker  . Smokeless tobacco: Never Used  Substance and Sexual Activity  . Alcohol use: Yes    Alcohol/week: 1.0 standard drinks    Types: 1 Glasses of wine per week    Comment: rare use  . Drug use: No  . Sexual activity: Not on file  Lifestyle  . Physical activity: NONE    Days per week: Not on file    Minutes per session: Not on file  . Stress: Not on file  Relationships  . Social connections:    Talks on phone:     Gets together:     Attends religious service: Goes to [employer], friends at [employer] member of club or organization:     Attends meetings of clubs or organizations:     Relationship status: Single   .                       Other Topics Concern  . Not on file  Social History Narrative  . Not on file      Family History  Problem Relation Age of Onset  . Hyperlipidemia Mother   . Hypertension Mother   . Hyperlipidemia Father   . Hypertension Father   . COPD Father   . Heart  failure Father   . Diabetes Brother   . Hypertension Maternal Grandmother   . Seizures Maternal Grandmother   . Cancer Paternal Grandfather   . Cancer Brother   . Allergic rhinitis Neg Hx   . Angioedema Neg Hx   . Asthma Neg Hx   . Eczema Neg Hx   . Immunodeficiency Neg Hx   . Urticaria Neg Hx     Past Medical History:  Diagnosis Date  . Arthritis   . Asthma   . Atrial fibrillation ([place])   . GERD (gastroesophageal reflux disease)   . Hypertension   . Irritable bowel   . Vitamin D deficiency     Past Surgical History:  Procedure Laterality Date  . EXCISION MASS UPPER EXTREMETIES Right 02/25/2016   Procedure: EXCISION RIGHT UPPER ARM MASS;  Surgeon: Coralie Keens, MD;  Location: [facility];  Service: General;  Laterality: Right;    Current Outpatient Medications  Medication Sig Dispense Refill  . albuterol (VENTOLIN HFA) 108 (90 BASE) MCG/ACT inhaler Inhale 2 puffs into the lungs every 6 (six) hours as needed for wheezing or shortness of breath.    . Alcaftadine (LASTACAFT) 0.25 % SOLN USE ONE DROP EACH EYE ONCE DAILY IF NEEDED 1 Bottle 3  . azelastine (ASTELIN) 0.1 % nasal spray Place 1 spray into both nostrils 2 (two) times daily. Use in each nostril as directed 30 mL 5  . calcium carbonate (OS-CAL - DOSED IN MG OF ELEMENTAL CALCIUM) 1250 (500 Ca) MG tablet Take 1 tablet by mouth daily with breakfast.    . Cholecalciferol (VITAMIN D3) 5000 UNITS TABS Take 1 tablet by mouth daily.    . cycloSPORINE (RESTASIS) 0.05 % ophthalmic emulsion Place 1 drop into both eyes 2 (two) times daily.    Marland Kitchen EPINEPHrine 0.3 mg/0.3 mL IJ SOAJ injection Use as directed for anaphylaxis 2 Device 1  . fluticasone (FLONASE) 50 MCG/ACT nasal spray 2 sprays per nostril once daily for stuffy nose. 16 g 5  .  Fluticasone-Salmeterol (ADVAIR DISKUS) 500-50 MCG/DOSE AEPB Inhale 1 puff into the lungs 2 (two) times daily. Rinse gargle and spit after use. 1 each 3  . fluticasone-salmeterol (ADVAIR HFA) 230-21 MCG/ACT inhaler 2 puffs twice daily use with spacer to prevent coughing or wheezing. 1 Inhaler 5  . hydrochlorothiazide (HYDRODIURIL) 12.5 MG tablet Take 12.5 mg by mouth daily.    Marland Kitchen levocetirizine (XYZAL) 5 MG tablet Take 5 mg by mouth daily.    . Linaclotide (LINZESS PO) Take by mouth 1 day or 1 dose.    . losartan (COZAAR) 25 MG tablet losartan 25 mg tablet    . Magnesium 250 MG TABS Take by mouth.    . MedroxyPROGESTERone Acetate 150 MG/ML SUSY every 3 (three) months.    . metoprolol succinate (TOPROL-XL) 100 MG 24 hr tablet metoprolol succinate ER 100 mg tablet,extended release 24 hr    . montelukast (SINGULAIR) 10 MG tablet Take 1 tablet by mouth each evening for coughing or wheezing. 30 tablet 0  . Multiple Vitamin (THERA) TABS Take by mouth.    . Olopatadine HCl (PAZEO) 0.7 % SOLN Place 1 drop into both eyes daily. 1 Bottle 5  . omeprazole (PRILOSEC) 40 MG capsule Take 40 mg by mouth.    . phentermine 37.5 MG capsule Take 1 capsule (37.5 mg total) by mouth every morning. 30 capsule 1  . rivaroxaban (XARELTO) 20 MG TABS tablet Take by mouth.    Marland Kitchen UNABLE TO FIND Med Name: immunotherapy  No current facility-administered medications for this visit.     Allergies as of 08/09/2018  . (No Known Allergies)    Vitals: BP 128/79   Pulse 86   Ht _0  (1.702 m)   Wt 295 lb (133.8 kg)   BMI 46.20 kg/m  Last Weight:  Wt Readings from Last 1 Encounters:  08/09/18 295 lb (133.8 kg)   JAS:NKNL mass index is 46.2 kg/m.     Last Height:   Ht Readings from Last 1 Encounters:  08/09/18 _1  (1.702 m)    Physical exam:  BMI is now 46.2- morbidly obese.   General: The patient is awake, alert and appears not in acute distress. The patient is well groomed. Head: Normocephalic, atraumatic.  Neck is supple. Mallampati 5 neck circumference: 14.5 . Nasal airflow patent ,  Sinusitis, TMJ click evident . Retrognathia is seen.  Cardiovascular:  Regular rate and rhythm, without murmurs or carotid bruit, and without distended neck veins. Respiratory: Lungs wheezing to auscultation, recent asthma exacerbation, SOB.   Skin:  Without evidence of edema, or rash .Neurologic exam : The patient is yawning non-stop, oriented to place and time.   Attention span & concentration ability appears limited, too tired to concentrate "  Speech is fluent,  without dysarthria, dysphonia or aphasia.  Mood and affect are appropriate.  Cranial nerves: Pupils are equal and briskly reactive to light. Funduscopic exam without  evidence of pallor or edema. Extraocular movements  in vertical and horizontal planes intact and without nystagmus. Visual fields by finger perimetry are intact. Hearing to finger rub intact.   Facial sensation intact to fine touch.  Facial motor strength is symmetric and tongue and uvula move midline. Shoulder shrug was symmetrical.   Motor exam:   Normal tone, muscle bulk and symmetric strength in all extremities.  Sensory:  Fine touch, pinprick and vibration -Proprioception tested in the upper extremities was normal.  Coordination: Rapid alternating movements ,Finger-to-nose maneuverwithout evidence of ataxia, dysmetria or tremor.  Gait and station: Patient walks without assistive device. Stance is stable and normal.  Deep tendon reflexes: in the  upper and lower extremities are symmetric and intact. Babinski maneuver response is downgoing.  The patient was advised of the nature of the diagnosed sleep disorder , the treatment options and risks for general a health and wellness arising from not treating the condition.  The patient's insurance company has only permitted a home sleep test 2 years ago, and since then her cardiologist in [place] has tried twice to approve her for a  attended in lab study.  I am convinced that a repeat home sleep test will not solve her problems, and that the need to have an attended sleep study, I need to see the cause of her arousals, and the degree of hypoxemia related to sleep stages which at home sleep test can provide.  Since she has a new diagnosis of atrial fibrillation this is even more urgent.  She has developed ankle edema, she now takes naps that she did not take 2 years ago, and she has fallen asleep at work Print[employer].  Fatigue severity is now endorsed at 60 out of 63 points and her Epworth sleepiness score has risen to 15 from previously 10 out of 24 possible points. I spent more than 66mnutes of face to face time with the patient. Greater than 50% of time was spent in counseling and coordination of care. We have discussed the diagnosis and differential and I  answered the patient's questions.     Assessment:  After physical and neurologic examination, review of laboratory studies,  Personal review of imaging studies, reports of other /same  Imaging studies ,  Results of polysomnography/ neurophysiology testing and pre-existing records as far as provided in visit., my assessment is :  1) fragmented sleep - Mrs. Kallen does not have witnesses to her sleep pattern but has snored herself awake. She is also aware that his sleep is fragmented, she wakes up frequently not always sure why and the external stimulation by the TV may play a role in this. Her sleep hygiene remains poor- 2 years after we dicussed the needs or electronics to not be in the bedroom.  Gave her the 14 days sleep hygiene boot camp.   2) atrial fibrillation can be caused by apnea- other symptoms of her's are Nocturia, yawning, snoring, gasping and choking - all can be related to atrial fibrillation and fluid retention.   2 b) HTN has increased, and medication has controlled rate and hypertension. Has lightheadedness and dizziness - orthostatic.    3) EDS at 15/ 24 points on Epworth score. Reports vivid dreams, irresistible urge to go to sleep.  Has experienced some cataplectic forme fruste manifestation. 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 She is so tired that she forgets more, is inattentive.   3) Obesity and previously documented desaturation with mild OSA- may have increased significantly - consider medical weight management, establish the patient's metabolic rate.   Plan:  Treatment plan and additional workup :    PSG with MSLT to follow.   HLA narcolepsy alleles.  Weight management .   Sleep hygiene.     Asencion Partridge Cerissa Zeiger MD  08/09/2018   CC: Glendale Chard, [place], [place] [address]

## 2018-08-09 NOTE — Addendum Note (Signed)
Addended by: Melvyn Novas on: 08/09/2018 09:21 AM   Modules accepted: Orders

## 2018-08-10 ENCOUNTER — Telehealth: Payer: Self-pay | Admitting: Neurology

## 2018-08-10 NOTE — Telephone Encounter (Signed)
Called the patient and informed her the lab work for iron and ferritin levels were normal and Dr Vickey Huger didn't feel this was a cause for restlessness in extremities. Informed her we were still waiting for the narcolepsy gene evaluation to come back. Informed her that if it was abnormal then I would give her a call but if normal then she wouldn't hear from me. Pt verbalized understanding and was appreciative for the results.

## 2018-08-10 NOTE — Telephone Encounter (Signed)
-----   Message from Melvyn Novas, MD sent at 08/10/2018  4:40 PM EST ----- Normal ferritin level, normal TIBC- not a case of iron deficiency contributing to RLS.

## 2018-08-17 ENCOUNTER — Ambulatory Visit: Payer: 59 | Admitting: Internal Medicine

## 2018-08-17 LAB — NARCOLEPSY EVALUATION
DQA1*01:02: NEGATIVE
DQB1*06:02: NEGATIVE

## 2018-08-17 LAB — ANEMIA PANEL
FOLATE, HEMOLYSATE: 391.7 ng/mL
Ferritin: 74 ng/mL (ref 15–150)
Folate, RBC: 1004 ng/mL (ref >498)
Hematocrit: 39 % (ref 34.0–46.6)
IRON: 54 ug/dL (ref 27–159)
Iron Saturation: 20 % (ref 15–55)
Retic Ct Pct: 1.3 % (ref 0.6–2.6)
Total Iron Binding Capacity: 272 ug/dL (ref 250–450)
UIBC: 218 ug/dL (ref 131–425)
VITAMIN B 12: 769 pg/mL (ref 232–1245)

## 2018-08-18 ENCOUNTER — Encounter: Payer: Self-pay | Admitting: Neurology

## 2018-08-19 ENCOUNTER — Telehealth: Payer: Self-pay | Admitting: *Deleted

## 2018-08-19 ENCOUNTER — Other Ambulatory Visit: Payer: Self-pay | Admitting: Internal Medicine

## 2018-08-19 NOTE — Telephone Encounter (Signed)
-----  Message from Larey Seat, MD sent at 08/18/2018  8:36 AM EST ----- Negative HLA narcolepsy alleles.

## 2018-08-19 NOTE — Telephone Encounter (Signed)
Called pt LVM (ok per DPR) informing her of all lab results, negative HLA narcolepsy alleles and Normal ferritin level, normal TIBC- not a case of iron deficiency contributing to RLS. Advised pt to call back with any questions. Left office number and hours in message.

## 2018-08-19 NOTE — Telephone Encounter (Signed)
Notes recorded by Dohmeier, Porfirio Mylararmen, MD on 08/10/2018 at 4:40 PM EST Normal ferritin level, normal TIBC- not a case of iron deficiency contributing to RLS.

## 2018-08-23 ENCOUNTER — Other Ambulatory Visit: Payer: Self-pay | Admitting: Obstetrics and Gynecology

## 2018-09-20 ENCOUNTER — Other Ambulatory Visit: Payer: Self-pay | Admitting: Obstetrics and Gynecology

## 2018-09-23 ENCOUNTER — Other Ambulatory Visit: Payer: Self-pay | Admitting: Internal Medicine

## 2018-09-26 NOTE — Pre-Procedure Instructions (Signed)
Tracey Escobar  09/26/2018      [id number] Tracey Escobar, [name]    Your procedure is scheduled on October 06, 2018.  [facility] at [facility]30 AM.  Call this number if you have problems the morning of surgery:  [phone]   Remember:  Do not eat after midnight.  You may drink clear liquids until 0430 AM.  Clear liquids allowed are:                    Water, Juice (non-citric and without pulp), Clear Tea, Black Coffee only and Gatorade    Take these medicines the morning of surgery with A SIP OF WATER  Albuterol inhaler-if needed-bring with you Metoprolol succinate (Toprol XL) Montelukast (singulair) Pantoprazole (protonix) Cyclosporine (restasis)-if needed Advair diskus Linzess  Follow your surgeon's instructions on when to hold/resume Xarelto.  If no instructions were given call the office to determine how they would like to you take Xarelto  7 days prior to surgery STOP taking any Aspirin (unless otherwise instructed by your surgeon), Aleve, Naproxen, Ibuprofen, Motrin, Advil, Goody's, BC's, all herbal medications, fish oil, and all vitamins    Do not wear jewelry, make-up or nail polish.  Do not wear lotions, powders, or perfumes, or deodorant.  Do not shave 48 hours prior to surgery.   Do not bring valuables to the hospital.  [facility] is not responsible for any belongings or valuables.  Contacts, dentures or bridgework may not be worn into surgery.  Leave your suitcase in the car.  After surgery it may be brought to your room.  For patients admitted to the hospital, discharge time will be determined by your treatment team.  Patients discharged the day of surgery will not be  allowed to drive home.    [facility]- Preparing For Surgery  Before surgery, you can play an important role. Because skin is not sterile, your skin needs to be as free of germs as possible. You can reduce the number of germs on your skin by washing with CHG (chlorahexidine gluconate) Soap before surgery.  CHG is an antiseptic cleaner which kills germs and bonds with the skin to continue killing germs even after washing.    Oral Hygiene is also important to reduce your risk of infection.  Remember - BRUSH YOUR TEETH THE MORNING OF SURGERY WITH YOUR REGULAR TOOTHPASTE  Please do not use if you have an allergy to CHG or antibacterial soaps. If your skin becomes reddened/irritated stop using the CHG.  Do not shave (including legs and underarms) for at least 48 hours prior to first CHG shower. It is OK to shave your face.  Please follow these instructions carefully.   1. Shower the NIGHT BEFORE SURGERY and the MORNING OF SURGERY with CHG.   2. If you chose to wash your hair, wash your hair first as usual with your normal shampoo.  3. After you shampoo, rinse your hair and body thoroughly to remove the shampoo.  4. Use CHG as you would any other liquid soap. You can apply CHG directly to the skin and wash gently with a scrungie or a clean washcloth.   5. Apply the CHG Soap to your body ONLY  FROM THE NECK DOWN.  Do not use on open wounds or open sores. Avoid contact with your eyes, ears, mouth and genitals (private parts). Wash Face and genitals (private parts)  with your normal soap.  6. Wash thoroughly, paying special attention to the area where your surgery will be performed.  7. Thoroughly rinse your body with warm water from the neck down.  8. DO NOT shower/wash with your normal soap after using and rinsing off the CHG Soap.  9. Pat yourself dry with a CLEAN TOWEL.  10. Wear CLEAN PAJAMAS to bed the night before surgery, wear comfortable clothes the morning of surgery  11. Place  CLEAN SHEETS on your bed the night of your first shower and DO NOT SLEEP WITH PETS.  Day of Surgery:  Do not apply any deodorants/lotions.  Please wear clean clothes to the hospital/surgery center.   Remember to brush your teeth WITH YOUR REGULAR TOOTHPASTE.  Please read over the following fact sheets that you were given.

## 2018-09-27 ENCOUNTER — Encounter (HOSPITAL_COMMUNITY)
Admission: RE | Admit: 2018-09-27 | Discharge: 2018-09-27 | Disposition: A | Discharge status: Home / Self Care | Payer: 59 | Source: Ambulatory Visit | Attending: Obstetrics and Gynecology | Admitting: Obstetrics and Gynecology

## 2018-09-27 ENCOUNTER — Other Ambulatory Visit: Payer: Self-pay

## 2018-09-27 ENCOUNTER — Encounter (HOSPITAL_COMMUNITY): Payer: Self-pay

## 2018-09-27 DIAGNOSIS — Z01818 Encounter for other preprocedural examination: Secondary | ICD-10-CM | POA: Insufficient documentation

## 2018-09-27 DIAGNOSIS — N939 Abnormal uterine and vaginal bleeding, unspecified: Secondary | ICD-10-CM | POA: Diagnosis not present

## 2018-09-27 HISTORY — DX: Sleep apnea, unspecified: G47.30

## 2018-09-27 HISTORY — DX: Pneumonia, unspecified organism: J18.9

## 2018-09-27 HISTORY — DX: Cardiac arrhythmia, unspecified: I49.9

## 2018-09-27 LAB — CBC
HCT: 42.7 % (ref 36.0–46.0)
Hemoglobin: 13.2 g/dL (ref 12.0–15.0)
MCH: 27 pg (ref 26.0–34.0)
MCHC: 30.9 g/dL (ref 30.0–36.0)
MCV: 87.5 fL (ref 80.0–100.0)
NRBC: 0 % (ref 0.0–0.2)
Platelets: 357 10*3/uL (ref 150–400)
RBC: 4.88 MIL/uL (ref 3.87–5.11)
RDW: 14.6 % (ref 11.5–15.5)
WBC: 5.9 10*3/uL (ref 4.0–10.5)

## 2018-09-27 NOTE — Progress Notes (Signed)
PCP - Dorothyann Pengobyn Sanders MD Cardiologist - Dr. Wynonia HazardKhawaja  Chest x-ray - N/A EKG - 12/28/17 ECHO - 2019 STRESS - 2019  Sleep Study - having one this weekend 10/01/18  Blood Thinner Instructions: Was told Dr. Debria GarretStringer's office would call and inform her Aspirin Instructions: N/A  Anesthesia review: requested documents  Patient denies shortness of breath, fever, cough and chest pain at PAT appointment   Patient verbalized understanding of instructions that were given to them at the PAT appointment. Patient was also instructed that they will need to review over the PAT instructions again at home before surgery.

## 2018-09-29 ENCOUNTER — Other Ambulatory Visit: Payer: Self-pay | Admitting: Internal Medicine

## 2018-09-29 NOTE — Progress Notes (Signed)
Anesthesia Chart Review:  Case:  [id number] Date/Time:  10/06/18 0945   Procedure:  DILATATION & CURETTAGE/HYSTEROSCOPY WITH NOVASURE ABLATION (N/A )   Anesthesia type:  Choice   Pre-op diagnosis:  Abnormal Uterine Bleeding, Uterine Fibroids   Location:  [facility] ROOM 4 / [facility]   Surgeon:  Kirkland HunStringer, Arthur, MD      DISCUSSION: 47 yo never smoker. Pertinent hx includes GERD, Asthma, OSA, HTN, Afib.  Pt has history of paroxysmal afib. In 2018 she had an episode of afib with RVR, converted to sinus rhythm on IV amiodarone and was started on chronic anticoagulation with Xarelto.  More recently she was admitted to [name] for chest pain. Per discharge summary 12/23/2017 in Care Everywhere :  "Chest pain- multiple risk factors for ACS- Admitted for observation to monitored bed. Nonischemic EKG, patient in NSR. Cycled cardiac enzymes to r/o possible ACS, Troponin x 3 normal, CK/CKMB normal. Provided pain management PRN. Continued nitroglycerin and PRN oxygen. Continue Aspirin and statin therapy. 2D echo preliminary results with normal LSVF with EF > 55%, will follow up final results outpatient. Patient underwent stress test this AM and negative per cardiologist. Follow up outpatient as scheduled."  Pt has not yet heard from Dr. Debria GarretStringer's office regarding stopping Xarelto. She was instructed to call the office to follow up.  Pt currently denies any CV symptoms. Given recent negative cardiac workup including nonischemic stress test and Echo with EF >55%, anticipate she can proceed as planned barring acute status change.  VS: BP (!) 115/50 Comment: taken manually/notified Midwife[facility]ginai RN  Pulse 79   Temp 37.2 C   Resp 20   Ht 5\' 7"  (1.702 m)   Wt 130.5 kg   LMP 09/26/2018   SpO2 100%   BMI 45.08 kg/m   PROVIDERS: Dorothyann PengSanders, Robyn, MD is PCP  Gwen HerKhawaja, Usman, MD is Cardiologist  LABS: Labs reviewed: Acceptable for surgery. (all labs ordered are listed, but only abnormal results are  displayed)  Labs Reviewed  CBC     IMAGES: Chest PA and lateral 12/23/17 (Care everywhere):  INDICATION:Chest Pain  COMPARISON:Chest radiograph December 15, 2016  TECHNIQUE: PA and lateral chest radiograph.  FINDINGS: Lungs: No pneumothorax, pleural effusion or focal consolidation. Heart: Normal heart size. Mediastinal and hilar structures: Normal contours.  Bony structures: No acute osseous abnormality. Mild multilevel degenerative changes.  EKG: 12/28/2017 (care everywhere, narrative only): Normal sinus rhythm. Rate 82.  CV: Nuclear Stress 12/26/2017 (care everywhere): NUCLEAR INTERPRETATION: For the resting examination patient received total of 12.1 mCi radioactive technetium labeled sestamibi followed by SPECT imaging. For the stress examination patient underwent Lexiscan as per Portocol and received total of 36 mCi radioactive technetium  labeled sestamibi followed by gated SPECT imaging.  There is no significant abnormality of the left ventricular wall motion or wall thickness noted with post-stress resting left ventricular ejection fraction of 60%. There are no myocardial perfusion defects seen during stress or resting images. There is mild increased gut uptake noted.  CONCLUSIONS: This study is negative for myocardial ischemia with normal LVEF.  EKG INTERPRETATION:  EKG negative for ischemia. No arrhythmias noted. Patient remained hemodynamically stable.  TTE 12/24/2017 (care everywhere): Interpretation Summary A complete two-dimensional transthoracic echocardiogram was performed (2D, M-mode, Doppler and color flow Doppler). The study was technically adequate. Contrast injection was performed. There is mild concentric left ventricular hypertrophy. Ejection Fraction = >55%. The atrial septum is aneurysmal. A small patent foramen ovale is present. Injection of contrast documented an interatrial shunt.  Past Medical History:  Diagnosis Date  . Arthritis   .  Asthma   . Atrial fibrillation (HCC)   . Dysrhythmia    A-Fib  . GERD (gastroesophageal reflux disease)   . Hypertension   . Irritable bowel   . Pneumonia   . Sleep apnea   . Vitamin D deficiency     Past Surgical History:  Procedure Laterality Date  . COLONOSCOPY    . EXCISION MASS UPPER EXTREMETIES Right 02/25/2016   Procedure: EXCISION RIGHT UPPER ARM MASS;  Surgeon: Abigail Miyamotoouglas [name], MD;  Location: [facility] OR;  Service: General;  Laterality: Right;    MEDICATIONS: . albuterol (VENTOLIN HFA) 108 (90 BASE) MCG/ACT inhaler  . calcium carbonate (OS-CAL - DOSED IN MG OF ELEMENTAL CALCIUM) 1250 (500 Ca) MG tablet  . Cholecalciferol (VITAMIN D3) 5000 UNITS TABS  . cycloSPORINE (RESTASIS) 0.05 % ophthalmic emulsion  . EPINEPHrine 0.3 mg/0.3 mL IJ SOAJ injection  . fluticasone (FLONASE) 50 MCG/ACT nasal spray  . Fluticasone-Salmeterol (ADVAIR DISKUS) 500-50 MCG/DOSE AEPB  . hydrochlorothiazide (HYDRODIURIL) 12.5 MG tablet  . levocetirizine (XYZAL) 5 MG tablet  . linaclotide (LINZESS) 145 MCG CAPS capsule  . losartan (COZAAR) 25 MG tablet  . Magnesium 250 MG TABS  . MedroxyPROGESTERone Acetate 150 MG/ML SUSY  . metoprolol succinate (TOPROL-XL) 100 MG 24 hr tablet  . montelukast (SINGULAIR) 10 MG tablet  . Olopatadine HCl (PAZEO) 0.7 % SOLN  . pantoprazole (PROTONIX) 40 MG tablet  . rivaroxaban (XARELTO) 20 MG TABS tablet   No current facility-administered medications for this encounter.    Zannie CoveJames Burns, PA-C [facility] Phone [phone] 09/30/2018 2:28 PM

## 2018-09-30 NOTE — Anesthesia Preprocedure Evaluation (Addendum)
Anesthesia Evaluation  Patient identified by MRN, date of birth, ID band Patient awake    Reviewed: Allergy & Precautions, NPO status , Patient's Chart, lab work & pertinent test results  History of Anesthesia Complications Negative for: history of anesthetic complications  Airway Mallampati: II  TM Distance: >3 FB Neck ROM: Full    Dental  (+) Dental Advisory Given, Teeth Intact   Pulmonary asthma , sleep apnea (new diagnosis) ,    breath sounds clear to auscultation       Cardiovascular hypertension, Pt. on medications + dysrhythmias (on xarelto) Atrial Fibrillation  Rhythm:Regular Rate:Normal   '19 Nuc Stress (Care Everywhere) - There is no significant abnormality of the left ventricular wall motion or wall thickness noted with post-stress resting left ventricular ejection fraction of 60%. There are no myocardial perfusion defects seen during stress or resting images. There is mild increased gut uptake noted. CONCLUSIONS: This study is negative for myocardial ischemia with normal LVEF.  '19 TTE (Care Everywhere) - There is mild concentric left ventricular hypertrophy. Ejection Fraction = >55%. The atrial septum is aneurysmal. A small patent foramen ovale is present. Injection of contrast documented an interatrial shunt.    Neuro/Psych negative neurological ROS  negative psych ROS   GI/Hepatic Neg liver ROS, GERD  Medicated and Controlled, IBS    Endo/Other  Morbid obesity  Renal/GU negative Renal ROS     Musculoskeletal  (+) Arthritis ,   Abdominal (+) + obese,   Peds  Hematology negative hematology ROS (+)   Anesthesia Other Findings   Reproductive/Obstetrics                           Anesthesia Physical Anesthesia Plan  ASA: III  Anesthesia Plan: General   Post-op Pain Management:    Induction: Intravenous  PONV Risk Score and Plan: 4 or greater and Treatment may vary due to  age or medical condition, Ondansetron, Dexamethasone and Midazolam  Airway Management Planned: LMA  Additional Equipment: None  Intra-op Plan:   Post-operative Plan: Extubation in OR  Informed Consent: I have reviewed the patients History and Physical, chart, labs and discussed the procedure including the risks, benefits and alternatives for the proposed anesthesia with the patient or authorized representative who has indicated his/her understanding and acceptance.   Dental advisory given  Plan Discussed with: CRNA and Anesthesiologist  Anesthesia Plan Comments:       Anesthesia Quick Evaluation

## 2018-10-02 ENCOUNTER — Ambulatory Visit (INDEPENDENT_AMBULATORY_CARE_PROVIDER_SITE_OTHER): Payer: 59 | Admitting: Neurology

## 2018-10-02 DIAGNOSIS — I480 Paroxysmal atrial fibrillation: Secondary | ICD-10-CM

## 2018-10-02 DIAGNOSIS — G47411 Narcolepsy with cataplexy: Secondary | ICD-10-CM

## 2018-10-02 DIAGNOSIS — R351 Nocturia: Secondary | ICD-10-CM

## 2018-10-02 DIAGNOSIS — Z6841 Body Mass Index (BMI) 40.0 and over, adult: Secondary | ICD-10-CM

## 2018-10-02 DIAGNOSIS — G4719 Other hypersomnia: Secondary | ICD-10-CM

## 2018-10-02 DIAGNOSIS — G4733 Obstructive sleep apnea (adult) (pediatric): Secondary | ICD-10-CM | POA: Diagnosis not present

## 2018-10-02 DIAGNOSIS — F518 Other sleep disorders not due to a substance or known physiological condition: Secondary | ICD-10-CM

## 2018-10-02 DIAGNOSIS — J4541 Moderate persistent asthma with (acute) exacerbation: Secondary | ICD-10-CM

## 2018-10-03 ENCOUNTER — Ambulatory Visit: Payer: 59

## 2018-10-03 NOTE — H&P (Signed)
Admission History and Physical Exam for a Gynecology Patient  Tracey Escobar is a 47 y.o. female who presents for hysteroscopy, dilation and curettage, and NovaSure ablation of the endometrium. [facility].  She complains of menorrhagia.  An endometrial biopsy was benign.  Her Pap smear was normal in July 2019.  Gonorrhea and Chlamydia cultures are negative.  She has a history of atrial fibrillation and has been placed on Xarelto.  OB History   No obstetric history on file.     Past Medical History:  Diagnosis Date  . Arthritis   . Asthma   . Atrial fibrillation (HCC)   . Dysrhythmia    A-Fib  . GERD (gastroesophageal reflux disease)   . Hypertension   . Irritable bowel   . Pneumonia   . Sleep apnea   . Vitamin D deficiency     Current medications:  Xarelto, hydrochlorothiazide, Linzess, Depo-Provera  Past Surgical History:  Procedure Laterality Date  . COLONOSCOPY    . EXCISION MASS UPPER EXTREMETIES Right 02/25/2016   Procedure: EXCISION RIGHT UPPER ARM MASS;  Surgeon: Abigail Miyamotoouglas [name], MD;  Location: [facility] OR;  Service: General;  Laterality: Right;    No Known Allergies  Family History: family history includes COPD in her father; Cancer in her brother and paternal grandfather; Diabetes in her brother; Heart failure in her father; Hyperlipidemia in her father and mother; Hypertension in her father, maternal grandmother, and mother; Seizures in her maternal grandmother.  Social History:  reports that she has never smoked. She has never used smokeless tobacco. She reports current alcohol use of about 1.0 standard drinks of alcohol per week. She reports that she does not use drugs.  Review of systems: See HPI.  Admission Physical Exam:    BMI equals 46  Last menstrual period 09/26/2018.  HEENT:                 Within normal limits Chest:                   Clear Heart:                    Regular rate and  rhythm Breasts:                No masses, skin changes, bleeding, or discharge present Abdomen:             Nontender, no masses Extremities:          Grossly normal Neurologic exam: Grossly normal  Pelvic exam:  External genitalia: normal general appearance Vaginal: normal without tenderness, induration or masses Cervix: normal appearance Adnexa: normal bimanual exam Uterus: Upper limits normal size   CBC    Component Value Date/Time   WBC 5.9 09/27/2018 1059   RBC 4.88 09/27/2018 1059   HGB 13.2 09/27/2018 1059   HGB 13.1 05/11/2017 1623   HCT 42.7 09/27/2018 1059   HCT 39.0 08/09/2018 0927   PLT 357 09/27/2018 1059   PLT 375 05/11/2017 1623   MCV 87.5 09/27/2018 1059   MCV 88 05/11/2017 1623   MCH 27.0 09/27/2018 1059   MCHC 30.9 09/27/2018 1059   RDW 14.6 09/27/2018 1059   RDW 14.0 05/11/2017 1623   LYMPHSABS 3.0 05/11/2017 1623   EOSABS 0.1 05/11/2017 1623   BASOSABS 0.0 05/11/2017 1623     Assessment:  Menorrhagia Atrial fibrillation Anticoagulation with Xarelto BMI equals 46 Hypertension  Plan:  The patient will undergo hysteroscopy, dilation and curettage, and NovaSure ablation of the endometrium.  She understands the indications for surgical procedure.  She also understands the alternative treatment options.  She accepts the risk of, but not limited to, anesthetic complications, bleeding, infections, and possible damage to the surrounding organs.   Tracey Escobar 10/03/2018

## 2018-10-06 ENCOUNTER — Ambulatory Visit (HOSPITAL_COMMUNITY): Payer: 59 | Admitting: Vascular Surgery

## 2018-10-06 ENCOUNTER — Other Ambulatory Visit: Payer: Self-pay

## 2018-10-06 ENCOUNTER — Ambulatory Visit (HOSPITAL_COMMUNITY): Payer: 59 | Admitting: Anesthesiology

## 2018-10-06 ENCOUNTER — Encounter (HOSPITAL_COMMUNITY)
Admission: RE | Disposition: A | Discharge status: Home / Self Care | Payer: Self-pay | Source: Home / Self Care | Attending: Obstetrics and Gynecology

## 2018-10-06 ENCOUNTER — Ambulatory Visit (HOSPITAL_COMMUNITY)
Admission: RE | Admit: 2018-10-06 | Discharge: 2018-10-06 | Disposition: A | Discharge status: Home / Self Care | Payer: 59 | Attending: Obstetrics and Gynecology | Admitting: Obstetrics and Gynecology

## 2018-10-06 ENCOUNTER — Encounter (HOSPITAL_COMMUNITY): Payer: Self-pay

## 2018-10-06 DIAGNOSIS — Z6841 Body Mass Index (BMI) 40.0 and over, adult: Secondary | ICD-10-CM | POA: Diagnosis not present

## 2018-10-06 DIAGNOSIS — I4891 Unspecified atrial fibrillation: Secondary | ICD-10-CM | POA: Insufficient documentation

## 2018-10-06 DIAGNOSIS — I10 Essential (primary) hypertension: Secondary | ICD-10-CM | POA: Diagnosis not present

## 2018-10-06 DIAGNOSIS — Z7901 Long term (current) use of anticoagulants: Secondary | ICD-10-CM | POA: Insufficient documentation

## 2018-10-06 DIAGNOSIS — N920 Excessive and frequent menstruation with regular cycle: Secondary | ICD-10-CM | POA: Insufficient documentation

## 2018-10-06 DIAGNOSIS — K219 Gastro-esophageal reflux disease without esophagitis: Secondary | ICD-10-CM | POA: Diagnosis not present

## 2018-10-06 DIAGNOSIS — Z793 Long term (current) use of hormonal contraceptives: Secondary | ICD-10-CM | POA: Insufficient documentation

## 2018-10-06 DIAGNOSIS — G4730 Sleep apnea, unspecified: Secondary | ICD-10-CM | POA: Insufficient documentation

## 2018-10-06 DIAGNOSIS — Z79899 Other long term (current) drug therapy: Secondary | ICD-10-CM | POA: Insufficient documentation

## 2018-10-06 HISTORY — PX: DILITATION & CURRETTAGE/HYSTROSCOPY WITH NOVASURE ABLATION: SHX5568

## 2018-10-06 LAB — PREGNANCY, URINE: PREG TEST UR: NEGATIVE

## 2018-10-06 SURGERY — DILATATION & CURETTAGE/HYSTEROSCOPY WITH NOVASURE ABLATION
Anesthesia: General | Site: Vagina

## 2018-10-06 MED ORDER — LACTATED RINGERS IV SOLN
INTRAVENOUS | Status: DC
  Administered 2018-10-06: 125 mL/h via INTRAVENOUS
  Administered 2018-10-06: 11:00:00 via INTRAVENOUS

## 2018-10-06 MED ORDER — OXYCODONE-ACETAMINOPHEN 10-325 MG PO TABS: 1.0000 | ORAL_TABLET | ORAL | 0 refills | Status: DC | PRN

## 2018-10-06 MED ORDER — DEXAMETHASONE SODIUM PHOSPHATE 10 MG/ML IJ SOLN
INTRAMUSCULAR | Status: AC
  Filled 2018-10-06: qty 1

## 2018-10-06 MED ORDER — FENTANYL CITRATE (PF) 100 MCG/2ML IJ SOLN
25.0000 ug | INTRAMUSCULAR | Status: DC | PRN
  Administered 2018-10-06: 50 ug via INTRAVENOUS

## 2018-10-06 MED ORDER — PHENYLEPHRINE 40 MCG/ML (10ML) SYRINGE FOR IV PUSH (FOR BLOOD PRESSURE SUPPORT)
PREFILLED_SYRINGE | INTRAVENOUS | Status: AC
  Filled 2018-10-06: qty 10

## 2018-10-06 MED ORDER — FENTANYL CITRATE (PF) 100 MCG/2ML IJ SOLN
INTRAMUSCULAR | Status: AC
  Filled 2018-10-06: qty 2

## 2018-10-06 MED ORDER — ACETAMINOPHEN 10 MG/ML IV SOLN
1000.0000 mg | Freq: Once | INTRAVENOUS | Status: AC
  Administered 2018-10-06: 1000 mg via INTRAVENOUS

## 2018-10-06 MED ORDER — ACETAMINOPHEN 500 MG PO TABS: 500.0000 mg | ORAL_TABLET | Freq: Four times a day (QID) | ORAL | 0 refills | Status: DC | PRN

## 2018-10-06 MED ORDER — LIDOCAINE HCL (CARDIAC) PF 100 MG/5ML IV SOSY
PREFILLED_SYRINGE | INTRAVENOUS | Status: DC | PRN
  Administered 2018-10-06: 80 mg via INTRAVENOUS

## 2018-10-06 MED ORDER — LIDOCAINE HCL (CARDIAC) PF 100 MG/5ML IV SOSY
PREFILLED_SYRINGE | INTRAVENOUS | Status: AC
  Filled 2018-10-06: qty 5

## 2018-10-06 MED ORDER — PROPOFOL 10 MG/ML IV BOLUS
INTRAVENOUS | Status: DC | PRN
  Administered 2018-10-06: 200 mg via INTRAVENOUS

## 2018-10-06 MED ORDER — MIDAZOLAM HCL 5 MG/5ML IJ SOLN
INTRAMUSCULAR | Status: DC | PRN
  Administered 2018-10-06: 2 mg via INTRAVENOUS

## 2018-10-06 MED ORDER — OXYCODONE HCL 5 MG/5ML PO SOLN: 5.0000 mg | Freq: Once | ORAL | Status: DC | PRN

## 2018-10-06 MED ORDER — OXYCODONE HCL 5 MG PO TABS: 5.0000 mg | ORAL_TABLET | Freq: Once | ORAL | Status: DC | PRN

## 2018-10-06 MED ORDER — FENTANYL CITRATE (PF) 100 MCG/2ML IJ SOLN
INTRAMUSCULAR | Status: DC | PRN
  Administered 2018-10-06: 50 ug via INTRAVENOUS

## 2018-10-06 MED ORDER — ONDANSETRON HCL 4 MG/2ML IJ SOLN
INTRAMUSCULAR | Status: DC | PRN
  Administered 2018-10-06: 4 mg via INTRAVENOUS

## 2018-10-06 MED ORDER — BUPIVACAINE-EPINEPHRINE (PF) 0.5% -1:200000 IJ SOLN
INTRAMUSCULAR | Status: AC
  Filled 2018-10-06: qty 30

## 2018-10-06 MED ORDER — MIDAZOLAM HCL 2 MG/2ML IJ SOLN
INTRAMUSCULAR | Status: AC
  Filled 2018-10-06: qty 2

## 2018-10-06 MED ORDER — ACETAMINOPHEN 10 MG/ML IV SOLN
INTRAVENOUS | Status: AC
  Filled 2018-10-06: qty 100

## 2018-10-06 MED ORDER — BUPIVACAINE-EPINEPHRINE 0.5% -1:200000 IJ SOLN
INTRAMUSCULAR | Status: DC | PRN
  Administered 2018-10-06: 10 mL

## 2018-10-06 MED ORDER — PHENYLEPHRINE HCL 10 MG/ML IJ SOLN
INTRAMUSCULAR | Status: DC | PRN
  Administered 2018-10-06 (×4): 100 ug via INTRAVENOUS

## 2018-10-06 MED ORDER — DEXAMETHASONE SODIUM PHOSPHATE 10 MG/ML IJ SOLN
INTRAMUSCULAR | Status: DC | PRN
  Administered 2018-10-06: 4 mg via INTRAVENOUS

## 2018-10-06 MED ORDER — ONDANSETRON HCL 4 MG/2ML IJ SOLN
INTRAMUSCULAR | Status: AC
  Filled 2018-10-06: qty 2

## 2018-10-06 MED ORDER — SCOPOLAMINE 1 MG/3DAYS TD PT72
1.0000 | MEDICATED_PATCH | Freq: Once | TRANSDERMAL | Status: DC
  Administered 2018-10-06: 1.5 mg via TRANSDERMAL

## 2018-10-06 MED ORDER — SCOPOLAMINE 1 MG/3DAYS TD PT72
MEDICATED_PATCH | TRANSDERMAL | Status: AC
  Filled 2018-10-06: qty 1

## 2018-10-06 MED ORDER — PROMETHAZINE HCL 25 MG/ML IJ SOLN: 6.2500 mg | INTRAMUSCULAR | Status: DC | PRN

## 2018-10-06 MED ORDER — PROPOFOL 10 MG/ML IV BOLUS
INTRAVENOUS | Status: AC
  Filled 2018-10-06: qty 20

## 2018-10-06 SURGICAL SUPPLY — 18 items
ABLATOR SURESOUND NOVASURE (ABLATOR) ×3 IMPLANT
BIPOLAR CUTTING LOOP 21FR (ELECTRODE)
CATH ROBINSON RED A/P 16FR (CATHETERS) ×3 IMPLANT
ELECT COAG BIPOL BALL 21FR (ELECTRODE) IMPLANT
ELECT REM PT RETURN 9FT ADLT (ELECTROSURGICAL)
ELECTRODE REM PT RTRN 9FT ADLT (ELECTROSURGICAL) IMPLANT
GLOVE BIOGEL PI IND STRL 7.0 (GLOVE) ×1 IMPLANT
GLOVE BIOGEL PI IND STRL 8.5 (GLOVE) ×1 IMPLANT
GLOVE BIOGEL PI INDICATOR 7.0 (GLOVE) ×2
GLOVE BIOGEL PI INDICATOR 8.5 (GLOVE) ×2
GLOVE ECLIPSE 8.0 STRL XLNG CF (GLOVE) ×3 IMPLANT
GOWN STRL REUS W/TWL LRG LVL3 (GOWN DISPOSABLE) ×6 IMPLANT
HIBICLENS CHG 4% 4OZ BTL (MISCELLANEOUS) ×3 IMPLANT
KIT PROCEDURE FLUENT (KITS) ×3 IMPLANT
LOOP CUTTING BIPOLAR 21FR (ELECTRODE) IMPLANT
PACK VAGINAL MINOR WOMEN LF (CUSTOM PROCEDURE TRAY) ×3 IMPLANT
PAD OB MATERNITY 4.3X12.25 (PERSONAL CARE ITEMS) ×3 IMPLANT
TOWEL OR 17X24 6PK STRL BLUE (TOWEL DISPOSABLE) ×6 IMPLANT

## 2018-10-06 NOTE — Op Note (Signed)
[facility] Operative Procedure   NOVASURE ABLATION OF THE ENDOMETRIUM   Tracey Escobar  DOB:    29-Nov-1970  MRN:    [id number]  CSN:    [id number]  Date of Surgery:  10/06/2018  Preoperative Diagnosis:  Menorrhagia Atrial fibrillation Anticoagulation using Xarelto  Postoperative Diagnosis:  Menorrhagia Atrial fibrillation Anticoagulation using Xarelto  Procedure:  Hysteroscopy Dilation and curettage NovaSure Ablation of the Endometrium  Surgeon:  Leonard Schwartz, M.D.  Assistant:  none  Anesthetic:   General, paracervical block using 10 cc of half percent Marcaine with epinephrine  Disposition:  Ms. Tracey Escobar is a 48 y.o. female with the above mentioned diagnosis. She understands the indications for her surgical procedure as well as the alternative treatment options.  Her endometrial biopsy showed benign cells.  She understands that she should not become pregnant.  Her contraceptive method is: Depo-Provera.  She understands the potential benefits and potential risks associated with NovaSure ablation.  Her questions were answered.  She accepts those risks.  A permit has been signed.  Findings:  The uterus is approximately upper limits normal size.  No adnexal masses are appreciated.  The cervical length is 4.5 centimeters. The uterus sounded to 9 centimeters. The cavity length is 4.5 centimeters. The cavity width is 2.9 centimeters.  The urine pregnancy test is negative.  On hysteroscopy, the tubal ostia are seen bilaterally.  No endometrial masses are appreciated.  No pathology is seen.  Procedure:  The patient was taken to the operating room where a general anesthetic was given.  The patient was placed in a lithotomy position.  A pelvic exam and speculum exam were performed. The cervix was prepped with multiple layers of chlorhexidine with 4% alcohol.  A single-tooth tenaculum was placed on the cervix. A paracervical block  was placed using 10 cc of buffered half percent Marcaine.  The patient was felt to be adequately prepared for surgery.  An endocervical curettage was performed.  The cervix was sounded.  The uterus was sounded. The cervix was gently dilated.  The diagnostic hysteroscope was inserted into the uterine cavity.  The patient was noted to have normal tubal ostia bilaterally.  No pathology was appreciated.  The hysteroscope was removed.  The cavity was then curetted until it was felt to be clean with a medium sharp curette.  Hemostasis was adequate.  The NovaSure instrument was tested and was felt to be in proper operating condition.  The cavity length was set.  The NovaSure instrument was introduced into the uterus.  The cavity width was determined.  A test procedure was performed and the instrument passed.  The cavity was then ablated for 74 seconds at 72 W.  The patient tolerated her procedure well.  Hysteroscopy was repeated and the cavity was felt to be completely ablated.  Pictures were taken.  All instrument were removed.  The uterus was reexamined and was felt to be firm.  The patient was returned to the supine position.  She was awakened from her anesthetic without difficulty.  She was transported to the recovery room in stable condition.  The endocervical curettings and the endometrial curettings were sent to pathology.   Followup instructions:  Instructions were reviewed.  The patient will call for pain or fever or any problem that she is concerned about.  She was told to expect a vaginal discharge for 10 days to 3 weeks.  She was asked to refrain from intercourse until all of the discharge  has resolved.  Followup visit:  2 weeks  Discharge medications:  Acetaminophen 1000 mg every 6 hours as needed for mild to moderate pain. Percocet 1 tablet every 4 hours as needed for severe pain.  Leonard Schwartz, M.D. October 06, 2018 11:00 AM

## 2018-10-06 NOTE — Anesthesia Postprocedure Evaluation (Signed)
Anesthesia Post Note  Patient: HEYAM PRESHA  Procedure(s) Performed: DILATATION & CURETTAGE/HYSTEROSCOPY WITH NOVASURE ABLATION (N/A Vagina )     Patient location during evaluation: PACU Anesthesia Type: General Level of consciousness: awake and alert Pain management: pain level controlled Vital Signs Assessment: post-procedure vital signs reviewed and stable Respiratory status: spontaneous breathing, nonlabored ventilation and respiratory function stable Cardiovascular status: blood pressure returned to baseline and stable Postop Assessment: no apparent nausea or vomiting Anesthetic complications: no    Last Vitals:  Vitals:   10/06/18 1115 10/06/18 1130  BP: 120/64 136/65  Pulse: 77 76  Resp: 19 17  Temp:    SpO2: 95% 98%    Last Pain:  Vitals:   10/06/18 1115  TempSrc:   PainSc: 3    Pain Goal: Patients Stated Pain Goal: 5 (10/06/18 0859)               Beryle Lathe

## 2018-10-06 NOTE — Anesthesia Procedure Notes (Signed)
Procedure Name: LMA Insertion Date/Time: 10/06/2018 10:12 AM Performed by: Shanon Payor, CRNA Pre-anesthesia Checklist: Patient identified, Emergency Drugs available, Suction available, Patient being monitored and Timeout performed Patient Re-evaluated:Patient Re-evaluated prior to induction Oxygen Delivery Method: Circle system utilized Preoxygenation: Pre-oxygenation with 100% oxygen Induction Type: IV induction LMA: LMA inserted LMA Size: 4.0 Number of attempts: 1 Placement Confirmation: positive ETCO2 and breath sounds checked- equal and bilateral Dental Injury: Teeth and Oropharynx as per pre-operative assessment

## 2018-10-06 NOTE — Progress Notes (Signed)
The patient was interviewed and examined today.  The previously documented history and physical examination was reviewed. There are no changes. The operative procedure was reviewed. The risks and benefits were outlined again. The specific risks include, but are not limited to, anesthetic complications, bleeding, infections, and possible damage to the surrounding organs. The patient's questions were answered.  We are ready to proceed as outlined. The likelihood of the patient achieving the goals of this procedure is very likely.   BP 124/75   Pulse 87   Temp 98.2 F (36.8 C) (Oral)   Resp 20   LMP 09/26/2018   SpO2 100%  CBC    Component Value Date/Time   WBC 5.9 09/27/2018 1059   RBC 4.88 09/27/2018 1059   HGB 13.2 09/27/2018 1059   HGB 13.1 05/11/2017 1623   HCT 42.7 09/27/2018 1059   HCT 39.0 08/09/2018 0927   PLT 357 09/27/2018 1059   PLT 375 05/11/2017 1623   MCV 87.5 09/27/2018 1059   MCV 88 05/11/2017 1623   MCH 27.0 09/27/2018 1059   MCHC 30.9 09/27/2018 1059   RDW 14.6 09/27/2018 1059   RDW 14.0 05/11/2017 1623   LYMPHSABS 3.0 05/11/2017 1623   EOSABS 0.1 05/11/2017 1623   BASOSABS 0.0 05/11/2017 1623     Leonard Schwartz, M.D.

## 2018-10-06 NOTE — Transfer of Care (Signed)
Immediate Anesthesia Transfer of Care Note  Patient: Tracey Escobar  Procedure(s) Performed: DILATATION & CURETTAGE/HYSTEROSCOPY WITH NOVASURE ABLATION (N/A Vagina )  Patient Location: PACU  Anesthesia Type:General  Level of Consciousness: awake, alert  and oriented  Airway & Oxygen Therapy: Patient Spontanous Breathing and Patient connected to nasal cannula oxygen  Post-op Assessment: Report given to RN and Post -op Vital signs reviewed and stable  Post vital signs: Reviewed and stable  Last Vitals:  Vitals Value Taken Time  BP    Temp    Pulse    Resp    SpO2      Last Pain:  Vitals:   10/06/18 0859  TempSrc: Oral  PainSc: 5       Patients Stated Pain Goal: 5 (10/06/18 0859)  Complications: No apparent anesthesia complications

## 2018-10-06 NOTE — Discharge Instructions (Signed)
Endometrial Ablation, Care After °This sheet gives you information about how to care for yourself after your procedure. Your health care provider may also give you more specific instructions. If you have problems or questions, contact your health care provider. °What can I expect after the procedure? °After the procedure, it is common to have: °· A need to urinate more frequently than usual for the first 24 hours. °· Cramps similar to menstrual cramps. These may last for 1-2 days. °· Thin, watery vaginal discharge that is light pink or brown in color. This may last a few weeks. Discharge will be heavy for the first few days after your procedure. You may need to wear a sanitary pad. °· Nausea. °· Vaginal bleeding for 4-6 weeks after the procedure, as tissue healing occurs. °Follow these instructions at home: °Activity °· Do not drive for 24 hours if you were given amedicine to help you relax (sedative) during your procedure. °· Do not have sex or put anything into your vagina until your health care provider approves. °· Do not lift anything that is heavier than 10 lb (4.5 kg), or the limit that you are told, until your health care provider says that it is safe. °· Return to your normal activities as told by your health care provider. Ask your health care provider what activities are safe for you. °General instructions ° °· Take over-the-counter and prescription medicines only as told by your health care provider. °· Do not take baths, swim, or use a hot tub until your health care provider approves. You will be able to take showers. °· Check your vaginal area every day for signs of infection. Check for: °? Redness, swelling, or pain. °? More discharge or blood, instead of less. °? Bad-smelling discharge. °· Keep all follow-up visits as told by your health care provider. This is important. °· Drink enough fluid to keep your urine pale yellow. °Contact a health care provider if you have: °· Vaginal redness, swelling, or  pain. °· Vaginal discharge or bleeding that gets worse instead of getting better. °· Bad-smelling vaginal discharge. °· A fever or chills. °· Trouble urinating. °Get help right away if you have: °· Heavy vaginal bleeding. °· Severe cramps. °Summary °· After endometrial ablation, it is normal to have thin, watery vaginal discharge that is light pink or brown in color. This may last a few weeks and may be heavier right after the procedure. °· Vaginal bleeding is also normal after the procedure and should get better with time. °· Check your vaginal area every day for signs of infection, such as bad-smelling discharge. °· Keep all follow-up visits as told by your health care provider. This is important. °This information is not intended to replace advice given to you by your health care provider. Make sure you discuss any questions you have with your health care provider. °Document Released: 08/03/2017 Document Revised: 08/03/2017 Document Reviewed: 08/03/2017 °Elsevier Interactive Patient Education © 2019 [employer] ° ° ° °Post Anesthesia Home Care Instructions ° °Activity: °Get plenty of rest for the remainder of the day. A responsible individual must stay with you for 24 hours following the procedure.  °For the next 24 hours, DO NOT: °-Drive a car °-Operate machinery °-Drink alcoholic beverages °-Take any medication unless instructed by your physician °-Make any legal decisions or sign important papers. ° °Meals: °Start with liquid foods such as gelatin or soup. Progress to regular foods as tolerated. Avoid greasy, spicy, heavy foods. If nausea and/or vomiting occur, drink   only clear liquids until the nausea and/or vomiting subsides. Call your physician if vomiting continues. ° °Special Instructions/Symptoms: °Your throat may feel dry or sore from the anesthesia or the breathing tube placed in your throat during surgery. If this causes discomfort, gargle with warm salt water. The discomfort should disappear within  24 hours. ° °If you had a scopolamine patch placed behind your ear for the management of post- operative nausea and/or vomiting: ° °1. The medication in the patch is effective for 72 hours, after which it should be removed.  Wrap patch in a tissue and discard in the trash. Wash hands thoroughly with soap and water. °2. You may remove the patch earlier than 72 hours if you experience unpleasant side effects which may include dry mouth, dizziness or visual disturbances. °3. Avoid touching the patch. Wash your hands with soap and water after contact with the patch. °  ° ° °

## 2018-10-07 ENCOUNTER — Encounter (HOSPITAL_COMMUNITY): Payer: Self-pay | Admitting: Obstetrics and Gynecology

## 2018-10-07 ENCOUNTER — Other Ambulatory Visit: Payer: Self-pay

## 2018-10-17 ENCOUNTER — Telehealth: Payer: Self-pay | Admitting: Neurology

## 2018-10-17 DIAGNOSIS — G4733 Obstructive sleep apnea (adult) (pediatric): Secondary | ICD-10-CM | POA: Insufficient documentation

## 2018-10-17 NOTE — Telephone Encounter (Signed)
Encounter was placed under wrong name- therefor can not issue result note.:   PATIENT'S NAME:  Tracey Escobar, Tracey F. DOB:      07/02/1971      MR#:    [id number]     DATE OF RECORDING: 10/02/2018   Danley Dankerarlos G-A. REFERRING M.D.:  Dorothyann Pengobyn Sanders, MD  Study Performed:   Baseline Polysomnogram with expanded EEG HISTORY:  Tracey Escobar is a 48 y.o. female seizure patient, seen here as a RV on 08-09-2018 from Dr. Allyne GeeSanders for a new sleep consult.  She had been seen in 2017 for a sleep consult, but in the interval there have been medical developments.  Tracey Escobar was tested for apnea which was ruled out and an apnea link home sleep test from 12 August 2016.  Her AHI was 3.9 her RDI was 8/h, oxygen desaturation index was 5.9/h and she did not have prolonged hypoxemia time.   Meanwhile she had 2 hospitalizations in March 2018 and in March 2019 for atrial fibrillation- She continues to feel sleepy at work, has fallen asleep at her desk, is yawning all day, and having no energy to exercise.   Sleep medical history;" I have had sleep problems all my life. New diagnosis of atrial fib, Obesity is increased- over the last 1 year, BMI is now 46".  The patient endorsed the Epworth Sleepiness Scale at 10/24 points. FSS 47/63/ muscle cramps, sleep paralysis.    The patient's weight 295 pounds with a height of 67 (inches), resulting in a BMI of 46.4 kg/m2. The patient's neck circumference measured 14.5 inches.  CURRENT MEDICATIONS: Ventolin, Astelin, Vitamin D3, Restasis, Flonase, Advair, Hydrouricil, Xyzal, Linzess, Cozaar, Toprol XL, Singulair, Prilosec, Xeralto.   PROCEDURE:  This is a multichannel digital polysomnogram utilizing the Somnostar 11.2 system.  Electrodes and sensors were applied and monitored per AASM Specifications.   EEG, EOG, Chin and Limb EMG, were sampled at 200 Hz.  ECG, Snore and Nasal Pressure, Thermal Airflow, Respiratory Effort, CPAP Flow and Pressure, Oximetry was sampled at 50 Hz. Digital  video and audio were recorded.      BASELINE STUDY: Lights Out was at 20:57 and Lights On at 05:09.  Total recording time (TRT) was 492 minutes, with a total sleep time (TST) of 464 minutes.   The patient's sleep latency was 4.5 minutes.  REM latency was 125 minutes.  The sleep efficiency was 95 %.     SLEEP ARCHITECTURE: WASO (Wake after sleep onset) was 23 minutes.  There were 4 minutes in Stage N1, 380.5 minutes Stage N2, 10.5 minutes Stage N3 and 69 minutes in Stage REM.  The percentage of Stage N1 was 0.9%, Stage N2 was 82.%, Stage N3 was 2.3% and Stage R (REM sleep) was 14.9%.   RESPIRATORY ANALYSIS:  There were a total of 75 respiratory events:  0 apneas and 75 hypopneas with a hypopnea index of 9.7 /hour. The patient also had many respiratory event related arousals (RERAs). The total APNEA/HYPOPNEA INDEX (AHI) was 9.7 /hour. 17 events occurred in REM sleep and 116 events in NREM. The REM AHI was 14.8 /hour, versus a non-REM AHI of 8.8/h. The patient spent 146 minutes of total sleep time in the supine position and 318 minutes in non-supine. The supine AHI was 25.1 versus a non-supine AHI of 2.6. OXYGEN SATURATION & C02:  The Wake baseline 02 saturation was 96%, with the lowest being 88%. Time spent below 89% saturation equaled 0 minutes.   AROUSALS/ PERIODIC LIMB MOVEMENTS:  The  arousals were noted as: 48 were spontaneous, 14 were associated with PLMs, and 17 were associated with respiratory events. The patient had a total of 29 Periodic Limb Movements.  The Periodic Limb Movement (PLM) index was 3.8 and the PLM Arousal index was 1.8/hour. Audio and video analysis did not show any abnormal or unusual movements, behaviors, phonations or vocalizations.  The patient took bathroom breaks. Snoring was mild. EKG was in keeping with normal sinus rhythm (NSR), not atrial fib. Post-study, the patient indicated that sleep in the laboratory was difficult for her     IMPRESSION:  1. Mild Obstructive  Sleep Apnea (OSA) dependent on Sleep position. General AHI of 9.7/h and REM AHI of 14.8/h, supine AHI was 25.1/h. with no oxygen desaturations.  2. Mild - moderate Periodic Limb Movement Disorder (PLMD). 3. Mild Snoring. 4. Normal EKG. 5. Very high sleep efficiency at 95%- no evidence of insomnia.   RECOMMENDATIONS:  1. The patient can chose to avoid the supine sleep position, lose weight and use a dental device, or try auto CPAP. The apnea is mild and uncomplicated, yet treatment is important in a patient identified with paroxysmal atrial fibrillation. 2. PCP may assist with referral for medical weight management/ surgical weight management. 3. NP at [facility] can discuss tennis ball method, arrange for dental referral or CPAP auto 5-12 cm water, 3 cm EPR, nasal pillow, Swift Bella model.     I certify that I have reviewed the entire raw data recording prior to the issuance of this report in accordance with the Standards of Accreditation of the American Academy of Sleep Medicine (AASM)     Melvyn Novas, MD    10-17-2018 Diplomat, American Board of Psychiatry and Neurology  Diplomat, American Board of Sleep Medicine Medical Director, [facility]

## 2018-10-17 NOTE — Procedures (Signed)
PATIENT'S NAME:  Tracey Escobar, Tracey Escobar DOB:      01-Feb-1971      MR#:    [id number]     DATE OF RECORDING: 10/02/2018   [facility] REFERRING M.D.:  Dorothyann Peng, MD Study Performed:   Baseline Polysomnogram with expanded EEG HISTORY:  Tracey Escobar is a 48 y.o. female seizure patient, seen here as a RV on 08-09-2018 from Dr. Allyne Gee for a new sleep consult.  She had been seen in 2017 for a sleep consult, but in the interval there have been medical developments.  Tracey Escobar was tested for apnea which was ruled out and an apnea link home sleep test from 12 August 2016.  Her AHI was 3.9 her RDI was 8/h, oxygen desaturation index was 5.9/h and she did not have prolonged hypoxemia time.   Meanwhile she had 2 hospitalizations in March 2018 and in March 2019 for atrial fibrillation- She continues to feel sleepy at work, has fallen asleep at her desk, is yawning all day, and having no energy to exercise.   Sleep medical history;" I have had sleep problems all my life. New diagnosis of atrial fib, Obesity is increased- over the last 1 year, BMI is now 46".  The patient endorsed the Epworth Sleepiness Scale at 10/24 points. FSS 47/63/ muscle cramps, sleep paralysis.    The patient's weight 295 pounds with a height of 67 (inches), resulting in a BMI of 46.4 kg/m2. The patient's neck circumference measured 14.5 inches.  CURRENT MEDICATIONS: Ventolin, Astelin, Vitamin D3, Restasis, Flonase, Advair, Hydrouricil, Xyzal, Linzess, Cozaar, Toprol XL, Singulair, Prilosec, Xeralto.   PROCEDURE:  This is a multichannel digital polysomnogram utilizing the Somnostar 11.2 system.  Electrodes and sensors were applied and monitored per AASM Specifications.   EEG, EOG, Chin and Limb EMG, were sampled at 200 Hz.  ECG, Snore and Nasal Pressure, Thermal Airflow, Respiratory Effort, CPAP Flow and Pressure, Oximetry was sampled at 50 Hz. Digital video and audio were recorded.      BASELINE STUDY: Lights Out was at 20:57 and Lights On  at 05:09.  Total recording time (TRT) was 492 minutes, with a total sleep time (TST) of 464 minutes.   The patient's sleep latency was 4.5 minutes.  REM latency was 125 minutes.  The sleep efficiency was 95 %.     SLEEP ARCHITECTURE: WASO (Wake after sleep onset) was 23 minutes.  There were 4 minutes in Stage N1, 380.5 minutes Stage N2, 10.5 minutes Stage N3 and 69 minutes in Stage REM.  The percentage of Stage N1 was 0.9%, Stage N2 was 82.%, Stage N3 was 2.3% and Stage R (REM sleep) was 14.9%.   RESPIRATORY ANALYSIS:  There were a total of 75 respiratory events:  0 apneas and 75 hypopneas with a hypopnea index of 9.7 /hour. The patient also had many respiratory event related arousals (RERAs). The total APNEA/HYPOPNEA INDEX (AHI) was 9.7 /hour. 17 events occurred in REM sleep and 116 events in NREM. The REM AHI was 14.8 /hour, versus a non-REM AHI of 8.8/h. The patient spent 146 minutes of total sleep time in the supine position and 318 minutes in non-supine. The supine AHI was 25.1 versus a non-supine AHI of 2.6. OXYGEN SATURATION & C02:  The Wake baseline 02 saturation was 96%, with the lowest being 88%. Time spent below 89% saturation equaled 0 minutes.   AROUSALS/ PERIODIC LIMB MOVEMENTS:  The arousals were noted as: 48 were spontaneous, 14 were associated with PLMs, and 17 were associated  with respiratory events. The patient had a total of 29 Periodic Limb Movements.  The Periodic Limb Movement (PLM) index was 3.8 and the PLM Arousal index was 1.8/hour. Audio and video analysis did not show any abnormal or unusual movements, behaviors, phonations or vocalizations.  The patient took bathroom breaks. Snoring was mild. EKG was in keeping with normal sinus rhythm (NSR), not atrial fib. Post-study, the patient indicated that sleep in the laboratory was difficult for her     IMPRESSION:  1. Mild Obstructive Sleep Apnea (OSA) dependent on Sleep position. General AHI of 9.7/h and REM AHI of 14.8/h,  supine AHI was 25.1/h. with no oxygen desaturations.  2. Mild - moderate Periodic Limb Movement Disorder (PLMD). 3. Mild Snoring. 4. Normal EKG. 5. Very high sleep efficiency at 95%- no evidence of insomnia.   RECOMMENDATIONS:  1. The patient can chose to avoid the supine sleep position, lose weight and use a dental device, or try auto CPAP. The apnea is mild and uncomplicated, yet treatment is important in a patient identified with paroxysmal atrial fibrillation. 2. PCP may assist with referral for medical weight management/ surgical weight management. 3. NP at [facility] can discuss tennis ball method, arrange for dental referral or CPAP auto 5-12 cm water, 3 cm EPR, nasal pillow, Swift Bella model.     I certify that I have reviewed the entire raw data recording prior to the issuance of this report in accordance with the Standards of Accreditation of the American Academy of Sleep Medicine (AASM)     Melvyn Novasarmen Leeza Heiner, MD    10-17-2018 Diplomat, American Board of Psychiatry and Neurology  Diplomat, American Board of Sleep Medicine Medical Director, [facility]

## 2018-10-18 ENCOUNTER — Encounter: Payer: Self-pay | Admitting: Neurology

## 2018-10-18 NOTE — Telephone Encounter (Signed)
Called patient to discuss sleep study results. No answer at this time. LVM for the patient to call back.   

## 2018-10-18 NOTE — Telephone Encounter (Signed)
Made 2nd attempt to call. No answer.

## 2018-10-19 ENCOUNTER — Ambulatory Visit: Payer: 59 | Admitting: Allergy and Immunology

## 2018-10-19 ENCOUNTER — Encounter: Payer: Self-pay | Admitting: Allergy and Immunology

## 2018-10-19 ENCOUNTER — Other Ambulatory Visit: Payer: Self-pay | Admitting: Neurology

## 2018-10-19 VITALS — BP 106/62 | HR 90 | Temp 98.1°F | Resp 16 | Ht 68.0 in | Wt 288.4 lb

## 2018-10-19 DIAGNOSIS — K219 Gastro-esophageal reflux disease without esophagitis: Secondary | ICD-10-CM

## 2018-10-19 DIAGNOSIS — J0100 Acute maxillary sinusitis, unspecified: Secondary | ICD-10-CM

## 2018-10-19 DIAGNOSIS — J3089 Other allergic rhinitis: Secondary | ICD-10-CM

## 2018-10-19 DIAGNOSIS — J45901 Unspecified asthma with (acute) exacerbation: Secondary | ICD-10-CM

## 2018-10-19 DIAGNOSIS — G4733 Obstructive sleep apnea (adult) (pediatric): Secondary | ICD-10-CM

## 2018-10-19 MED ORDER — OLOPATADINE HCL 0.7 % OP SOLN: 1.0000 [drp] | Freq: Every day | OPHTHALMIC | 5 refills | Status: DC

## 2018-10-19 MED ORDER — ALBUTEROL SULFATE 108 (90 BASE) MCG/ACT IN AEPB: 2.0000 | INHALATION_SPRAY | RESPIRATORY_TRACT | 1 refills | Status: DC | PRN

## 2018-10-19 MED ORDER — FLUTICASONE-SALMETEROL 230-21 MCG/ACT IN AERO: 2.0000 | INHALATION_SPRAY | Freq: Two times a day (BID) | RESPIRATORY_TRACT | 5 refills | Status: DC

## 2018-10-19 MED ORDER — EPINEPHRINE 0.3 MG/0.3ML IJ SOAJ: INTRAMUSCULAR | 1 refills | Status: DC

## 2018-10-19 MED ORDER — ALBUTEROL SULFATE (2.5 MG/3ML) 0.083% IN NEBU: 2.5000 mg | INHALATION_SOLUTION | Freq: Four times a day (QID) | RESPIRATORY_TRACT | 1 refills | Status: DC | PRN

## 2018-10-19 NOTE — Telephone Encounter (Signed)
I called pt. I advised pt that Dr. Vickey Huger reviewed their sleep study results and found that pt has sleep apnea. Dr. Vickey Huger recommends that pt start auto CPAP,dental device or weight loss.The patient chose to try the auto CPAP 5-12 cm water pressure. I reviewed PAP compliance expectations with the pt. Pt is agreeable to starting a CPAP. I advised pt that an order will be sent to a DME, [facility], and [facility] will call the pt within about one week after they file with the pt's insurance. [facility] will show the pt how to use the machine, fit for masks, and troubleshoot the CPAP if needed. A follow up appt was made for insurance purposes with Jessica,NP on April 3,2020 at 11:00 am. Pt verbalized understanding to arrive 15 minutes early and bring their CPAP. A letter with all of this information in it will be mailed to the pt as a reminder. I verified with the pt that the address we have on file is correct. Pt verbalized understanding of results. Pt had no questions at this time but was encouraged to call back if questions arise. I have sent the order to [facility] and have received confirmation that they have received the order.

## 2018-10-19 NOTE — Patient Instructions (Addendum)
Asthma with acute exacerbation  Prednisone has been provided, 40 mg x3 days, 20 mg x 3 days, 10 mg x 3 days, then stop.  We will switch from dry powder inhaler to [facility].    A prescription has been provided for Advair 230/21 g, 2 inhalations twice a day. To maximize pulmonary deposition, a spacer has been provided along with instructions for its proper administration with an HFA inhaler.  Continue montelukast 10 mg daily bedtime and albuterol HFA, 1-2 inhalations every 4-6 hours as needed.  The patient has been asked to contact us if her symptoms persist or progress. Otherwise, she may return for follow up in 4 months.  Acute sinusitis  Prednisone has been provided (as above).    Continue nasal saline irrigation and fluticasone nasal spray.  I have recommended using fluticasone nasal spray twice daily for now.  Nasal saline lavage (NeilMed) has been recommended prior to medicated nasal sprays and as needed along with instructions for proper administration.  For thick post nasal drainage, add guaifenesin 1200 mg (Mucinex Maximum Strength)  twice daily as needed with adequate hydration as discussed.  The patient's mother has been asked to contact me if her symptoms persist, progress, or if she becomes febrile.  If she becomes febrile or produces discolored mucus, we will start an antibiotic.  Allergic rhinitis  Continue appropriate allergen avoidance measures.  Treatment plan as outlined above for acute sinusitis.   Return in about 4 months (around 02/17/2019), or if symptoms worsen or fail to improve.

## 2018-10-19 NOTE — Assessment & Plan Note (Signed)
   Continue appropriate allergen avoidance measures.  Treatment plan as outlined above for acute sinusitis. 

## 2018-10-19 NOTE — Assessment & Plan Note (Addendum)
   Prednisone has been provided, 40 mg x3 days, 20 mg x 3 days, 10 mg x 3 days, then stop.  We will switch from dry powder inhaler to [facility].    A prescription has been provided for Advair 230/21 g, 2 inhalations twice a day. To maximize pulmonary deposition, a spacer has been provided along with instructions for its proper administration with an HFA inhaler.  Continue montelukast 10 mg daily bedtime and albuterol HFA, 1-2 inhalations every 4-6 hours as needed.  The patient has been asked to contact us if her symptoms persist or progress. Otherwise, she may return for follow up in 4 months.

## 2018-10-19 NOTE — Assessment & Plan Note (Addendum)
   Prednisone has been provided (as above).    Continue nasal saline irrigation and fluticasone nasal spray.  I have recommended using fluticasone nasal spray twice daily for now.  Nasal saline lavage (NeilMed) has been recommended prior to medicated nasal sprays and as needed along with instructions for proper administration.  For thick post nasal drainage, add guaifenesin 1200 mg (Mucinex Maximum Strength)  twice daily as needed with adequate hydration as discussed.  The patient's mother has been asked to contact me if her symptoms persist, progress, or if she becomes febrile.  If she becomes febrile or produces discolored mucus, we will start an antibiotic.

## 2018-10-19 NOTE — Progress Notes (Signed)
Follow-up Note  RE: Tracey Escobar MRN: [id number] DOB: [birth year] Date of Office Visit: 10/19/2018  Primary care provider: Dorothyann Peng, MD Referring provider: Dorothyann Peng, MD  History of present illness: Tracey Escobar is a 48 y.o. female with persistent asthma, allergic rhinitis, and esophageal reflux presenting today for a sick visit.  She was last seen in this clinic in December 2018.  She reports that over the past 2 or 3 weeks she has experienced frequent chest tightness and dyspnea despite compliance with Advair 500/50 g Diskus, 1 inhalation twice daily.  She has also been experiencing nasal congestion, thick postnasal drainage, and sinus pressure over the past 2 weeks.  She denies fevers and discolored mucus production.  She believes that her upper and lower respiratory symptoms have been exacerbated by the rapid weather changes recently.  Assessment and plan: Asthma with acute exacerbation  Prednisone has been provided, 40 mg x3 days, 20 mg x 3 days, 10 mg x 3 days, then stop.  We will switch from dry powder inhaler to [facility].    A prescription has been provided for Advair 230/21 g, 2 inhalations twice a day. To maximize pulmonary deposition, a spacer has been provided along with instructions for its proper administration with an HFA inhaler.  Continue montelukast 10 mg daily bedtime and albuterol HFA, 1-2 inhalations every 4-6 hours as needed.  The patient has been asked to contact us if her symptoms persist or progress. Otherwise, she may return for follow up in 4 months.  Acute sinusitis  Prednisone has been provided (as above).    Continue nasal saline irrigation and fluticasone nasal spray.  I have recommended using fluticasone nasal spray twice daily for now.  Nasal saline lavage (NeilMed) has been recommended prior to medicated nasal sprays and as needed along with instructions for proper administration.  For thick post nasal drainage, add guaifenesin 1200  mg (Mucinex Maximum Strength)  twice daily as needed with adequate hydration as discussed.  The patient's mother has been asked to contact me if her symptoms persist, progress, or if she becomes febrile.  If she becomes febrile or produces discolored mucus, we will start an antibiotic.  Allergic rhinitis  Continue appropriate allergen avoidance measures.  Treatment plan as outlined above for acute sinusitis.   Meds ordered this encounter  Medications  . Albuterol Sulfate (PROAIR RESPICLICK) 108 (90 Base) MCG/ACT AEPB    Sig: Inhale 2 puffs into the lungs every 4 (four) hours as needed.    Dispense:  1 each    Refill:  1  . EPINEPHrine 0.3 mg/0.3 mL IJ SOAJ injection    Sig: Use as directed for anaphylaxis    Dispense:  2 Device    Refill:  1    Dispense mylan brand  NDC [id number]  . Olopatadine HCl (PAZEO) 0.7 % SOLN    Sig: Place 1 drop into both eyes daily.    Dispense:  1 Bottle    Refill:  5    For itchy eyes.  . fluticasone-salmeterol (ADVAIR HFA) 230-21 MCG/ACT inhaler    Sig: Inhale 2 puffs into the lungs 2 (two) times daily.    Dispense:  1 Inhaler    Refill:  5  . albuterol (PROVENTIL) (2.5 MG/3ML) 0.083% nebulizer solution    Sig: Take 3 mLs (2.5 mg total) by nebulization every 6 (six) hours as needed for wheezing or shortness of breath.    Dispense:  75 mL    Refill:  1  Diagnostics: Spirometry reveals an FVC of 2.30 L and an FEV1 of one-point once a 67% predicted) without significant postbronchodilator improvement.  Please see scanned spirometry results for details.    Physical examination: Blood pressure 106/62, pulse 90, temperature 98.1 F (36.7 C), temperature source Oral, resp. rate 16, height 5\' 8"  (1.727 m), weight 288 lb 6.4 oz (130.8 kg), last menstrual period 09/26/2018.  General: Alert, interactive, in no acute distress. HEENT: TMs pearly gray, turbinates edematous with thick discharge, post-pharynx moderately erythematous. Neck: Supple  without lymphadenopathy. Lungs: Clear to auscultation without wheezing, rhonchi or rales. CV: Normal S1, S2 without murmurs. Skin: Warm and dry, without lesions or rashes.  The following portions of the patient's history were reviewed and updated as appropriate: allergies, current medications, past family history, past medical history, past social history, past surgical history and problem list.  Allergies as of 10/19/2018   No Known Allergies     Medication List       Accurate as of October 19, 2018  4:59 PM. Always use your most recent med list.        acetaminophen 500 MG tablet Commonly known as:  TYLENOL Take 1 tablet (500 mg total) by mouth every 6 (six) hours as needed for moderate pain (Take 2 tablets by mouth (1000 mg total) every 6 hours as needed for moderate pain (over the counter is fine)(max dose from all sources not to exceed 4 grams)).   Albuterol Sulfate 108 (90 Base) MCG/ACT Aepb Commonly known as:  PROAIR RESPICLICK Inhale 2 puffs into the lungs every 4 (four) hours as needed.   albuterol (2.5 MG/3ML) 0.083% nebulizer solution Commonly known as:  PROVENTIL Take 3 mLs (2.5 mg total) by nebulization every 6 (six) hours as needed for wheezing or shortness of breath.   calcium carbonate 1250 (500 Ca) MG tablet Commonly known as:  OS-CAL - dosed in mg of elemental calcium Take 1 tablet by mouth daily with breakfast.   cycloSPORINE 0.05 % ophthalmic emulsion Commonly known as:  RESTASIS Place 1 drop into both eyes 2 (two) times daily.   EPINEPHrine 0.3 mg/0.3 mL Soaj injection Commonly known as:  EPI-PEN Use as directed for anaphylaxis   fluticasone 50 MCG/ACT nasal spray Commonly known as:  FLONASE 2 sprays per nostril once daily for stuffy nose.   Fluticasone-Salmeterol 500-50 MCG/DOSE Aepb Commonly known as:  ADVAIR DISKUS Inhale 1 puff into the lungs 2 (two) times daily. Rinse gargle and spit after use.   fluticasone-salmeterol 230-21 MCG/ACT  inhaler Commonly known as:  ADVAIR HFA Inhale 2 puffs into the lungs 2 (two) times daily.   hydrochlorothiazide 12.5 MG tablet Commonly known as:  HYDRODIURIL TAKE 1 TABLET BY MOUTH EVERY DAY   levocetirizine 5 MG tablet Commonly known as:  XYZAL TAKE 1 TABLET BY MOUTH EVERY DAY IN THE EVENING   LINZESS 145 MCG Caps capsule Generic drug:  linaclotide TAKE ONE CAPSULE BY MOUTH EVERY DAY ON AN EMPTY STOMACH AT LEAST 30 MINUTES BEFORE 1ST MEAL OF THE DAY   losartan 25 MG tablet Commonly known as:  COZAAR Take 25 mg by mouth daily.   Magnesium 250 MG Tabs Take 250 mg by mouth daily.   medroxyPROGESTERone Acetate 150 MG/ML Susy every 3 (three) months.   metoprolol succinate 100 MG 24 hr tablet Commonly known as:  TOPROL-XL Take 100 mg by mouth daily.   montelukast 10 MG tablet Commonly known as:  SINGULAIR Take 1 tablet by mouth each evening for coughing or  wheezing.   Olopatadine HCl 0.7 % Soln Commonly known as:  PAZEO Place 1 drop into both eyes daily.   oxyCODONE-acetaminophen 10-325 MG tablet Commonly known as:  PERCOCET Take 1 tablet by mouth every 4 (four) hours as needed for pain.   pantoprazole 40 MG tablet Commonly known as:  PROTONIX Take 40 mg by mouth daily.   rivaroxaban 20 MG Tabs tablet Commonly known as:  XARELTO Take 20 mg by mouth daily with supper.   Vitamin D3 125 MCG (5000 UT) Tabs Take 1 tablet by mouth daily.       No Known Allergies  Review of systems: Review of systems negative except as noted in HPI / PMHx or noted below: Constitutional: Negative.  HENT: Negative.   Eyes: Negative.  Respiratory: Negative.   Cardiovascular: Negative.  Gastrointestinal: Negative.  Genitourinary: Negative.  Musculoskeletal: Negative.  Neurological: Negative.  Endo/Heme/Allergies: Negative.  Cutaneous: Negative.  Past Medical History:  Diagnosis Date  . Arthritis   . Asthma   . Atrial fibrillation (HCC)   . Dysrhythmia    A-Fib  . GERD  (gastroesophageal reflux disease)   . Hypertension   . Irritable bowel   . Pneumonia   . Sleep apnea   . Sleep apnea   . Vitamin D deficiency     Family History  Problem Relation Age of Onset  . Hyperlipidemia Mother   . Hypertension Mother   . Hyperlipidemia Father   . Hypertension Father   . COPD Father   . Heart failure Father   . Diabetes Brother   . Hypertension Maternal Grandmother   . Seizures Maternal Grandmother   . Cancer Paternal Grandfather   . Cancer Brother   . Allergic rhinitis Neg Hx   . Angioedema Neg Hx   . Asthma Neg Hx   . Eczema Neg Hx   . Immunodeficiency Neg Hx   . Urticaria Neg Hx     Social History   Socioeconomic History  . Marital status: Single    Spouse name: Not on file  . Number of children: Not on file  . Years of education: Not on file  . Highest education level: Not on file  Occupational History  . Not on file  Social Needs  . Financial resource strain: Not on file  . Food insecurity:    Worry: Not on file    Inability: Not on file  . Transportation needs:    Medical: Not on file    Non-medical: Not on file  Tobacco Use  . Smoking status: Never Smoker  . Smokeless tobacco: Never Used  Substance and Sexual Activity  . Alcohol use: Yes    Alcohol/week: 1.0 standard drinks    Types: 1 Glasses of wine per week    Comment: rare use  . Drug use: No  . Sexual activity: Never  Lifestyle  . Physical activity:    Days per week: Not on file    Minutes per session: Not on file  . Stress: Not on file  Relationships  . Social connections:    Talks on phone: Not on file    Gets together: Not on file    Attends religious service: Not on file    Active member of club or organization: Not on file    Attends meetings of clubs or organizations: Not on file    Relationship status: Not on file  . Intimate partner violence:    Fear of current or ex partner: Not on file  Emotionally abused: Not on file    Physically abused: Not on  file    Forced sexual activity: Not on file  Other Topics Concern  . Not on file  Social History Narrative  . Not on file     I appreciate the opportunity to take part in Kaysia's care. Please do not hesitate to contact me with questions.  Sincerely,   R. Jorene Guestarter Gavin Telford, MD

## 2018-10-20 ENCOUNTER — Other Ambulatory Visit: Payer: Self-pay

## 2018-10-20 ENCOUNTER — Ambulatory Visit: Payer: 59 | Admitting: Neurology

## 2018-10-20 MED ORDER — OLOPATADINE HCL 0.2 % OP SOLN: 1.0000 [drp] | Freq: Every day | OPHTHALMIC | 5 refills | Status: DC | PRN

## 2018-10-21 ENCOUNTER — Telehealth: Payer: Self-pay

## 2018-10-21 ENCOUNTER — Other Ambulatory Visit: Payer: Self-pay

## 2018-10-21 NOTE — Telephone Encounter (Signed)
Lm for pt to call us back, per Dr Nunzio Cobbs pt can re start immunotherapy. Will have to order new vials so schedule for 2-3 weeks out.

## 2018-11-03 DIAGNOSIS — J301 Allergic rhinitis due to pollen: Secondary | ICD-10-CM

## 2018-11-03 NOTE — Progress Notes (Signed)
VIALS EXP 10-25-2019

## 2018-11-04 DIAGNOSIS — J3089 Other allergic rhinitis: Secondary | ICD-10-CM | POA: Diagnosis not present

## 2018-11-09 ENCOUNTER — Encounter: Payer: Self-pay | Admitting: Internal Medicine

## 2018-11-10 ENCOUNTER — Other Ambulatory Visit: Payer: Self-pay

## 2018-11-13 ENCOUNTER — Other Ambulatory Visit: Payer: Self-pay | Admitting: Internal Medicine

## 2018-11-14 ENCOUNTER — Other Ambulatory Visit: Payer: Self-pay

## 2018-11-14 DIAGNOSIS — H1010 Acute atopic conjunctivitis, unspecified eye: Secondary | ICD-10-CM

## 2018-11-14 DIAGNOSIS — J309 Allergic rhinitis, unspecified: Secondary | ICD-10-CM

## 2018-11-14 DIAGNOSIS — J45901 Unspecified asthma with (acute) exacerbation: Secondary | ICD-10-CM

## 2018-11-14 MED ORDER — MONTELUKAST SODIUM 10 MG PO TABS: ORAL_TABLET | ORAL | 5 refills | Status: DC

## 2018-11-14 NOTE — Telephone Encounter (Signed)
RF for montelukast x1 with 5 refills at [facility] 

## 2018-11-16 ENCOUNTER — Ambulatory Visit: Payer: 59 | Admitting: Internal Medicine

## 2018-11-16 ENCOUNTER — Encounter: Payer: Self-pay | Admitting: Internal Medicine

## 2018-11-16 VITALS — BP 116/70 | HR 80 | Temp 98.6°F | Ht 66.6 in | Wt 282.8 lb

## 2018-11-16 DIAGNOSIS — M25561 Pain in right knee: Secondary | ICD-10-CM

## 2018-11-16 DIAGNOSIS — Z6841 Body Mass Index (BMI) 40.0 and over, adult: Secondary | ICD-10-CM

## 2018-11-16 DIAGNOSIS — I119 Hypertensive heart disease without heart failure: Secondary | ICD-10-CM | POA: Diagnosis not present

## 2018-11-16 DIAGNOSIS — I480 Paroxysmal atrial fibrillation: Secondary | ICD-10-CM | POA: Diagnosis not present

## 2018-11-16 DIAGNOSIS — Z0000 Encounter for general adult medical examination without abnormal findings: Secondary | ICD-10-CM

## 2018-11-16 LAB — POCT URINALYSIS DIPSTICK
Glucose, UA: NEGATIVE
LEUKOCYTES UA: NEGATIVE
Nitrite, UA: NEGATIVE
PH UA: 5.5 (ref 5.0–8.0)
Protein, UA: NEGATIVE
RBC UA: NEGATIVE
Spec Grav, UA: 1.025 (ref 1.010–1.025)
Urobilinogen, UA: 1 E.U./dL

## 2018-11-16 LAB — POCT UA - MICROALBUMIN
Albumin/Creatinine Ratio, Urine, POC: 300
Creatinine, POC: 300 mg/dL
Microalbumin Ur, POC: 80 mg/L

## 2018-11-16 NOTE — Patient Instructions (Signed)

## 2018-11-17 ENCOUNTER — Ambulatory Visit (INDEPENDENT_AMBULATORY_CARE_PROVIDER_SITE_OTHER): Payer: 59

## 2018-11-17 ENCOUNTER — Ambulatory Visit: Payer: Self-pay

## 2018-11-17 DIAGNOSIS — J309 Allergic rhinitis, unspecified: Secondary | ICD-10-CM

## 2018-11-17 LAB — CMP14+EGFR
ALT: 12 IU/L (ref 0–32)
AST: 11 IU/L (ref 0–40)
Albumin/Globulin Ratio: 1.2 (ref 1.2–2.2)
Albumin: 4.1 g/dL (ref 3.8–4.8)
Alkaline Phosphatase: 93 IU/L (ref 39–117)
BUN/Creatinine Ratio: 10 (ref 9–23)
BUN: 9 mg/dL (ref 6–24)
Bilirubin Total: 0.4 mg/dL (ref 0.0–1.2)
CO2: 21 mmol/L (ref 20–29)
Calcium: 10.3 mg/dL — ABNORMAL HIGH (ref 8.7–10.2)
Chloride: 102 mmol/L (ref 96–106)
Creatinine, Ser: 0.88 mg/dL (ref 0.57–1.00)
GFR calc Af Amer: 90 mL/min/{1.73_m2} (ref >59)
GFR calc non Af Amer: 78 mL/min/{1.73_m2} (ref >59)
Globulin, Total: 3.3 g/dL (ref 1.5–4.5)
Glucose: 80 mg/dL (ref 65–99)
Potassium: 3.6 mmol/L (ref 3.5–5.2)
Sodium: 141 mmol/L (ref 134–144)
Total Protein: 7.4 g/dL (ref 6.0–8.5)

## 2018-11-17 LAB — CBC
Hematocrit: 40.3 % (ref 34.0–46.6)
Hemoglobin: 13.1 g/dL (ref 11.1–15.9)
MCH: 27 pg (ref 26.6–33.0)
MCHC: 32.5 g/dL (ref 31.5–35.7)
MCV: 83 fL (ref 79–97)
Platelets: 401 10*3/uL (ref 150–450)
RBC: 4.85 x10E6/uL (ref 3.77–5.28)
RDW: 13.3 % (ref 11.7–15.4)
WBC: 6.8 10*3/uL (ref 3.4–10.8)

## 2018-11-17 LAB — LIPID PANEL
Chol/HDL Ratio: 3.1 ratio (ref 0.0–4.4)
Cholesterol, Total: 138 mg/dL (ref 100–199)
HDL: 44 mg/dL (ref >39)
LDL Calculated: 78 mg/dL (ref 0–99)
Triglycerides: 80 mg/dL (ref 0–149)
VLDL Cholesterol Cal: 16 mg/dL (ref 5–40)

## 2018-11-17 LAB — HEMOGLOBIN A1C
Est. average glucose Bld gHb Est-mCnc: 117 mg/dL
Hgb A1c MFr Bld: 5.7 % — ABNORMAL HIGH (ref 4.8–5.6)

## 2018-11-19 NOTE — Progress Notes (Signed)
Subjective:     Patient ID: Tracey Escobar , female    DOB: 11-28-1970 , 48 y.o.   MRN: [id number]   Chief Complaint  Patient presents with  . Annual Exam  . Hypertension    HPI  She is here today for a full physical examination. She is followed by GYN for her pelvic examinations. She has been seen within the past year. She has no specific concerns or complaints at this time.   Hypertension  This is a chronic problem. The current episode started more than 1 year ago. The problem has been gradually improving since onset. The problem is controlled. Pertinent negatives include no blurred vision, chest pain, palpitations or shortness of breath. Risk factors for coronary artery disease include obesity.   She reports compliance with meds.   Past Medical History:  Diagnosis Date  . Arthritis   . Asthma   . Atrial fibrillation ([place])   . Dysrhythmia    A-Fib  . GERD (gastroesophageal reflux disease)   . Hypertension   . Irritable bowel   . Pneumonia   . Sleep apnea   . Sleep apnea   . Vitamin D deficiency      Family History  Problem Relation Age of Onset  . Hyperlipidemia Mother   . Hypertension Mother   . Hyperlipidemia Father   . Hypertension Father   . COPD Father   . Heart failure Father   . Diabetes Brother   . Hypertension Maternal Grandmother   . Seizures Maternal Grandmother   . Cancer Paternal Grandfather   . Cancer Brother   . Allergic rhinitis Neg Hx   . Angioedema Neg Hx   . Asthma Neg Hx   . Eczema Neg Hx   . Immunodeficiency Neg Hx   . Urticaria Neg Hx      Current Outpatient Medications:  .  albuterol (PROVENTIL) (2.5 MG/3ML) 0.083% nebulizer solution, Take 3 mLs (2.5 mg total) by nebulization every 6 (six) hours as needed for wheezing or shortness of breath., Disp: 75 mL, Rfl: 1 .  Albuterol Sulfate (PROAIR RESPICLICK) 037 (90 Base) MCG/ACT AEPB, Inhale 2 puffs into the lungs every 4 (four) hours as needed., Disp: 1 each, Rfl: 1 .  calcium  carbonate (OS-CAL - DOSED IN MG OF ELEMENTAL CALCIUM) 1250 (500 Ca) MG tablet, Take 1 tablet by mouth daily with breakfast., Disp: , Rfl:  .  Cholecalciferol (VITAMIN D3) 5000 UNITS TABS, Take 1 tablet by mouth daily., Disp: , Rfl:  .  cycloSPORINE (RESTASIS) 0.05 % ophthalmic emulsion, Place 1 drop into both eyes 2 (two) times daily., Disp: , Rfl:  .  EPINEPHrine 0.3 mg/0.3 mL IJ SOAJ injection, Use as directed for anaphylaxis, Disp: 2 Device, Rfl: 1 .  fluticasone (FLONASE) 50 MCG/ACT nasal spray, 2 sprays per nostril once daily for stuffy nose. (Patient taking differently: Place 1 spray into both nostrils daily. 1 sprays per nostril once daily for stuffy nose.), Disp: 16 g, Rfl: 5 .  fluticasone-salmeterol (ADVAIR HFA) 230-21 MCG/ACT inhaler, Inhale 2 puffs into the lungs 2 (two) times daily., Disp: 1 Inhaler, Rfl: 5 .  hydrochlorothiazide (HYDRODIURIL) 12.5 MG tablet, TAKE 1 TABLET BY MOUTH EVERY DAY (Patient taking differently: Take 12.5 mg by mouth. Monday Wednesday Friday), Disp: 30 tablet, Rfl: 4 .  levocetirizine (XYZAL) 5 MG tablet, TAKE 1 TABLET BY MOUTH EVERY DAY IN THE EVENING, Disp: 30 tablet, Rfl: 0 .  LINZESS 145 MCG CAPS capsule, TAKE ONE CAPSULE BY MOUTH EVERY DAY  ON AN EMPTY STOMACH AT LEAST 30 MINUTES BEFORE 1ST MEAL OF THE DAY, Disp: 90 capsule, Rfl: 1 .  losartan (COZAAR) 25 MG tablet, Take 25 mg by mouth daily. , Disp: , Rfl:  .  Magnesium 250 MG TABS, Take 250 mg by mouth daily. , Disp: , Rfl:  .  MedroxyPROGESTERone Acetate 150 MG/ML SUSY, every 3 (three) months., Disp: , Rfl:  .  metoprolol succinate (TOPROL-XL) 100 MG 24 hr tablet, Take 100 mg by mouth daily. , Disp: , Rfl:  .  montelukast (SINGULAIR) 10 MG tablet, Take 1 tablet by mouth each evening for coughing or wheezing., Disp: 30 tablet, Rfl: 5 .  pantoprazole (PROTONIX) 40 MG tablet, Take 40 mg by mouth daily., Disp: , Rfl:  .  rivaroxaban (XARELTO) 20 MG TABS tablet, Take 20 mg by mouth daily with supper. , Disp: ,  Rfl:    No Known Allergies    No LMP recorded. Patient has had an ablation.. Negative for: breast discharge, breast lump(s), breast pain and breast self exam. Associated symptoms include abnormal vaginal bleeding. Pertinent negatives include abnormal bleeding (hematology), anxiety, decreased libido, depression, difficulty falling sleep, dyspareunia, history of infertility, nocturia, sexual dysfunction, sleep disturbances, urinary incontinence, urinary urgency, vaginal discharge and vaginal itching. Diet regular.The patient states her exercise level is  moderate.   . The patient's tobacco use is:  Social History   Tobacco Use  Smoking Status Never Smoker  Smokeless Tobacco Never Used  . She has been exposed to passive smoke. The patient's alcohol use is:  Social History   Substance and Sexual Activity  Alcohol Use Yes  . Alcohol/week: 1.0 standard drinks  . Types: 1 Glasses of wine per week   Comment: rare use    Review of Systems  Constitutional: Negative.   HENT: Negative.   Eyes: Negative.  Negative for blurred vision.  Respiratory: Negative.  Negative for shortness of breath.   Cardiovascular: Negative.  Negative for chest pain and palpitations.  Gastrointestinal: Negative.   Endocrine: Negative.   Genitourinary: Negative.   Musculoskeletal: Negative.   Skin: Negative.   Allergic/Immunologic: Negative.   Neurological: Negative.   Hematological: Negative.   Psychiatric/Behavioral: Negative.      Today's Vitals   11/16/18 1449  BP: 116/70  Pulse: 80  Temp: 98.6 F (37 C)  TempSrc: Oral  Weight: 282 lb 12.8 oz (128.3 kg)  Height: 5' 6.6" (1.692 m)   Body mass index is 44.83 kg/m.   Objective:  Physical Exam Vitals signs and nursing note reviewed.  Constitutional:      Appearance: Normal appearance. She is obese.  HENT:     Head: Normocephalic and atraumatic.     Right Ear: Tympanic membrane, ear canal and external ear normal.     Left Ear: Tympanic membrane,  ear canal and external ear normal.     Nose: Nose normal.     Mouth/Throat:     Mouth: Mucous membranes are moist.     Pharynx: Oropharynx is clear.  Eyes:     Extraocular Movements: Extraocular movements intact.     Conjunctiva/sclera: Conjunctivae normal.     Pupils: Pupils are equal, round, and reactive to light.  Neck:     Musculoskeletal: Normal range of motion and neck supple.  Cardiovascular:     Rate and Rhythm: Normal rate and regular rhythm.     Pulses: Normal pulses.     Heart sounds: Normal heart sounds.  Pulmonary:     Effort: Pulmonary  effort is normal.     Breath sounds: Normal breath sounds.  Chest:     Breasts:        Right: Normal. No swelling, bleeding, inverted nipple, mass or nipple discharge.        Left: Normal. No swelling, bleeding, inverted nipple, mass or nipple discharge.  Abdominal:     General: Abdomen is flat. Bowel sounds are normal.     Comments: Obese. Difficult to assess organomegaly  Genitourinary:    Comments: deferred Musculoskeletal: Normal range of motion.        General: Swelling present.     Comments: Right knee swelling is present. There is some pain with flexion.   Skin:    General: Skin is warm and dry.     Capillary Refill: Capillary refill takes less than 2 seconds.  Neurological:     General: No focal deficit present.     Mental Status: She is alert and oriented to person, place, and time.  Psychiatric:        Mood and Affect: Mood normal.        Behavior: Behavior normal.         Assessment And Plan:     1. Routine general medical examination at health care facility  A full exam was performed. Importance of monthly self breast exams was discussed with the patient. PATIENT HAS BEEN ADVISED TO GET 30-45 MINUTES REGULAR EXERCISE NO LESS THAN FOUR TO FIVE DAYS PER WEEK - BOTH WEIGHTBEARING EXERCISES AND AEROBIC ARE RECOMMENDED.  SHE IS ADVISED TO FOLLOW A HEALTHY DIET WITH AT LEAST SIX FRUITS/VEGGIES PER DAY, DECREASE INTAKE  OF RED MEAT, AND TO INCREASE FISH INTAKE TO TWO DAYS PER WEEK.  MEATS/FISH SHOULD NOT BE FRIED, BAKED OR BROILED IS PREFERABLE.  I SUGGEST WEARING SPF 50 SUNSCREEN ON EXPOSED PARTS AND ESPECIALLY WHEN IN THE DIRECT SUNLIGHT FOR AN EXTENDED PERIOD OF TIME.  PLEASE AVOID FAST FOOD RESTAURANTS AND INCREASE YOUR WATER INTAKE.  - CMP14+EGFR - CBC - Lipid panel - Hemoglobin A1c  2. Hypertensive heart disease without heart failure  Well controlled. She will continue with current meds. She is encouraged to avoid adding salt to her foods. EKG performed, she is in sinus rhythm. She will rto in six months for re-evaluation.   - EKG 12-Lead - Referral to Nutrition and Diabetes Services - POCT Urinalysis Dipstick (81002) - POCT UA - Microalbumin  3. Paroxysmal atrial fibrillation (HCC)  Chronic, yet stable. She is currently in sinus rhythm.   4. Acute pain of right knee  Possibly due to OA. I will refer her to Ortho as requested.   - Ambulatory referral to Orthopedic Surgery  5. Class 3 severe obesity due to excess calories with serious comorbidity and body mass index (BMI) of 40.0 to 44.9 in adult [facility])  Importance of achieving optimal weight to decrease risk of cardiovascular disease and cancers was discussed with the patient in full detail. She was given tips to incorporate more activity into her daily routine - take stairs when possible, park farther away from her job, grocery stores, etc.     - Referral to Nutrition and Diabetes Services        Maximino Greenland, MD

## 2018-11-22 ENCOUNTER — Other Ambulatory Visit: Payer: Self-pay

## 2018-11-22 MED ORDER — OLOPATADINE HCL 0.7 % OP SOLN: 1.0000 [drp] | Freq: Every day | OPHTHALMIC | 5 refills | Status: DC | PRN

## 2018-11-23 ENCOUNTER — Other Ambulatory Visit: Payer: Self-pay | Admitting: Allergy

## 2018-11-23 ENCOUNTER — Telehealth: Payer: Self-pay | Admitting: Allergy

## 2018-11-23 ENCOUNTER — Ambulatory Visit: Payer: 59 | Admitting: Allergy and Immunology

## 2018-11-23 MED ORDER — EPINASTINE HCL 0.05 % OP SOLN: OPHTHALMIC | 5 refills | Status: DC

## 2018-11-23 NOTE — Telephone Encounter (Signed)
Dr. Mitchel Honour will not cover Pazeo. Will cover Azelastine,Epinastine,Ketotifen Fumarate. Please advise and  How many drops. Thanks

## 2018-11-23 NOTE — Telephone Encounter (Signed)
Done

## 2018-11-23 NOTE — Telephone Encounter (Signed)
epinastine 1 gtt OU bid prn. Thanks.

## 2018-11-25 ENCOUNTER — Encounter: Payer: Self-pay | Admitting: Internal Medicine

## 2018-11-29 ENCOUNTER — Encounter: Payer: Self-pay | Admitting: Skilled Nursing Facility1

## 2018-11-29 ENCOUNTER — Encounter: Payer: 59 | Attending: Internal Medicine | Admitting: Skilled Nursing Facility1

## 2018-11-29 DIAGNOSIS — E669 Obesity, unspecified: Secondary | ICD-10-CM | POA: Insufficient documentation

## 2018-11-29 NOTE — Patient Instructions (Addendum)
-  Limit eating out to 2-3 times a week  -Use [employer] grocery shop for you assistance 2 times a month  -Use your meal ideas sheet to put together balanced meals    -Use your snack ideas sheet   -Eat breakfast, lunch, and dinner

## 2018-11-29 NOTE — Progress Notes (Signed)
  Assessment:  Primary concerns today: hypertension.   Pt states she does have sleep apnea and wears her C-PAP every night. Pt states she takes linzess for her IDS which is helpful with minimal flare ups. Pt state she has a bowel movement one to 2 times a day.  Pt states she feels her sleep could be better. Pt states she does have knee pain. Pt states she does wake in the middle of the night and eat. Pt states she is raising her nephew. Pt states she just wants to learn how to eat properly.  Pts A1C 5.7.   To talk about next time: Eat more vegetables Eating seafood Limiting sugary drinks properly flavoring foods without gravy and butter Reevaluate eating in the middle of the night   MEDICATIONS: see list   DIETARY INTAKE:  Usual eating pattern includes 1-2 meals and 2+ snacks per day.  Everyday foods include none identified.  Avoided foods include none idetnified.    24-hr recall: eating out most meals B ( AM): skipped Snk ( AM):  L ( PM): hamburger, salad or skipped Snk ( PM): chips nuts popcorn D ( PM): salad or tacos or skipped Snk ( PM):  Beverages: water, sweet tea, soda, juice  Usual physical activity: 3-4 days a week 45 minutes on treadmill (for last 2 weeks)  Estimated energy needs: 1600 calories 180 g carbohydrates 120 g protein 44 g fat  Progress Towards Goal(s):  In progress.    Intervention:  Nutrition counseling. Dietitian educated the pt on healthy balanced meals and how to accomplish lifestyle changes for long term success. Goals: -Limit eating out to 2-3 times a week -Use [employer] grocery shop for you assistance 2 times a month -Use your meal ideas sheet to put together balanced meals   -Use your snack ideas sheet  -Eat breakfast, lunch, and dinner  Teaching Method Utilized:  Visual Auditory Hands on  Handouts given during visit include:  Snacks  meals  Barriers to learning/adherence to lifestyle change: none identified   Demonstrated  degree of understanding via:  Teach Back   Monitoring/Evaluation:  Dietary intake, exercise, and body weight prn.

## 2018-12-06 ENCOUNTER — Encounter: Payer: Self-pay | Admitting: Internal Medicine

## 2018-12-06 ENCOUNTER — Ambulatory Visit: Payer: 59 | Admitting: Internal Medicine

## 2018-12-06 VITALS — BP 132/80 | HR 76 | Temp 98.5°F | Ht 67.0 in | Wt 291.8 lb

## 2018-12-06 DIAGNOSIS — Z3042 Encounter for surveillance of injectable contraceptive: Secondary | ICD-10-CM

## 2018-12-06 DIAGNOSIS — R635 Abnormal weight gain: Secondary | ICD-10-CM | POA: Diagnosis not present

## 2018-12-06 DIAGNOSIS — Z308 Encounter for other contraceptive management: Secondary | ICD-10-CM

## 2018-12-06 LAB — POCT URINE PREGNANCY: Preg Test, Ur: NEGATIVE

## 2018-12-06 MED ORDER — MEDROXYPROGESTERONE ACETATE 150 MG/ML IM SUSP
150.0000 mg | Freq: Once | INTRAMUSCULAR | Status: AC
  Administered 2018-12-06: 150 mg via INTRAMUSCULAR

## 2018-12-06 NOTE — Progress Notes (Addendum)
Subjective:     Patient ID: Tracey Escobar , female    DOB: Oct 23, 1970 , 48 y.o.   MRN: [id number]   CC " needs Depo shot"   HPI  Pt is here for Depo shot.  She does not get any period since she had endometrial ablasion. She is taking calcium and Vit D.  He is exercising 45 min at a time 3x/week. Has seen dietician last week.   Past Medical History:  Diagnosis Date  . Arthritis   . Asthma   . Atrial fibrillation (HCC)   . Dysrhythmia    A-Fib  . GERD (gastroesophageal reflux disease)   . Hypertension   . Irritable bowel   . Pneumonia   . Sleep apnea   . Sleep apnea   . Vitamin D deficiency      Family History  Problem Relation Age of Onset  . Hyperlipidemia Mother   . Hypertension Mother   . Hyperlipidemia Father   . Hypertension Father   . COPD Father   . Heart failure Father   . Diabetes Brother   . Hypertension Maternal Grandmother   . Seizures Maternal Grandmother   . Cancer Paternal Grandfather   . Cancer Brother   . Allergic rhinitis Neg Hx   . Angioedema Neg Hx   . Asthma Neg Hx   . Eczema Neg Hx   . Immunodeficiency Neg Hx   . Urticaria Neg Hx      Current Outpatient Medications:  .  albuterol (PROVENTIL) (2.5 MG/3ML) 0.083% nebulizer solution, Take 3 mLs (2.5 mg total) by nebulization every 6 (six) hours as needed for wheezing or shortness of breath., Disp: 75 mL, Rfl: 1 .  Albuterol Sulfate (PROAIR RESPICLICK) 108 (90 Base) MCG/ACT AEPB, Inhale 2 puffs into the lungs every 4 (four) hours as needed., Disp: 1 each, Rfl: 1 .  calcium carbonate (OS-CAL - DOSED IN MG OF ELEMENTAL CALCIUM) 1250 (500 Ca) MG tablet, Take 1 tablet by mouth daily with breakfast., Disp: , Rfl:  .  Cholecalciferol (VITAMIN D3) 5000 UNITS TABS, Take 1 tablet by mouth daily., Disp: , Rfl:  .  cycloSPORINE (RESTASIS) 0.05 % ophthalmic emulsion, Place 1 drop into both eyes 2 (two) times daily., Disp: , Rfl:  .  Epinastine HCl 0.05 % ophthalmic solution, One drop each eye  twice  daily as needed, Disp: 10 mL, Rfl: 5 .  EPINEPHrine 0.3 mg/0.3 mL IJ SOAJ injection, Use as directed for anaphylaxis, Disp: 2 Device, Rfl: 1 .  fluticasone (FLONASE) 50 MCG/ACT nasal spray, 2 sprays per nostril once daily for stuffy nose. (Patient taking differently: Place 1 spray into both nostrils daily. 1 sprays per nostril once daily for stuffy nose.), Disp: 16 g, Rfl: 5 .  fluticasone-salmeterol (ADVAIR HFA) 230-21 MCG/ACT inhaler, Inhale 2 puffs into the lungs 2 (two) times daily., Disp: 1 Inhaler, Rfl: 5 .  hydrochlorothiazide (HYDRODIURIL) 12.5 MG tablet, TAKE 1 TABLET BY MOUTH EVERY DAY (Patient taking differently: Take 12.5 mg by mouth. Monday Wednesday Friday), Disp: 30 tablet, Rfl: 4 .  levocetirizine (XYZAL) 5 MG tablet, TAKE 1 TABLET BY MOUTH EVERY DAY IN THE EVENING, Disp: 30 tablet, Rfl: 0 .  LINZESS 145 MCG CAPS capsule, TAKE ONE CAPSULE BY MOUTH EVERY DAY ON AN EMPTY STOMACH AT LEAST 30 MINUTES BEFORE 1ST MEAL OF THE DAY, Disp: 90 capsule, Rfl: 1 .  losartan (COZAAR) 25 MG tablet, Take 25 mg by mouth daily. , Disp: , Rfl:  .  Magnesium 250  MG TABS, Take 250 mg by mouth daily. , Disp: , Rfl:  .  MedroxyPROGESTERone Acetate 150 MG/ML SUSY, every 3 (three) months., Disp: , Rfl:  .  metoprolol succinate (TOPROL-XL) 100 MG 24 hr tablet, Take 100 mg by mouth daily. , Disp: , Rfl:  .  montelukast (SINGULAIR) 10 MG tablet, Take 1 tablet by mouth each evening for coughing or wheezing., Disp: 30 tablet, Rfl: 5 .  Olopatadine HCl (PAZEO) 0.7 % SOLN, Place 1 drop into both eyes daily as needed., Disp: 1 Bottle, Rfl: 5 .  pantoprazole (PROTONIX) 40 MG tablet, Take 40 mg by mouth daily., Disp: , Rfl:  .  rivaroxaban (XARELTO) 20 MG TABS tablet, Take 20 mg by mouth daily with supper. , Disp: , Rfl:    No Known Allergies   Review of Systems  Constitutional: Positive for unexpected weight change. Negative for chills, diaphoresis and fatigue.  Respiratory: Negative for chest tightness and  shortness of breath.   Cardiovascular: Positive for leg swelling. Negative for chest pain and palpitations.       Gets lower leg and ankle swelling from venous insuficiency     Today's Vitals   12/06/18 1550  BP: 132/80  Pulse: 76  Temp: 98.5 F (36.9 C)  TempSrc: Oral  Weight: 291 lb 12.8 oz (132.4 kg)  Height: 5\' 7"  (1.702 m)   Body mass index is 45.7 kg/m.   Objective:  Physical Exam Vitals signs and nursing note reviewed.  Constitutional:      General: She is not in acute distress.    Appearance: She is obese. She is not toxic-appearing.  HENT:     Right Ear: External ear normal.     Left Ear: External ear normal.  Eyes:     General: No scleral icterus.    Conjunctiva/sclera: Conjunctivae normal.  Neck:     Musculoskeletal: Neck supple.  Cardiovascular:     Rate and Rhythm: Normal rate and regular rhythm.     Heart sounds: No murmur.  Pulmonary:     Effort: Pulmonary effort is normal.  Musculoskeletal: Normal range of motion.     Right lower leg: No edema.     Left lower leg: No edema.  Skin:    General: Skin is warm and dry.     Findings: No rash.  Neurological:     Mental Status: She is alert and oriented to person, place, and time.     Gait: Gait normal.  Psychiatric:        Mood and Affect: Mood normal.        Behavior: Behavior normal.        Thought Content: Thought content normal.        Judgment: Judgment normal.    Has gained 4 lbs.  Pregnancy test is neg.  Assessment And Plan:     1. Encounter for other contraceptive management- chronic.  - POCT Urine Pregnancy- neg  Depo Shot given. Fu in 12 weeks.  2- Weight gain- she will continue exercising and following noutrional counseling's advise.          Sameer Teeple RODRIGUEZ-SOUTHWORTH, PA-C

## 2018-12-07 ENCOUNTER — Ambulatory Visit (INDEPENDENT_AMBULATORY_CARE_PROVIDER_SITE_OTHER): Payer: 59

## 2018-12-07 ENCOUNTER — Ambulatory Visit: Payer: Self-pay | Admitting: Internal Medicine

## 2018-12-07 DIAGNOSIS — J309 Allergic rhinitis, unspecified: Secondary | ICD-10-CM | POA: Diagnosis not present

## 2018-12-22 ENCOUNTER — Ambulatory Visit (INDEPENDENT_AMBULATORY_CARE_PROVIDER_SITE_OTHER): Payer: 59

## 2018-12-22 DIAGNOSIS — J309 Allergic rhinitis, unspecified: Secondary | ICD-10-CM

## 2018-12-23 ENCOUNTER — Telehealth: Payer: Self-pay

## 2018-12-23 MED ORDER — ALCAFTADINE 0.25 % OP SOLN: OPHTHALMIC | 5 refills | Status: DC

## 2018-12-23 MED ORDER — OLOPATADINE HCL 0.7 % OP SOLN: 1.0000 [drp] | Freq: Every day | OPHTHALMIC | 5 refills | Status: DC | PRN

## 2018-12-23 NOTE — Telephone Encounter (Signed)
Per Dr. Selena Batten ok'd changing pazeo to lastacaft. The pazeo was not going through on her insurance. lastacaft works well for the pt.

## 2018-12-23 NOTE — Telephone Encounter (Signed)
Pt. Called her insurance company [facility] and they stated they cover 3 eye drops Lastacraft, azelastine and pazeo. I sent in pazeo that was in her medications. It seems pt. Has been having a go around with her insurance which eye drop they will cover.

## 2018-12-29 ENCOUNTER — Ambulatory Visit: Payer: 59 | Admitting: Skilled Nursing Facility1

## 2019-01-02 ENCOUNTER — Other Ambulatory Visit: Payer: Self-pay | Admitting: Internal Medicine

## 2019-01-06 ENCOUNTER — Ambulatory Visit (INDEPENDENT_AMBULATORY_CARE_PROVIDER_SITE_OTHER): Payer: 59

## 2019-01-06 ENCOUNTER — Ambulatory Visit: Payer: Self-pay | Admitting: Adult Health

## 2019-01-06 DIAGNOSIS — J309 Allergic rhinitis, unspecified: Secondary | ICD-10-CM

## 2019-01-11 ENCOUNTER — Ambulatory Visit (INDEPENDENT_AMBULATORY_CARE_PROVIDER_SITE_OTHER): Payer: 59 | Admitting: *Deleted

## 2019-01-11 DIAGNOSIS — J309 Allergic rhinitis, unspecified: Secondary | ICD-10-CM | POA: Diagnosis not present

## 2019-02-01 ENCOUNTER — Ambulatory Visit: Payer: 59 | Admitting: Skilled Nursing Facility1

## 2019-02-06 ENCOUNTER — Telehealth: Payer: Self-pay | Admitting: Allergy and Immunology

## 2019-02-06 NOTE — Telephone Encounter (Signed)
Please provide a prescription for azelastine nasal spray, 1-2 sprays per nostril 2 times daily as needed.  Continue fluticasone nasal spray. If she is unable to use NeilMed sinus rinse, she should at least use nasal saline spray (i.e. Simply Saline) is recommended prior to medicated nasal sprays and as needed.  (I.e. simply saline or Armen Hammer) prior to the azelastine which should be used prior to fluticasone nasal spray.  She can use this regimen twice daily. For thick post nasal drainage, nasal congestion, and/or sinus pressure, add guaifenesin [id number] mg (Mucinex) plus/minus pseudoephedrine 60-120 mg  twice daily as needed with adequate hydration.  Pseudoephedrine is only to be used for short-term relief of nasal/sinus congestion. Long-term use is discouraged due to potential side effects.

## 2019-02-06 NOTE — Telephone Encounter (Signed)
Pt is using fluticasone, levocetirizine, montelukast. She is not doing neil med sinus rinses because she stated it was too uncomfortable and not using mucinex. She is not having a cough or colored mucus production- she is just stopped up with a lot of pressure and having sinus headaches. Please advise.

## 2019-02-06 NOTE — Telephone Encounter (Signed)
lvm to return call.

## 2019-02-06 NOTE — Telephone Encounter (Signed)
PT called in to see if we can give her meds for sinus congestion, ear pain, headache, stuffy nose. Started this past week but got worse over the weekend.

## 2019-02-07 NOTE — Telephone Encounter (Signed)
Pt informed of instructions.

## 2019-02-08 DIAGNOSIS — M170 Bilateral primary osteoarthritis of knee: Secondary | ICD-10-CM | POA: Diagnosis not present

## 2019-02-08 DIAGNOSIS — E669 Obesity, unspecified: Secondary | ICD-10-CM | POA: Diagnosis not present

## 2019-02-13 ENCOUNTER — Other Ambulatory Visit: Payer: Self-pay | Admitting: *Deleted

## 2019-02-13 MED ORDER — FLUTICASONE PROPIONATE 50 MCG/ACT NA SUSP: NASAL | 0 refills | Status: DC

## 2019-02-15 DIAGNOSIS — E669 Obesity, unspecified: Secondary | ICD-10-CM | POA: Diagnosis not present

## 2019-02-15 DIAGNOSIS — M170 Bilateral primary osteoarthritis of knee: Secondary | ICD-10-CM | POA: Diagnosis not present

## 2019-02-20 DIAGNOSIS — E669 Obesity, unspecified: Secondary | ICD-10-CM | POA: Diagnosis not present

## 2019-02-20 DIAGNOSIS — M170 Bilateral primary osteoarthritis of knee: Secondary | ICD-10-CM | POA: Diagnosis not present

## 2019-02-24 DIAGNOSIS — G4733 Obstructive sleep apnea (adult) (pediatric): Secondary | ICD-10-CM | POA: Diagnosis not present

## 2019-02-27 ENCOUNTER — Other Ambulatory Visit: Payer: Self-pay | Admitting: Internal Medicine

## 2019-03-03 ENCOUNTER — Other Ambulatory Visit: Payer: Self-pay | Admitting: Internal Medicine

## 2019-03-06 ENCOUNTER — Other Ambulatory Visit: Payer: Self-pay | Admitting: *Deleted

## 2019-03-06 MED ORDER — AZELASTINE HCL 0.1 % NA SOLN: 1.0000 | Freq: Two times a day (BID) | NASAL | 0 refills | Status: DC

## 2019-03-09 ENCOUNTER — Other Ambulatory Visit: Payer: Self-pay

## 2019-03-09 ENCOUNTER — Encounter: Payer: Self-pay | Admitting: Internal Medicine

## 2019-03-09 ENCOUNTER — Ambulatory Visit: Payer: 59 | Admitting: Internal Medicine

## 2019-03-09 VITALS — BP 138/70 | HR 81 | Temp 98.9°F | Ht 66.8 in | Wt 288.4 lb

## 2019-03-09 DIAGNOSIS — Z3202 Encounter for pregnancy test, result negative: Secondary | ICD-10-CM

## 2019-03-09 DIAGNOSIS — Z30013 Encounter for initial prescription of injectable contraceptive: Secondary | ICD-10-CM | POA: Diagnosis not present

## 2019-03-09 DIAGNOSIS — R7309 Other abnormal glucose: Secondary | ICD-10-CM

## 2019-03-09 DIAGNOSIS — I119 Hypertensive heart disease without heart failure: Secondary | ICD-10-CM | POA: Diagnosis not present

## 2019-03-09 DIAGNOSIS — Z6841 Body Mass Index (BMI) 40.0 and over, adult: Secondary | ICD-10-CM

## 2019-03-09 DIAGNOSIS — K5909 Other constipation: Secondary | ICD-10-CM

## 2019-03-09 DIAGNOSIS — Z308 Encounter for other contraceptive management: Secondary | ICD-10-CM

## 2019-03-09 MED ORDER — MEDROXYPROGESTERONE ACETATE 150 MG/ML IM SUSP
150.0000 mg | Freq: Once | INTRAMUSCULAR | Status: AC
  Administered 2019-03-09: 17:00:00 150 mg via INTRAMUSCULAR

## 2019-03-09 MED ORDER — LINACLOTIDE 72 MCG PO CAPS: ORAL_CAPSULE | ORAL | 0 refills | Status: DC

## 2019-03-09 NOTE — Progress Notes (Signed)
Subjective:     Patient ID: Tracey Escobar , female    DOB: [birth year] , 48 y.o.   MRN: [id number]   Chief Complaint  Patient presents with  . Contraception    HPI Here  For Depo shot, and does not have have periods since ablassion in January.  She has one more week of phentermine and this is her last month she can be on. Has been dealing with constipation and the Linzess 145 mg is not helping. She has tried the 290 mg and caused her diarrhea. Would like to try samples of  In between dose she has tried. Denies dry mouth or insomnia.    Past Medical History:  Diagnosis Date  . Arthritis   . Asthma   . Atrial fibrillation (HCC)   . Dysrhythmia    A-Fib  . GERD (gastroesophageal reflux disease)   . Hypertension   . Irritable bowel   . Pneumonia   . Sleep apnea   . Sleep apnea   . Vitamin D deficiency      Family History  Problem Relation Age of Onset  . Hyperlipidemia Mother   . Hypertension Mother   . Hyperlipidemia Father   . Hypertension Father   . COPD Father   . Heart failure Father   . Diabetes Brother   . Hypertension Maternal Grandmother   . Seizures Maternal Grandmother   . Cancer Paternal Grandfather   . Cancer Brother   . Allergic rhinitis Neg Hx   . Angioedema Neg Hx   . Asthma Neg Hx   . Eczema Neg Hx   . Immunodeficiency Neg Hx   . Urticaria Neg Hx      Current Outpatient Medications:  .  albuterol (PROVENTIL) (2.5 MG/3ML) 0.083% nebulizer solution, Take 3 mLs (2.5 mg total) by nebulization every 6 (six) hours as needed for wheezing or shortness of breath., Disp: 75 mL, Rfl: 1 .  Albuterol Sulfate (PROAIR RESPICLICK) 108 (90 Base) MCG/ACT AEPB, Inhale 2 puffs into the lungs every 4 (four) hours as needed., Disp: 1 each, Rfl: 1 .  Alcaftadine (LASTACAFT) 0.25 % SOLN, Instill 1 drop into both eyes once daily for itchy eyes., Disp: 1 Bottle, Rfl: 5 .  azelastine (ASTELIN) 0.1 % nasal spray, Place 1 spray into both nostrils 2 (two) times daily., Disp:  30 mL, Rfl: 0 .  calcium carbonate (OS-CAL - DOSED IN MG OF ELEMENTAL CALCIUM) 1250 (500 Ca) MG tablet, Take 1 tablet by mouth daily with breakfast., Disp: , Rfl:  .  Cholecalciferol (VITAMIN D3) 5000 UNITS TABS, Take 1 tablet by mouth daily., Disp: , Rfl:  .  cycloSPORINE (RESTASIS) 0.05 % ophthalmic emulsion, Place 1 drop into both eyes 2 (two) times daily., Disp: , Rfl:  .  Epinastine HCl 0.05 % ophthalmic solution, One drop each eye  twice daily as needed, Disp: 10 mL, Rfl: 5 .  EPINEPHrine 0.3 mg/0.3 mL IJ SOAJ injection, Use as directed for anaphylaxis, Disp: 2 Device, Rfl: 1 .  fluticasone (FLONASE) 50 MCG/ACT nasal spray, 2 sprays per nostril once daily for stuffy nose., Disp: 16 g, Rfl: 0 .  fluticasone-salmeterol (ADVAIR HFA) 230-21 MCG/ACT inhaler, Inhale 2 puffs into the lungs 2 (two) times daily., Disp: 1 Inhaler, Rfl: 5 .  hydrochlorothiazide (HYDRODIURIL) 12.5 MG tablet, TAKE 1 TABLET BY MOUTH EVERY DAY (Patient taking differently: Take 12.5 mg by mouth. Monday Wednesday Friday), Disp: 30 tablet, Rfl: 4 .  levocetirizine (XYZAL) 5 MG tablet, TAKE 1 TABLET BY  MOUTH EVERY DAY IN THE EVENING, Disp: 90 tablet, Rfl: 1 .  LINZESS 145 MCG CAPS capsule, TAKE 1 CAPSULE BY MOUTH EVERY DAY ON AN EMPTY STOMACH AT LEAST 30 MINUTES BEFORE FIRST MEAL OF THE DAY, Disp: 90 capsule, Rfl: 1 .  losartan (COZAAR) 25 MG tablet, Take 25 mg by mouth daily. , Disp: , Rfl:  .  Magnesium 250 MG TABS, Take 250 mg by mouth daily. , Disp: , Rfl:  .  medroxyPROGESTERone (DEPO-PROVERA) 150 MG/ML injection, Inject 150 mg into the muscle every 3 (three) months., Disp: , Rfl:  .  MedroxyPROGESTERone Acetate 150 MG/ML SUSY, every 3 (three) months., Disp: , Rfl:  .  metoprolol succinate (TOPROL-XL) 100 MG 24 hr tablet, Take 100 mg by mouth daily. , Disp: , Rfl:  .  montelukast (SINGULAIR) 10 MG tablet, Take 1 tablet by mouth each evening for coughing or wheezing., Disp: 30 tablet, Rfl: 5 .  Olopatadine HCl (PAZEO) 0.7 %  SOLN, Place 1 drop into both eyes daily as needed., Disp: 1 Bottle, Rfl: 5 .  pantoprazole (PROTONIX) 40 MG tablet, Take 40 mg by mouth daily., Disp: , Rfl:  .  rivaroxaban (XARELTO) 20 MG TABS tablet, Take 20 mg by mouth daily with supper. , Disp: , Rfl:    No Known Allergies   Review of Systems  Has chronic R knee and awaiting for MRI auth. Denies SOB, CP, urinary difficulty, insomnia, or abdominal pian,  does have chronic constipation. Denies polyuria or polydipsia.   Today's Vitals   03/09/19 1607  BP: 138/70  Pulse: 81  Temp: 98.9 F (37.2 C)  TempSrc: Oral  SpO2: 99%  Weight: 288 lb 6.4 oz (130.8 kg)  Height: 5' 6.8" (1.697 m)   Body mass index is 45.44 kg/m.   Objective:  Physical Exam   Constitutional: She is oriented to person, place, and time. She appears well-developed and well-nourished. No distress.  HENT:  Head: Normocephalic and atraumatic.  Right Ear: External ear normal.  Left Ear: External ear normal.  Nose: Nose normal.  Eyes: Conjunctivae are normal. Right eye exhibits no discharge. Left eye exhibits no discharge. No scleral icterus.  Neck: Neck supple. No thyromegaly present.  No carotid bruits bilaterally  Cardiovascular: Normal rate and regular rhythm.  No murmur heard. Pulmonary/Chest: Effort normal and breath sounds normal. No respiratory distress.  Musculoskeletal: Normal range of motion. She exhibits no edema.  Lymphadenopathy:    She has no cervical adenopathy.  Neurological: She is alert and oriented to person, place, and time.  Skin: Skin is warm and dry. Capillary refill takes less than 2 seconds. No rash noted. She is not diaphoretic.  Psychiatric: She has a normal mood and affect. Her behavior is normal. Judgment and thought content normal.  Nursing note reviewed.    Assessment And Plan:  1. Encounter for other contraceptive management - medroxyPROGESTERone (DEPO-PROVERA) injection 150 mg - POCT Urine Pregnancy- neg  2. Class 3  severe obesity due to excess calories with serious comorbidity and body mass index (BMI) of 40.0 to 44.9 in adult (HCC)  wt is down 3 lbs since last visit. She will check with her insurance which long tern wt loss meds her insurance will cover and call us with the name for us to send for her.   3. Hypertensive heart disease without heart failure- stable. May continue same meds.  - CBC no Diff - CMP14 + Anion Gap - Lipid Profile  4. Abnormal glucose chronic - Hemoglobin  A1c  5. Chronic constipation    I added Linzess 72 mg to the 145 mg one ( samples given) and see if this helps better. It may just be the Phentermine making her more constipated.  Nyoka Alcoser RODRIGUEZ-SOUTHWORTH, PA-C    THE PATIENT IS ENCOURAGED TO PRACTICE SOCIAL DISTANCING DUE TO THE COVID-19 PANDEMIC.

## 2019-03-10 ENCOUNTER — Other Ambulatory Visit: Payer: Self-pay | Admitting: Internal Medicine

## 2019-03-10 DIAGNOSIS — E876 Hypokalemia: Secondary | ICD-10-CM

## 2019-03-10 LAB — LIPID PANEL
Chol/HDL Ratio: 2.3 ratio (ref 0.0–4.4)
Cholesterol, Total: 129 mg/dL (ref 100–199)
HDL: 56 mg/dL (ref >39)
LDL Calculated: 60 mg/dL (ref 0–99)
Triglycerides: 67 mg/dL (ref 0–149)
VLDL Cholesterol Cal: 13 mg/dL (ref 5–40)

## 2019-03-10 LAB — CMP14 + ANION GAP
ALT: 13 IU/L (ref 0–32)
AST: 14 IU/L (ref 0–40)
Albumin/Globulin Ratio: 1.4 (ref 1.2–2.2)
Albumin: 4.1 g/dL (ref 3.8–4.8)
Alkaline Phosphatase: 87 IU/L (ref 39–117)
Anion Gap: 18 mmol/L (ref 10.0–18.0)
BUN/Creatinine Ratio: 14 (ref 9–23)
BUN: 14 mg/dL (ref 6–24)
Bilirubin Total: 0.3 mg/dL (ref 0.0–1.2)
CO2: 23 mmol/L (ref 20–29)
Calcium: 10.5 mg/dL — ABNORMAL HIGH (ref 8.7–10.2)
Chloride: 97 mmol/L (ref 96–106)
Creatinine, Ser: 1.01 mg/dL — ABNORMAL HIGH (ref 0.57–1.00)
GFR calc Af Amer: 76 mL/min/{1.73_m2} (ref >59)
GFR calc non Af Amer: 66 mL/min/{1.73_m2} (ref >59)
Globulin, Total: 3 g/dL (ref 1.5–4.5)
Glucose: 118 mg/dL — ABNORMAL HIGH (ref 65–99)
Potassium: 2.9 mmol/L — ABNORMAL LOW (ref 3.5–5.2)
Sodium: 138 mmol/L (ref 134–144)
Total Protein: 7.1 g/dL (ref 6.0–8.5)

## 2019-03-10 LAB — CBC
Hematocrit: 39.6 % (ref 34.0–46.6)
Hemoglobin: 13.1 g/dL (ref 11.1–15.9)
MCH: 28.5 pg (ref 26.6–33.0)
MCHC: 33.1 g/dL (ref 31.5–35.7)
MCV: 86 fL (ref 79–97)
Platelets: 362 10*3/uL (ref 150–450)
RBC: 4.59 x10E6/uL (ref 3.77–5.28)
RDW: 13 % (ref 11.7–15.4)
WBC: 7.3 10*3/uL (ref 3.4–10.8)

## 2019-03-10 LAB — HEMOGLOBIN A1C
Est. average glucose Bld gHb Est-mCnc: 117 mg/dL
Hgb A1c MFr Bld: 5.7 % — ABNORMAL HIGH (ref 4.8–5.6)

## 2019-03-13 LAB — POCT URINE PREGNANCY: Preg Test, Ur: NEGATIVE

## 2019-03-14 ENCOUNTER — Other Ambulatory Visit: Payer: Self-pay | Admitting: Family Medicine

## 2019-03-14 DIAGNOSIS — M25561 Pain in right knee: Secondary | ICD-10-CM

## 2019-03-16 ENCOUNTER — Other Ambulatory Visit: Payer: Self-pay

## 2019-03-16 DIAGNOSIS — E876 Hypokalemia: Secondary | ICD-10-CM

## 2019-03-17 ENCOUNTER — Other Ambulatory Visit: Payer: Self-pay | Admitting: Internal Medicine

## 2019-03-17 LAB — BMP8+ANION GAP
Anion Gap: 17 mmol/L (ref 10.0–18.0)
BUN/Creatinine Ratio: 10 (ref 9–23)
BUN: 8 mg/dL (ref 6–24)
CO2: 20 mmol/L (ref 20–29)
Calcium: 10.5 mg/dL — ABNORMAL HIGH (ref 8.7–10.2)
Chloride: 107 mmol/L — ABNORMAL HIGH (ref 96–106)
Creatinine, Ser: 0.78 mg/dL (ref 0.57–1.00)
GFR calc Af Amer: 104 mL/min/{1.73_m2} (ref >59)
GFR calc non Af Amer: 90 mL/min/{1.73_m2} (ref >59)
Glucose: 103 mg/dL — ABNORMAL HIGH (ref 65–99)
Potassium: 3.6 mmol/L (ref 3.5–5.2)
Sodium: 144 mmol/L (ref 134–144)

## 2019-03-17 LAB — PTH, INTACT AND CALCIUM
Calcium: 10.4 mg/dL — ABNORMAL HIGH (ref 8.7–10.2)
PTH: 27 pg/mL (ref 15–65)

## 2019-03-17 MED ORDER — LINACLOTIDE 72 MCG PO CAPS: ORAL_CAPSULE | ORAL | 0 refills | Status: DC

## 2019-03-22 ENCOUNTER — Encounter: Payer: Self-pay | Admitting: Internal Medicine

## 2019-04-04 ENCOUNTER — Other Ambulatory Visit: Payer: Self-pay

## 2019-04-04 ENCOUNTER — Ambulatory Visit
Admission: RE | Admit: 2019-04-04 | Discharge: 2019-04-04 | Disposition: A | Discharge status: Home / Self Care | Payer: 59 | Source: Ambulatory Visit | Attending: Family Medicine | Admitting: Family Medicine

## 2019-04-04 DIAGNOSIS — M25561 Pain in right knee: Secondary | ICD-10-CM

## 2019-04-18 ENCOUNTER — Encounter: Payer: Self-pay | Admitting: Internal Medicine

## 2019-05-08 ENCOUNTER — Other Ambulatory Visit: Payer: Self-pay | Admitting: *Deleted

## 2019-05-08 MED ORDER — PANTOPRAZOLE SODIUM 40 MG PO TBEC: 40.0000 mg | DELAYED_RELEASE_TABLET | Freq: Every day | ORAL | 0 refills | Status: DC

## 2019-05-08 NOTE — Telephone Encounter (Signed)
Courtesy refill of pantoprazole. Pt needs ov.

## 2019-05-17 ENCOUNTER — Ambulatory Visit: Payer: 59 | Admitting: Internal Medicine

## 2019-05-24 ENCOUNTER — Encounter: Payer: Self-pay | Admitting: Internal Medicine

## 2019-05-24 ENCOUNTER — Other Ambulatory Visit: Payer: Self-pay

## 2019-05-24 ENCOUNTER — Ambulatory Visit: Payer: 59 | Admitting: Internal Medicine

## 2019-05-24 VITALS — BP 120/76 | HR 75 | Temp 98.6°F | Ht 66.8 in | Wt 295.2 lb

## 2019-05-24 DIAGNOSIS — Z3042 Encounter for surveillance of injectable contraceptive: Secondary | ICD-10-CM | POA: Diagnosis not present

## 2019-05-24 DIAGNOSIS — I119 Hypertensive heart disease without heart failure: Secondary | ICD-10-CM | POA: Diagnosis not present

## 2019-05-24 DIAGNOSIS — Z01818 Encounter for other preprocedural examination: Secondary | ICD-10-CM | POA: Diagnosis not present

## 2019-05-24 DIAGNOSIS — Z6841 Body Mass Index (BMI) 40.0 and over, adult: Secondary | ICD-10-CM

## 2019-05-24 DIAGNOSIS — M25569 Pain in unspecified knee: Secondary | ICD-10-CM | POA: Diagnosis not present

## 2019-05-24 DIAGNOSIS — E66813 Obesity, class 3: Secondary | ICD-10-CM

## 2019-05-24 LAB — POCT URINE PREGNANCY: Preg Test, Ur: NEGATIVE

## 2019-05-24 MED ORDER — MEDROXYPROGESTERONE ACETATE 150 MG/ML IM SUSP
150.0000 mg | Freq: Once | INTRAMUSCULAR | Status: AC
  Administered 2019-05-24: 150 mg via INTRAMUSCULAR

## 2019-05-24 NOTE — Patient Instructions (Signed)
Hormonal Contraception Information Hormonal contraception is a type of birth control that uses hormones to prevent pregnancy. It usually involves a combination of the hormones estrogen and progesterone or only the hormone progesterone. Hormonal contraception works in these ways:  It thickens the mucus in the cervix, making it harder for sperm to enter the uterus.  It changes the lining of the uterus, making it harder for an egg to implant.  It may stop the ovaries from releasing eggs (ovulation). Some women who take hormonal contraceptives that contain only progesterone may continue to ovulate. Hormonal contraception cannot prevent sexually transmitted infections (STIs). Pregnancy may still occur. Estrogen and progesterone contraceptives Contraceptives that use a combination of estrogen and progesterone are available in these forms:  Pill. Pills come in different combinations of hormones. They must be taken at the same time each day. Pills can affect your period, causing you to get your period once every three months or not at all.  Patch. The patch must be worn on the lower abdomen for three weeks and then removed on the fourth.  Vaginal ring. The ring is placed in the vagina and left there for three weeks. It is then removed for one week. Progesterone contraceptives Contraceptives that use progesterone only are available in these forms:  Pill. Pills should be taken every day of the cycle.  Intrauterine device (IUD). This device is inserted into the uterus and removed or replaced every five years or sooner.  Implant. Plastic rods are placed under the skin of the upper arm. They are removed or replaced every three years or sooner.  Injection. The injection is given once every 90 days. What are the side effects? The side effects of estrogen and progesterone contraceptives include:  Nausea.  Headaches.  Breast tenderness.  Bleeding or spotting between menstrual cycles.  High blood  pressure (rare).  Strokes, heart attacks, or blood clots (rare) Side effects of progesterone-only contraceptives include:  Nausea.  Headaches.  Breast tenderness.  Unpredictable menstrual bleeding.  High blood pressure (rare). Talk to your health care provider about what side effects may affect you. Where to find more information  Ask your health care provider for more information and resources about hormonal contraception.  [place] Department of Health and Human Services Office on Women's Health: www.womenshealth.gov Questions to ask:  What type of hormonal contraception is right for me?  How long should I plan to use hormonal contraception?  What are the side effects of the hormonal contraception method I choose?  How can I prevent STIs while using hormonal contraception? Contact a health care provider if:  You start taking hormonal contraceptives and you develop persistent or severe side effects. Summary  Estrogen and progesterone are hormones used in many forms of birth control.  Talk to your health care provider about what side effects may affect you.  Hormonal contraception cannot prevent sexually transmitted infections (STIs).  Ask your health care provider for more information and resources about hormonal contraception. This information is not intended to replace advice given to you by your health care provider. Make sure you discuss any questions you have with your health care provider. Document Released: 10/11/2007 Document Revised: 01/16/2019 Document Reviewed: 08/21/2016 Elsevier Patient Education  2020 [employer]  

## 2019-06-05 ENCOUNTER — Other Ambulatory Visit: Payer: Self-pay

## 2019-06-07 ENCOUNTER — Telehealth: Payer: Self-pay | Admitting: *Deleted

## 2019-06-07 MED ORDER — AZELASTINE HCL 0.1 % NA SOLN: 1.0000 | Freq: Two times a day (BID) | NASAL | 0 refills | Status: DC

## 2019-06-07 MED ORDER — FLUTICASONE PROPIONATE 50 MCG/ACT NA SUSP: NASAL | 0 refills | Status: DC

## 2019-06-07 NOTE — Telephone Encounter (Signed)
Pt scheduled apt 06/21/19 and gave refills for Flonase and azelastine.

## 2019-06-09 ENCOUNTER — Encounter: Payer: Self-pay | Admitting: Internal Medicine

## 2019-06-10 NOTE — Progress Notes (Addendum)
Subjective:     Patient ID: Tracey Escobar , female    DOB: 08/27/1971 , 48 y.o.   MRN: [id number]   Chief Complaint  Patient presents with  . Hypertension  . Contraception  . Surgical clearance    HPI  She is here today for bp check. She reports compliance with meds. Also wants to get depo shot. She no longer receives this at GYN office.  Additionally, she wants clearance to have knee surgery.   Hypertension This is a chronic problem. The current episode started more than 1 year ago. The problem has been gradually improving since onset. The problem is controlled. Pertinent negatives include no blurred vision, chest pain, palpitations or shortness of breath. Risk factors for coronary artery disease include obesity. Past treatments include lifestyle changes, angiotensin blockers and diuretics. The current treatment provides moderate improvement.     Past Medical History:  Diagnosis Date  . Arthritis   . Asthma   . Atrial fibrillation (HCC)   . Dysrhythmia    A-Fib  . GERD (gastroesophageal reflux disease)   . Hypertension   . Irritable bowel   . Pneumonia   . Sleep apnea   . Sleep apnea   . Vitamin D deficiency      Family History  Problem Relation Age of Onset  . Hyperlipidemia Mother   . Hypertension Mother   . Hyperlipidemia Father   . Hypertension Father   . COPD Father   . Heart failure Father   . Diabetes Brother   . Hypertension Maternal Grandmother   . Seizures Maternal Grandmother   . Cancer Paternal Grandfather   . Cancer Brother   . Allergic rhinitis Neg Hx   . Angioedema Neg Hx   . Asthma Neg Hx   . Eczema Neg Hx   . Immunodeficiency Neg Hx   . Urticaria Neg Hx      Current Outpatient Medications:  .  albuterol (PROVENTIL) (2.5 MG/3ML) 0.083% nebulizer solution, Take 3 mLs (2.5 mg total) by nebulization every 6 (six) hours as needed for wheezing or shortness of breath., Disp: 75 mL, Rfl: 1 .  Albuterol Sulfate (PROAIR RESPICLICK) 108 (90 Base)  MCG/ACT AEPB, Inhale 2 puffs into the lungs every 4 (four) hours as needed., Disp: 1 each, Rfl: 1 .  Alcaftadine (LASTACAFT) 0.25 % SOLN, Instill 1 drop into both eyes once daily for itchy eyes., Disp: 1 Bottle, Rfl: 5 .  azelastine (ASTELIN) 0.1 % nasal spray, Place 1 spray into both nostrils 2 (two) times daily., Disp: 30 mL, Rfl: 0 .  calcium carbonate (OS-CAL - DOSED IN MG OF ELEMENTAL CALCIUM) 1250 (500 Ca) MG tablet, Take 1 tablet by mouth daily with breakfast., Disp: , Rfl:  .  Cholecalciferol (VITAMIN D3) 5000 UNITS TABS, Take 1 tablet by mouth daily., Disp: , Rfl:  .  cycloSPORINE (RESTASIS) 0.05 % ophthalmic emulsion, Place 1 drop into both eyes 2 (two) times daily., Disp: , Rfl:  .  Epinastine HCl 0.05 % ophthalmic solution, One drop each eye  twice daily as needed, Disp: 10 mL, Rfl: 5 .  EPINEPHrine 0.3 mg/0.3 mL IJ SOAJ injection, Use as directed for anaphylaxis, Disp: 2 Device, Rfl: 1 .  fluticasone (FLONASE) 50 MCG/ACT nasal spray, 2 sprays per nostril once daily for stuffy nose., Disp: 16 g, Rfl: 0 .  fluticasone-salmeterol (ADVAIR HFA) 230-21 MCG/ACT inhaler, Inhale 2 puffs into the lungs 2 (two) times daily., Disp: 1 Inhaler, Rfl: 5 .  hydrochlorothiazide (HYDRODIURIL) 12.5 MG  tablet, TAKE 1 TABLET BY MOUTH EVERY DAY (Patient taking differently: Take 12.5 mg by mouth. Monday Wednesday Friday), Disp: 30 tablet, Rfl: 4 .  levocetirizine (XYZAL) 5 MG tablet, TAKE 1 TABLET BY MOUTH EVERY DAY IN THE EVENING, Disp: 90 tablet, Rfl: 1 .  LINZESS 145 MCG CAPS capsule, TAKE 1 CAPSULE BY MOUTH EVERY DAY ON AN EMPTY STOMACH AT LEAST 30 MINUTES BEFORE FIRST MEAL OF THE DAY, Disp: 90 capsule, Rfl: 1 .  losartan (COZAAR) 25 MG tablet, Take 25 mg by mouth daily. , Disp: , Rfl:  .  Magnesium 250 MG TABS, Take 250 mg by mouth daily. , Disp: , Rfl:  .  medroxyPROGESTERone (DEPO-PROVERA) 150 MG/ML injection, Inject 150 mg into the muscle every 3 (three) months., Disp: , Rfl:  .  metoprolol succinate  (TOPROL-XL) 100 MG 24 hr tablet, Take 100 mg by mouth daily. , Disp: , Rfl:  .  montelukast (SINGULAIR) 10 MG tablet, Take 1 tablet by mouth each evening for coughing or wheezing., Disp: 30 tablet, Rfl: 5 .  Olopatadine HCl (PAZEO) 0.7 % SOLN, Place 1 drop into both eyes daily as needed., Disp: 1 Bottle, Rfl: 5 .  pantoprazole (PROTONIX) 40 MG tablet, Take 1 tablet (40 mg total) by mouth daily., Disp: 30 tablet, Rfl: 0 .  rivaroxaban (XARELTO) 20 MG TABS tablet, Take 20 mg by mouth daily with supper. , Disp: , Rfl:    No Known Allergies   Review of Systems  Constitutional: Negative.   Eyes: Negative for blurred vision.  Respiratory: Negative.  Negative for shortness of breath.   Cardiovascular: Negative.  Negative for chest pain and palpitations.  Gastrointestinal: Negative.   Neurological: Negative.   Psychiatric/Behavioral: Negative.      Today's Vitals   05/24/19 1639  BP: 120/76  Pulse: 75  Temp: 98.6 F (37 C)  TempSrc: Oral  Weight: 295 lb 3.2 oz (133.9 kg)  Height: 5' 6.8" (1.697 m)   Body mass index is 46.51 kg/m.   Objective:  Physical Exam Vitals signs and nursing note reviewed.  Constitutional:      Appearance: Normal appearance.  HENT:     Head: Normocephalic and atraumatic.  Cardiovascular:     Rate and Rhythm: Normal rate and regular rhythm.     Heart sounds: Normal heart sounds.  Pulmonary:     Effort: Pulmonary effort is normal.     Breath sounds: Normal breath sounds.  Skin:    General: Skin is warm.  Neurological:     General: No focal deficit present.     Mental Status: She is alert.  Psychiatric:        Mood and Affect: Mood normal.        Behavior: Behavior normal.         Assessment And Plan:     1. Hypertensive heart disease without heart failure  Chronic, well controlled. She will continue with current meds. She is encouraged to avoid adding salt to her foods. She will rto in six months for re-evaluation.   2. Pre-operative  exam  EKG is in normal sinus rhythm. She denies SOB with walking up stairs and walking to the mailbox. She is limited only b/c of the knee pain.  Pt advised that I will complete paperwork once received.  - EKG 12-Lead  3. Encounter for surveillance of injectable contraceptive  UPT is negative.  Importance of calcium/vit D supplementation while on Depo was reiterated to the patient. It was explained this is  important for bone health. All questions were answered to her satisfaction. She will rto in 13 weeks for her next injection.   - POCT urine pregnancy - medroxyPROGESTERone (DEPO-PROVERA) injection 150 mg  4. Class 3 severe obesity due to excess calories with serious comorbidity and body mass index (BMI) of 45.0 to 49.9 in adult [facility](HCC)  Importance of achieving optimal weight to decrease risk of cardiovascular disease and cancers was discussed with the patient in full detail. Importance of regular exercise was discussed with the patient. She is encouraged to start slowly - start with 10 minutes twice daily at least three to four days per week and to gradually build to 30 minutes five days weekly. She was given tips to incorporate more activity into her daily routine - take stairs when possible, park farther away from her job, grocery stores, etc.    Gwynneth Alimentobyn N Shunda Rabadi, MD    THE PATIENT IS ENCOURAGED TO PRACTICE SOCIAL DISTANCING DUE TO THE COVID-19 PANDEMIC.

## 2019-06-13 ENCOUNTER — Other Ambulatory Visit: Payer: Self-pay

## 2019-06-13 MED ORDER — PANTOPRAZOLE SODIUM 40 MG PO TBEC: 40.0000 mg | DELAYED_RELEASE_TABLET | Freq: Every day | ORAL | 0 refills | Status: DC

## 2019-06-13 MED ORDER — LOSARTAN POTASSIUM 25 MG PO TABS: 25.0000 mg | ORAL_TABLET | Freq: Every day | ORAL | 0 refills | Status: DC

## 2019-06-13 MED ORDER — HYDROCHLOROTHIAZIDE 12.5 MG PO TABS: 12.5000 mg | ORAL_TABLET | Freq: Every day | ORAL | 4 refills | Status: DC

## 2019-06-21 ENCOUNTER — Ambulatory Visit (INDEPENDENT_AMBULATORY_CARE_PROVIDER_SITE_OTHER): Payer: 59 | Admitting: Family Medicine

## 2019-06-21 ENCOUNTER — Telehealth: Payer: Self-pay | Admitting: *Deleted

## 2019-06-21 ENCOUNTER — Other Ambulatory Visit: Payer: Self-pay

## 2019-06-21 ENCOUNTER — Encounter: Payer: Self-pay | Admitting: Family Medicine

## 2019-06-21 VITALS — BP 124/74 | HR 84 | Temp 98.7°F | Resp 20 | Ht 67.0 in | Wt 290.4 lb

## 2019-06-21 DIAGNOSIS — H1013 Acute atopic conjunctivitis, bilateral: Secondary | ICD-10-CM | POA: Diagnosis not present

## 2019-06-21 DIAGNOSIS — K219 Gastro-esophageal reflux disease without esophagitis: Secondary | ICD-10-CM | POA: Diagnosis not present

## 2019-06-21 DIAGNOSIS — J4541 Moderate persistent asthma with (acute) exacerbation: Secondary | ICD-10-CM

## 2019-06-21 DIAGNOSIS — J302 Other seasonal allergic rhinitis: Secondary | ICD-10-CM

## 2019-06-21 DIAGNOSIS — J3089 Other allergic rhinitis: Secondary | ICD-10-CM

## 2019-06-21 MED ORDER — AZELASTINE HCL 0.1 % NA SOLN: 1.0000 | Freq: Two times a day (BID) | NASAL | 5 refills | Status: DC

## 2019-06-21 MED ORDER — LEVALBUTEROL TARTRATE 45 MCG/ACT IN AERO: 2.0000 | INHALATION_SPRAY | RESPIRATORY_TRACT | 5 refills | Status: DC | PRN

## 2019-06-21 MED ORDER — FLUTICASONE PROPIONATE 50 MCG/ACT NA SUSP: NASAL | 5 refills | Status: DC

## 2019-06-21 MED ORDER — SPIRIVA RESPIMAT 1.25 MCG/ACT IN AERS: 2.0000 | INHALATION_SPRAY | Freq: Every day | RESPIRATORY_TRACT | 3 refills | Status: DC

## 2019-06-21 MED ORDER — FLOVENT HFA 220 MCG/ACT IN AERO: 2.0000 | INHALATION_SPRAY | Freq: Two times a day (BID) | RESPIRATORY_TRACT | 5 refills | Status: DC

## 2019-06-21 NOTE — Progress Notes (Addendum)
[address] [place] [id number] Dept: [phone]  FOLLOW UP NOTE  Patient ID: Tracey Badgerngela F Froelich, female    DOB: 09/22/1971  Age: 48 y.o. MRN: [id number] Date of Office Visit: 06/21/2019  Assessment  Chief Complaint: Asthma and Nasal Congestion  HPI Tracey Escobar is a 48 year old female who presents to the clinic for a follow up visit. She was last seen in this clinic on 10/19/2018 by Dr. Nunzio CobbsBobbitt for evaluation of asthma, allergic rhinitis, allergic conjunctivitis, and reflux.  At today's visit, she reports her asthma has not been well controlled with chest tightness in the morning, intermittent wheezing throughout the day, and intermittent dry cough.  She continues Advair discus 500-1 puff every 12 hours, montelukast 10 mg once a day, and albuterol inhaler 3-4 times a week in addition to albuterol via nebulizer twice over the last 2 weeks.  Allergic rhinitis is reported as not well controlled with symptoms including clear rhinorrhea, nasal congestion, ear fullness, and irritation in her throat.  She is currently using Flonase and azelastine nasal sprays once a day.  She is not currently using saline nasal spray.  Her last allergy injection was 01/11/2019 0.15 mL from the blue vial directed toward grass pollen, tree pollen, and dust mite.  Allergic conjunctivitis is reported as well controlled with over-the-counter Pataday eyedrops.  Reflux is reported as well controlled with pantoprazole daily.  Her current medications are listed in the chart.    Drug Allergies:  No Known Allergies  Physical Exam: BP 124/74 (BP Location: Right Arm, Patient Position: Sitting, Cuff Size: Large)   Pulse 84   Temp 98.7 F (37.1 C) (Oral)   Resp 20   Ht 5\' 7"  (1.702 m)   Wt 290 lb 6.4 oz (131.7 kg)   SpO2 100%   BMI 45.48 kg/m    Physical Exam Vitals signs reviewed.  Constitutional:      Appearance: Normal appearance.  HENT:     Head: Normocephalic and atraumatic.     Right Ear: Tympanic  membrane normal.     Left Ear: Tympanic membrane normal.     Nose:     Comments: Bilateral nares edematous and pale with clear nasal drainage noted.  Pharynx normal.  Ears normal.  Eyes normal.    Mouth/Throat:     Pharynx: Oropharynx is clear.  Eyes:     Conjunctiva/sclera: Conjunctivae normal.  Neck:     Musculoskeletal: Normal range of motion and neck supple.  Cardiovascular:     Rate and Rhythm: Normal rate and regular rhythm.     Heart sounds: Normal heart sounds. No murmur.  Pulmonary:     Effort: Pulmonary effort is normal.     Breath sounds: Normal breath sounds.     Comments: Lungs clear to auscultation Musculoskeletal: Normal range of motion.  Skin:    General: Skin is warm and dry.  Neurological:     Mental Status: She is alert and oriented to person, place, and time.  Psychiatric:        Mood and Affect: Mood normal.        Behavior: Behavior normal.        Thought Content: Thought content normal.        Judgment: Judgment normal.    Diagnostics: FVC 2.03, FEV1 1.59.  Predicted FVC 3.27, predicted FEV1 2.64.  Spirometry indicates moderate restriction.  Post bronchodilator therapy FVC 1.78, FEV1 1.41.  Post bronchodilator spirometry indicates moderate restriction with no significant bronchodilator response.  This is consistent with previous spirometry readings.  Assessment and Plan: 1. Moderate persistent asthma with acute exacerbation   2. Seasonal and perennial allergic rhinitis   3. Allergic conjunctivitis of both eyes   4. Gastroesophageal reflux disease, esophagitis presence not specified     Meds ordered this encounter  Medications  . Tiotropium Bromide Monohydrate (SPIRIVA RESPIMAT) 1.25 MCG/ACT AERS    Sig: Inhale 2 puffs into the lungs daily.    Dispense:  4 g    Refill:  3  . azelastine (ASTELIN) 0.1 % nasal spray    Sig: Place 1 spray into both nostrils 2 (two) times daily.    Dispense:  30 mL    Refill:  5  . fluticasone (FLONASE) 50 MCG/ACT  nasal spray    Sig: 2 sprays per nostril once daily for stuffy nose.    Dispense:  16 g    Refill:  5  . fluticasone (FLOVENT HFA) 220 MCG/ACT inhaler    Sig: Inhale 2 puffs into the lungs 2 (two) times daily.    Dispense:  1 Inhaler    Refill:  5  . levalbuterol (XOPENEX HFA) 45 MCG/ACT inhaler    Sig: Inhale 2 puffs into the lungs every 4 (four) hours as needed for wheezing.    Dispense:  1 Inhaler    Refill:  5    Patient Instructions  Asthma Begin Spiriva 1.25 mcg- Begin with 1 puff once a day to prevent cough and wheeze for the first 3 days, if you are not experiencing heart palpitations then advance to 2 puffs once a day and stay at that dose Stop Advair Discus 500-50  Begin Flovent 220-2 puffs twice a day with a spacer to prevent cough and wheeze Continue montelukast 10 mg once a day to prevent cough or wheeze Stop albuterol Begin levalbuterol (Xopenex) 2 puffs every 4 hours as needed for cough or wheeze Refer to pulmonary specialty for evaluation, pulmonary function testing, and treatment  Allergic rhinitis Restart your allergy immunotherapy injections next week if your breathing has returned to baseline.  Continue Flonase 1-2 sprays in each nostril once a day as needed for a stuffy nose Consider saline nasal rinses as needed for nasal symptoms. Use this before any medicated nasal sprays for best result For thick post nasal drainage, begin Mucinex [phone] mg twice a day as needed.   Allergic conjunctivitis Continue Pataday one drop in each eye once a day as needed for red, itchy eyes  Reflux Continue Protonix as previously prescribed Continue dietary and lifestyle modifications as listed below  Call the clinic if this treatment plan is not working well for you  Follow up in 2 months or sooner if needed.  Return in about 2 months (around 08/21/2019), or if symptoms worsen or fail to improve.    Thank you for the opportunity to care for this patient.  Please do not  hesitate to contact me with questions.  Gareth Morgan, FNP [facility]  ________________________________________________  I have provided oversight concerning [facility]'s evaluation and treatment of this patient's health issues addressed during today's encounter.  I agree with the assessment and therapeutic plan as outlined in the note.   Signed,   R Edgar Frisk, MD

## 2019-06-21 NOTE — Patient Instructions (Addendum)
Asthma Begin Spiriva 1.25 mcg- Begin with 1 puff once a day to prevent cough and wheeze for the first 3 days, if you are not experiencing heart palpitations then advance to 2 puffs once a day and stay at that dose Stop Advair Discus 500-50  Begin Flovent 220-2 puffs twice a day with a spacer to prevent cough and wheeze Continue montelukast 10 mg once a day to prevent cough or wheeze Stop albuterol Begin levalbuterol (Xopenex) 2 puffs every 4 hours as needed for cough or wheeze Refer to pulmonary specialty for evaluation, pulmonary function testing, and treatment  Allergic rhinitis Restart your allergy immunotherapy injections next week if your breathing has returned to baseline.  Continue Flonase 1-2 sprays in each nostril once a day as needed for a stuffy nose Consider saline nasal rinses as needed for nasal symptoms. Use this before any medicated nasal sprays for best result For thick post nasal drainage, begin Mucinex [phone] mg twice a day as needed.   Allergic conjunctivitis Continue Pataday one drop in each eye once a day as needed for red, itchy eyes  Reflux Continue Protonix as previously prescribed Continue dietary and lifestyle modifications as listed below  Call the clinic if this treatment plan is not working well for you  Follow up in 2 months or sooner if needed.   Lifestyle Changes for Controlling GERD When you have GERD, stomach acid feels as if it's backing up toward your mouth. Whether or not you take medication to control your GERD, your symptoms can often be improved with lifestyle changes.   Raise Your Head  Reflux is more likely to strike when you're lying down flat, because stomach fluid can  flow backward more easily. Raising the head of your bed 4-6 inches can help. To do this:  Slide blocks or books under the legs at the head of your bed. Or, place a wedge under  the mattress. Many foam stores can make a suitable wedge for you. The wedge  should  run from your waist to the top of your head.  Don't just prop your head on several pillows. This increases pressure on your  stomach. It can make GERD worse.  Watch Your Eating Habits Certain foods may increase the acid in your stomach or relax the lower esophageal sphincter, making GERD more likely. It's best to avoid the following:  Coffee, tea, and carbonated drinks (with and without caffeine)  Fatty, fried, or spicy food  Mint, chocolate, onions, and tomatoes  Any other foods that seem to irritate your stomach or cause you pain  Relieve the Pressure  Eat smaller meals, even if you have to eat more often.  Don't lie down right after you eat. Wait a few hours for your stomach to empty.  Avoid tight belts and tight-fitting clothes.  Lose excess weight.  Tobacco and Alcohol  Avoid smoking tobacco and drinking alcohol. They can make GERD symptoms worse.

## 2019-06-21 NOTE — Telephone Encounter (Signed)
Submitted PA for Levalbuterol HFA. Waiting for response from [employer].

## 2019-06-22 ENCOUNTER — Encounter: Payer: Self-pay | Admitting: Family Medicine

## 2019-06-22 NOTE — Telephone Encounter (Signed)
Received fax from [employer] status of cancelled- med is on list of covered drugs and PA is not required. Scanned.

## 2019-06-23 ENCOUNTER — Telehealth: Payer: Self-pay

## 2019-06-23 NOTE — Telephone Encounter (Signed)
Referral placed yesterday. Her breathing, at the time of her appointment, was not stable enough for surgery. Can you please put a rush on this referral. Thank you

## 2019-06-23 NOTE — Telephone Encounter (Signed)
Patient requesting status regarding [facility] Silvers[facility] referring patient to a pulmonologist.  Also patient has surgery scheduled 06/29/2019 and is not sure is she should still proceed with surgery as [facility] wanted her to wait until a pulmonologist was consulted. Please advise.

## 2019-06-24 ENCOUNTER — Encounter: Payer: Self-pay | Admitting: Internal Medicine

## 2019-06-26 ENCOUNTER — Encounter: Payer: Self-pay | Admitting: Internal Medicine

## 2019-06-26 ENCOUNTER — Telehealth: Payer: Self-pay

## 2019-06-26 MED ORDER — PREDNISONE 10 MG PO TABS: ORAL_TABLET | ORAL | 0 refills | Status: DC

## 2019-06-26 NOTE — Telephone Encounter (Signed)
Patient reporting nasal congestion, ear fullness, thick post nasal drainage and wheeze. She will take prednisone 10 mg twice a day for 4 days, then take 1 tablet on the 5th day, then stop. She will call the clinic if her symptoms do not improve or worsen. She has an appointment with cardiology on Monday and will follow up with [place] pul[facility] (we placed referral on Friday).

## 2019-06-26 NOTE — Telephone Encounter (Signed)
-----   Message from Dara Hoyer, FNP sent at 06/22/2019 10:27 AM EDT ----- Can you please refer this patient to pulmonary specialty for evaluation, pulmonary function testing,and treatment. Thank you

## 2019-06-26 NOTE — Telephone Encounter (Signed)
Referral has been placed to [facility] in [place].  Thank You

## 2019-06-27 ENCOUNTER — Telehealth: Payer: Self-pay | Admitting: Family Medicine

## 2019-06-27 NOTE — Telephone Encounter (Signed)
Referral has been placed in Epic.  I printed and faxed notes and referral to:  [facility] [address], [place] [address]  [phone]  They will contact patient to schedule.

## 2019-06-28 NOTE — Telephone Encounter (Signed)
Thank you :)

## 2019-06-28 NOTE — Telephone Encounter (Signed)
Dee, please refer patient per [facility].

## 2019-06-28 NOTE — Telephone Encounter (Signed)
I placed the referral also 2 days ago in epic.  Thanks

## 2019-07-10 NOTE — Telephone Encounter (Signed)
I called [facility] to follow up on her referral. They will contact the patient today.  Thanks

## 2019-07-10 NOTE — Telephone Encounter (Signed)
Thank you :)

## 2019-07-14 ENCOUNTER — Other Ambulatory Visit: Payer: Self-pay

## 2019-07-14 ENCOUNTER — Ambulatory Visit (INDEPENDENT_AMBULATORY_CARE_PROVIDER_SITE_OTHER): Payer: 59 | Admitting: Internal Medicine

## 2019-07-14 ENCOUNTER — Encounter: Payer: Self-pay | Admitting: Internal Medicine

## 2019-07-14 VITALS — BP 120/82 | HR 100 | Temp 97.8°F | Ht 67.0 in | Wt 298.6 lb

## 2019-07-14 DIAGNOSIS — J4540 Moderate persistent asthma, uncomplicated: Secondary | ICD-10-CM

## 2019-07-14 NOTE — Patient Instructions (Addendum)
It was nice to meet you. Your lungs sound clear today. We will get lung function tests and have you come back to see me. Call if breathing gets worse in interim.

## 2019-07-14 NOTE — Progress Notes (Signed)
Synopsis: Referred in Oct 2020 for abnormal spirometry by Hetty Blend, FNP  Subjective:   PATIENT ID: Tracey Escobar GENDER: female DOB: 12-28-1970, MRN: [id number]  Chief Complaint  Patient presents with  . Consult    Consult for Asthma, had asthma as a child. She reports she was told her spiro's are not getting better.    HPI 48 year old woman with longstanding allergies and asthma since childhood presenting for persistently abnormal spirometries.  She has been having worsening DOE and asthma control over past year but there is thought this was actually her Afib acting up.  She recently stopped her LABA and started LAMA, also placed on xopenex instead of ventolin, these changes have improved her symptoms dramatically.  Her last spirometry from allergy clinic just shows restriction. She is on a variety of sinus and allergy pills/sprays from allergy clinic and used to be on allergy shots, has plans to restart this soon.  She also has a knee replacement scheduled at some point once she is cleared from a respiratory and cardiac standpoint.  At this time it seems the knee pain is now the primary inhibiting factor in her ability to exercise.  Regarding exposures, she was on amiodarone for a year or two in past 5 years. She is a never smoker.  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    -2018 Eos 100 -IgE 2018 7 -PSG 2019 mild OSA AHI 9   Past Medical History:  Diagnosis Date  . Arthritis   . Asthma   . Atrial fibrillation (HCC)   . Dysrhythmia    A-Fib  . GERD (gastroesophageal reflux disease)   . Hypertension   . Irritable bowel   . Pneumonia   . Sleep apnea   . Sleep apnea   . Vitamin D deficiency      Family History  Problem Relation Age of Onset  . Hyperlipidemia Mother   . Hypertension Mother   . Hyperlipidemia Father   . Hypertension Father   . COPD Father   . Heart failure Father   . Diabetes Brother   . Hypertension Maternal  Grandmother   . Seizures Maternal Grandmother   . Cancer Paternal Grandfather   . Cancer Brother   . Allergic rhinitis Neg Hx   . Angioedema Neg Hx   . Asthma Neg Hx   . Eczema Neg Hx   . Immunodeficiency Neg Hx   . Urticaria Neg Hx      Past Surgical History:  Procedure Laterality Date  . COLONOSCOPY    . DILITATION & CURRETTAGE/HYSTROSCOPY WITH NOVASURE ABLATION N/A 10/06/2018   Procedure: DILATATION & CURETTAGE/HYSTEROSCOPY WITH NOVASURE ABLATION;  Surgeon: Kirkland Hun, MD;  Location: [facility];  Service: Gynecology;  Laterality: N/A;  . ENDOMETRIAL ABLATION    . EXCISION MASS UPPER EXTREMETIES Right 02/25/2016   Procedure: EXCISION RIGHT UPPER ARM MASS;  Surgeon: Abigail Miyamoto, MD;  Location: [facility];  Service: General;  Laterality: Right;    Social History   Socioeconomic History  . Marital status: Single    Spouse name: Not on file  . Number of children: Not on file  . Years of education: Not on file  . Highest education level: Not on file  Occupational History  . Not on file  Social Needs  . Financial resource strain: Not on file  . Food insecurity    Worry: Not on file    Inability: Not on file  . Transportation  needs    Medical: Not on file    Non-medical: Not on file  Tobacco Use  . Smoking status: Never Smoker  . Smokeless tobacco: Never Used  Substance and Sexual Activity  . Alcohol use: Yes    Alcohol/week: 1.0 standard drinks    Types: 1 Glasses of wine per week    Comment: rare use  . Drug use: No  . Sexual activity: Never  Lifestyle  . Physical activity    Days per week: Not on file    Minutes per session: Not on file  . Stress: Not on file  Relationships  . Social Musician on phone: Not on file    Gets together: Not on file    Attends religious service: Not on file    Active member of club or organization: Not on file    Attends meetings of clubs or organizations: Not on file    Relationship status: Not on file  . Intimate  partner violence    Fear of current or ex partner: Not on file    Emotionally abused: Not on file    Physically abused: Not on file    Forced sexual activity: Not on file  Other Topics Concern  . Not on file  Social History Narrative  . Not on file     No Known Allergies   Outpatient Medications Prior to Visit  Medication Sig Dispense Refill  . albuterol (PROVENTIL) (2.5 MG/3ML) 0.083% nebulizer solution Take 3 mLs (2.5 mg total) by nebulization every 6 (six) hours as needed for wheezing or shortness of breath. 75 mL 1  . Albuterol Sulfate (PROAIR RESPICLICK) 108 (90 Base) MCG/ACT AEPB Inhale 2 puffs into the lungs every 4 (four) hours as needed. 1 each 1  . azelastine (ASTELIN) 0.1 % nasal spray Place 1 spray into both nostrils 2 (two) times daily. 30 mL 5  . calcium carbonate (OS-CAL - DOSED IN MG OF ELEMENTAL CALCIUM) 1250 (500 Ca) MG tablet Take 1 tablet by mouth daily with breakfast.    . Cholecalciferol (VITAMIN D3) 5000 UNITS TABS Take 1 tablet by mouth daily.    . cycloSPORINE (RESTASIS) 0.05 % ophthalmic emulsion Place 1 drop into both eyes 2 (two) times daily.    Marland Kitchen EPINEPHrine 0.3 mg/0.3 mL IJ SOAJ injection Use as directed for anaphylaxis 2 Device 1  . fluticasone (FLONASE) 50 MCG/ACT nasal spray 2 sprays per nostril once daily for stuffy nose. 16 g 5  . fluticasone (FLOVENT HFA) 220 MCG/ACT inhaler Inhale 2 puffs into the lungs 2 (two) times daily. 1 Inhaler 5  . hydrochlorothiazide (HYDRODIURIL) 12.5 MG tablet Take 1 tablet (12.5 mg total) by mouth daily. 30 tablet 4  . levalbuterol (XOPENEX HFA) 45 MCG/ACT inhaler Inhale 2 puffs into the lungs every 4 (four) hours as needed for wheezing. 1 Inhaler 5  . levocetirizine (XYZAL) 5 MG tablet TAKE 1 TABLET BY MOUTH EVERY DAY IN THE EVENING 90 tablet 1  . LINZESS 145 MCG CAPS capsule TAKE 1 CAPSULE BY MOUTH EVERY DAY ON AN EMPTY STOMACH AT LEAST 30 MINUTES BEFORE FIRST MEAL OF THE DAY (Patient taking differently: Take 145 mcg by  mouth. 2 capsules in the morning) 90 capsule 1  . losartan (COZAAR) 25 MG tablet Take 1 tablet (25 mg total) by mouth daily. 90 tablet 0  . Magnesium 250 MG TABS Take 250 mg by mouth daily.     . medroxyPROGESTERone (DEPO-PROVERA) 150 MG/ML injection Inject 150 mg  into the muscle every 3 (three) months.    . metoprolol succinate (TOPROL-XL) 100 MG 24 hr tablet Take 100 mg by mouth daily.     . montelukast (SINGULAIR) 10 MG tablet Take 1 tablet by mouth each evening for coughing or wheezing. 30 tablet 5  . pantoprazole (PROTONIX) 40 MG tablet Take 1 tablet (40 mg total) by mouth daily. 30 tablet 0  . RESTASIS 0.05 % ophthalmic emulsion     . rivaroxaban (XARELTO) 20 MG TABS tablet Take 20 mg by mouth daily with supper.     . Tiotropium Bromide Monohydrate (SPIRIVA RESPIMAT) 1.25 MCG/ACT AERS Inhale 2 puffs into the lungs daily. 4 g 3  . ADVAIR DISKUS 500-50 MCG/DOSE AEPB     . predniSONE (DELTASONE) 10 MG tablet Take 2 tablets twice a day for 4 days, then take 1 tablet on the 5th day, then stop. (Patient not taking: Reported on 07/14/2019) 9 tablet 0   No facility-administered medications prior to visit.      Positive Symptoms in bold:  Constitutional fevers, chills, weight loss, fatigue, anorexia, malaise  Eyes decreased vision, double vision, eye irritation  Ears, Nose, Mouth, Throat sore throat, trouble swallowing, sinus congestion  Cardiovascular chest pain, paroxysmal nocturnal dyspnea, lower ext edema, palpitations   Respiratory SOB, cough, DOE, hemoptysis, wheezing  Gastrointestinal nausea, vomiting, diarrhea  Genitourinary burning with urination, trouble urinating  Musculoskeletal joint aches, joint swelling, back pain  Integumentary  rashes, skin lesions  Neurological focal weakness, focal numbness, trouble speaking, headaches  Psychiatric depression, anxiety, confusion  Endocrine polyuria, polydipsia, cold intolerance, heat intolerance  Hematologic abnormal bruising, abnormal  bleeding, unexplained nose bleeds  Allergic/Immunologic recurrent infections, hives, swollen lymph nodes      Objective:  GEN: middle aged woman in NAD HEENT: [place] 3, MMM CV: RRR, ext warm PULM: completely clear, no accessory muscle use GI: Soft, +BS EXT: no edema NEURO: normal ambulation, moves all 4 ext to command PSYCH: AOx3, good insight SKIN: no rashes    Vitals:   07/14/19 1625  BP: 120/82  Pulse: 100  Temp: 97.8 F (36.6 C)  TempSrc: Temporal  SpO2: 99%  Weight: 298 lb 9.6 oz (135.4 kg)  Height:  (1.702 m)   99% on RA BMI Readings from Last 3 Encounters:  07/14/19 46.77 kg/m  06/21/19 45.48 kg/m  05/24/19 46.51 kg/m   Wt Readings from Last 3 Encounters:  07/14/19 298 lb 9.6 oz (135.4 kg)  06/21/19 290 lb 6.4 oz (131.7 kg)  05/24/19 295 lb 3.2 oz (133.9 kg)     CBC    Component Value Date/Time   WBC 7.3 03/09/2019 1653   WBC 5.9 09/27/2018 1059   RBC 4.59 03/09/2019 1653   RBC 4.88 09/27/2018 1059   HGB 13.1 03/09/2019 1653   HCT 39.6 03/09/2019 1653   PLT 362 03/09/2019 1653   MCV 86 03/09/2019 1653   MCH 28.5 03/09/2019 1653   MCH 27.0 09/27/2018 1059   MCHC 33.1 03/09/2019 1653   MCHC 30.9 09/27/2018 1059   RDW 13.0 03/09/2019 1653   LYMPHSABS 3.0 05/11/2017 1623   EOSABS 0.1 05/11/2017 1623   BASOSABS 0.0 05/11/2017 1623     Chest Imaging: None  Pulmonary Functions Testing Results: No flowsheet data found.  FeNO: None  Pathology: None  Echocardiogram: None   Heart Catheterization: NA    Assessment & Plan:  # Moderate persistent asthma I guess but IgE and eos have been negative, would be a neutrophil driven process  which is tough to treat unless it is all environment driven. # Abnormal spirometry I suspect related to inadequate expiratory times, will need to repeat # GERD on PPI # Afib on BB and AC, previous exposure to amiodarone # Sinusitis/rhinitis # DJD needing knee surgery to get back to exercising     Discussion: - Continue astelin, flonase sprays, singulair, xyzal - Continue spiriva and PRN xopenex - Full PFTs, consider ILD scan if DLCO corrected to [facility] reduced - She needs no further testing from my standpoint prior to knee surgery; getting her exercising again will be great benefit - At some point she may benefit from an MCT so we can consider cutting down on her meds - f/u after full PFTs   Current Outpatient Medications:  .  albuterol (PROVENTIL) (2.5 MG/3ML) 0.083% nebulizer solution, Take 3 mLs (2.5 mg total) by nebulization every 6 (six) hours as needed for wheezing or shortness of breath., Disp: 75 mL, Rfl: 1 .  Albuterol Sulfate (PROAIR RESPICLICK) 108 (90 Base) MCG/ACT AEPB, Inhale 2 puffs into the lungs every 4 (four) hours as needed., Disp: 1 each, Rfl: 1 .  azelastine (ASTELIN) 0.1 % nasal spray, Place 1 spray into both nostrils 2 (two) times daily., Disp: 30 mL, Rfl: 5 .  calcium carbonate (OS-CAL - DOSED IN MG OF ELEMENTAL CALCIUM) 1250 (500 Ca) MG tablet, Take 1 tablet by mouth daily with breakfast., Disp: , Rfl:  .  Cholecalciferol (VITAMIN D3) 5000 UNITS TABS, Take 1 tablet by mouth daily., Disp: , Rfl:  .  cycloSPORINE (RESTASIS) 0.05 % ophthalmic emulsion, Place 1 drop into both eyes 2 (two) times daily., Disp: , Rfl:  .  EPINEPHrine 0.3 mg/0.3 mL IJ SOAJ injection, Use as directed for anaphylaxis, Disp: 2 Device, Rfl: 1 .  fluticasone (FLONASE) 50 MCG/ACT nasal spray, 2 sprays per nostril once daily for stuffy nose., Disp: 16 g, Rfl: 5 .  fluticasone (FLOVENT HFA) 220 MCG/ACT inhaler, Inhale 2 puffs into the lungs 2 (two) times daily., Disp: 1 Inhaler, Rfl: 5 .  hydrochlorothiazide (HYDRODIURIL) 12.5 MG tablet, Take 1 tablet (12.5 mg total) by mouth daily., Disp: 30 tablet, Rfl: 4 .  levalbuterol (XOPENEX HFA) 45 MCG/ACT inhaler, Inhale 2 puffs into the lungs every 4 (four) hours as needed for wheezing., Disp: 1 Inhaler, Rfl: 5 .  levocetirizine (XYZAL) 5 MG tablet, TAKE  1 TABLET BY MOUTH EVERY DAY IN THE EVENING, Disp: 90 tablet, Rfl: 1 .  LINZESS 145 MCG CAPS capsule, TAKE 1 CAPSULE BY MOUTH EVERY DAY ON AN EMPTY STOMACH AT LEAST 30 MINUTES BEFORE FIRST MEAL OF THE DAY (Patient taking differently: Take 145 mcg by mouth. 2 capsules in the morning), Disp: 90 capsule, Rfl: 1 .  losartan (COZAAR) 25 MG tablet, Take 1 tablet (25 mg total) by mouth daily., Disp: 90 tablet, Rfl: 0 .  Magnesium 250 MG TABS, Take 250 mg by mouth daily. , Disp: , Rfl:  .  medroxyPROGESTERone (DEPO-PROVERA) 150 MG/ML injection, Inject 150 mg into the muscle every 3 (three) months., Disp: , Rfl:  .  metoprolol succinate (TOPROL-XL) 100 MG 24 hr tablet, Take 100 mg by mouth daily. , Disp: , Rfl:  .  montelukast (SINGULAIR) 10 MG tablet, Take 1 tablet by mouth each evening for coughing or wheezing., Disp: 30 tablet, Rfl: 5 .  pantoprazole (PROTONIX) 40 MG tablet, Take 1 tablet (40 mg total) by mouth daily., Disp: 30 tablet, Rfl: 0 .  RESTASIS 0.05 % ophthalmic emulsion, , Disp: ,  Rfl:  .  rivaroxaban (XARELTO) 20 MG TABS tablet, Take 20 mg by mouth daily with supper. , Disp: , Rfl:  .  Tiotropium Bromide Monohydrate (SPIRIVA RESPIMAT) 1.25 MCG/ACT AERS, Inhale 2 puffs into the lungs daily., Disp: 4 g, Rfl: Alpha, MD [facility] 07/14/2019 4:39 PM

## 2019-07-24 ENCOUNTER — Other Ambulatory Visit: Payer: Self-pay | Admitting: *Deleted

## 2019-07-24 MED ORDER — PANTOPRAZOLE SODIUM 40 MG PO TBEC: 40.0000 mg | DELAYED_RELEASE_TABLET | Freq: Every day | ORAL | 5 refills | Status: DC

## 2019-08-09 IMAGING — MR MRI OF THE RIGHT KNEE WITHOUT CONTRAST
7 series · 40 of 40 positions shown · non-contrast
Comparison: None.

CLINICAL DATA: Chronic right knee pain and swelling.

EXAM:
MRI OF THE RIGHT KNEE WITHOUT CONTRAST
TECHNIQUE: Multiplanar, multisequence MR imaging of the knee was performed. No
intravenous contrast was administered.

[Series 6: T2 fat-sat · axial · right · 4.0mm · 0.50mm/px · z∈[-55,+80]mm · 7 of 32 slices shown (1 of 4)]
[im 1/32]
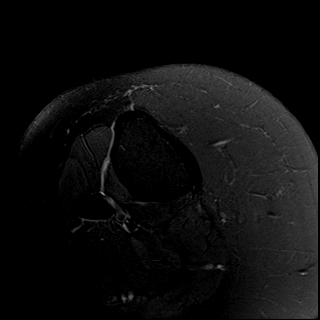
[im 6/32]
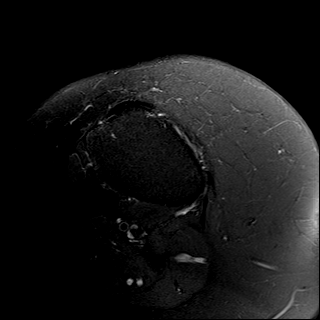
[im 11/32]
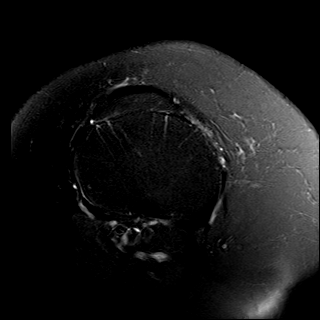
[im 16/32]
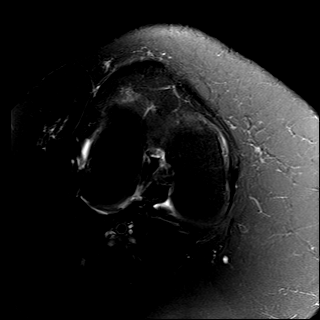
[im 21/32]
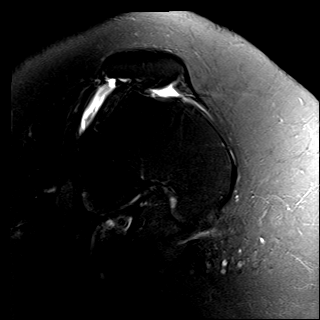
[im 26/32]
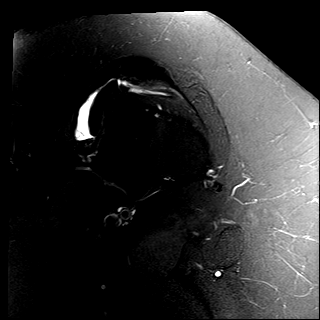
[im 32/32]
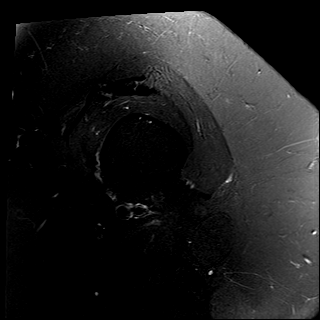

[Series 7: T2 fat-sat · coronal · right · 4.0mm · 0.50mm/px · 5 of 26 slices shown (2 of 4)]
[im 1/26]
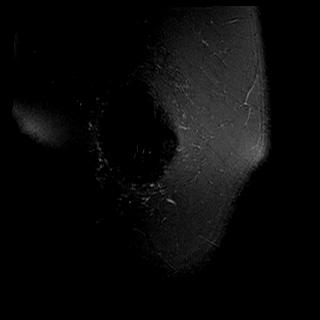
[im 7/26]
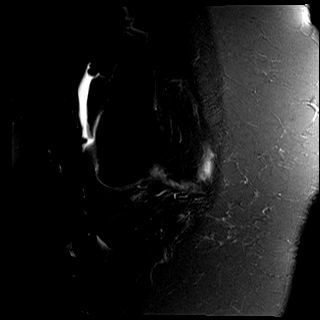
[im 13/26]
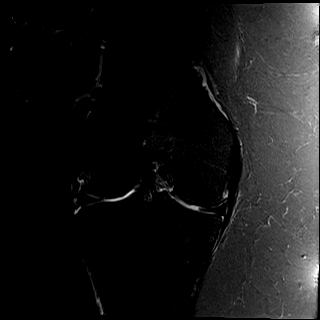
[im 19/26]
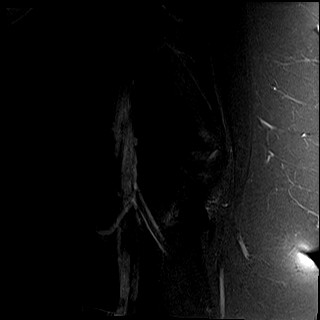
[im 26/26]
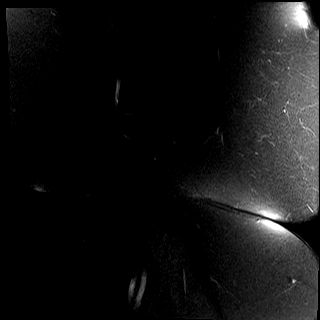

[Series 8: T1 · coronal · right · 4.0mm · 0.50mm/px · 5 of 26 slices shown]
[im 1/26]
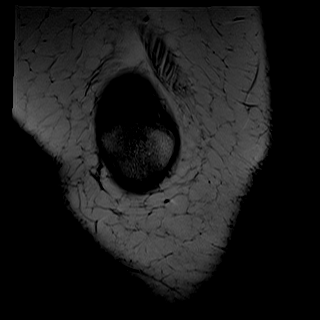
[im 7/26]
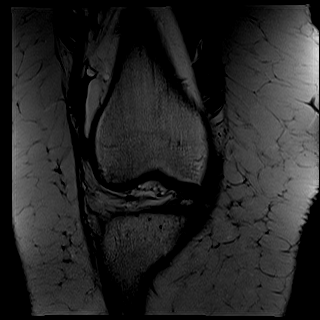
[im 13/26]
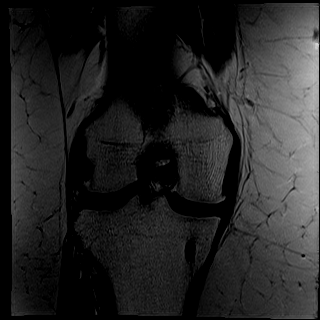
[im 19/26]
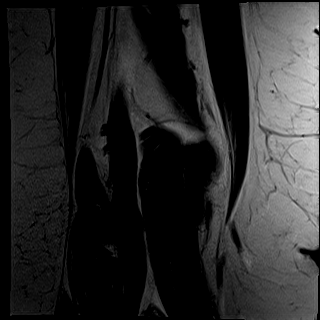
[im 26/26]
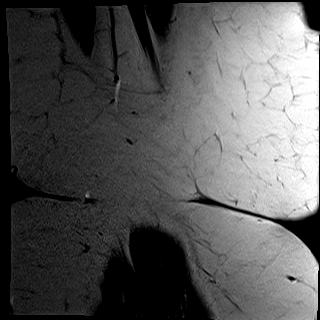

[Series 10: PD fat-sat · sagittal · right · 3.3mm · 0.50mm/px · 5 of 25 slices shown (1 of 2)]
[im 1/25]
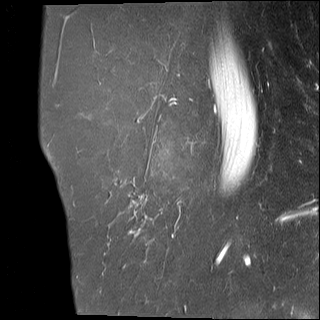
[im 7/25]
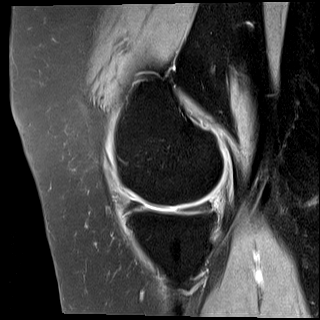
[im 13/25]
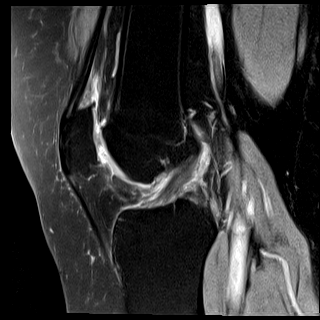
[im 19/25]
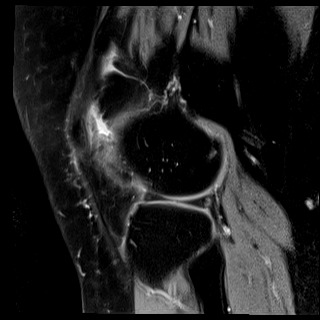
[im 25/25]
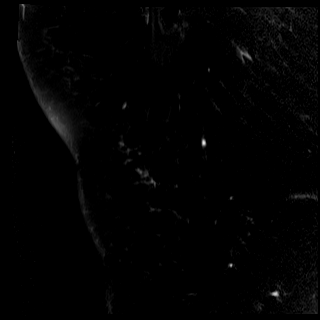

[Series 11: T2 fat-sat · sagittal · right · 3.3mm · 0.50mm/px · 5 of 25 slices shown (3 of 4)]
[im 1/25]
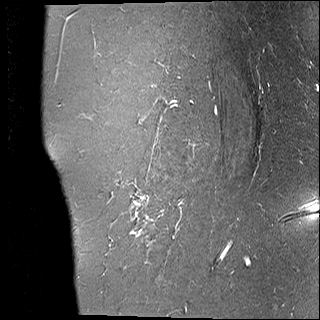
[im 7/25]
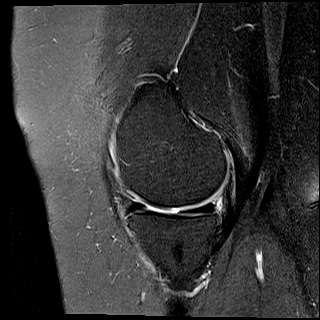
[im 13/25]
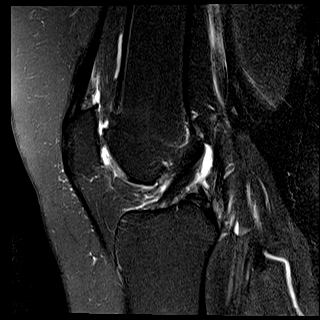
[im 19/25]
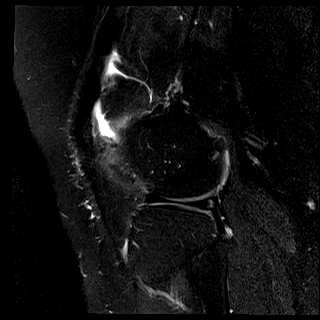
[im 25/25]
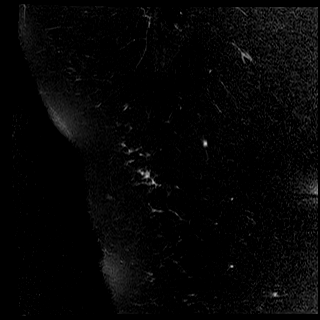

[Series 12: T2 fat-sat · axial · right · 4.0mm · 0.50mm/px · z∈[-55,+80]mm · 7 of 32 slices shown (4 of 4)]
[im 1/32]
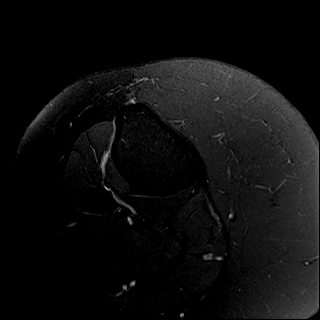
[im 6/32]
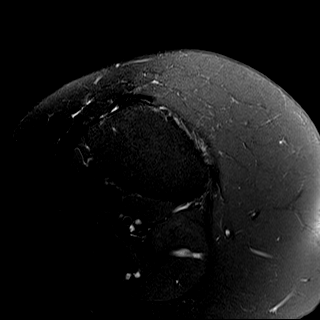
[im 11/32]
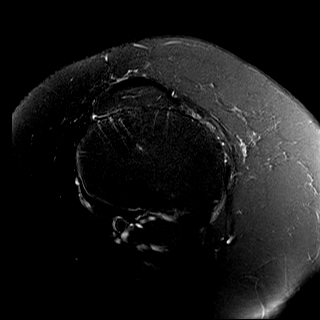
[im 16/32]
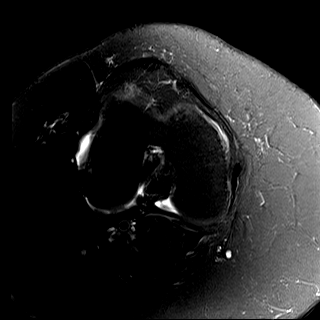
[im 21/32]
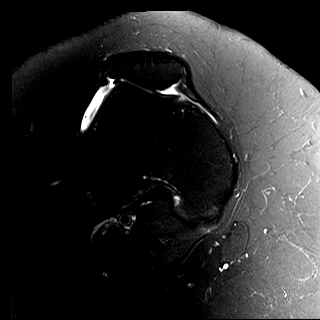
[im 26/32]
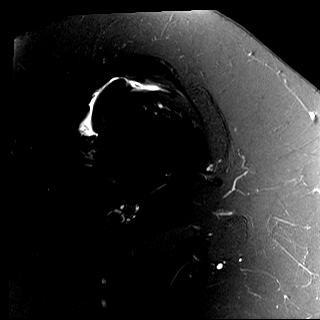
[im 32/32]
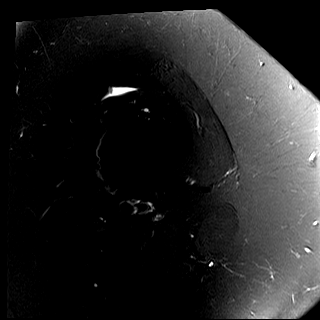

[Series 13: PD fat-sat · coronal · right · 3.3mm · 0.62mm/px · 6 of 29 slices shown (2 of 2)]
[im 1/29]
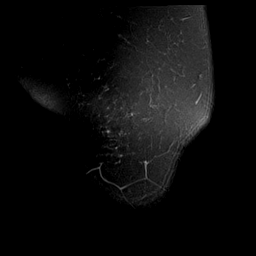
[im 6/29]
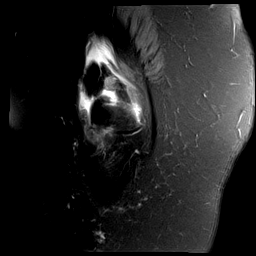
[im 12/29]
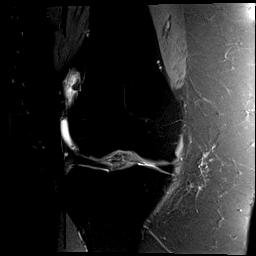
[im 17/29]
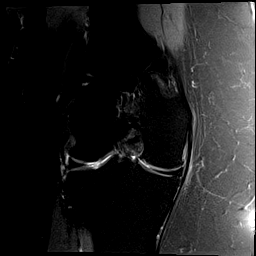
[im 23/29]
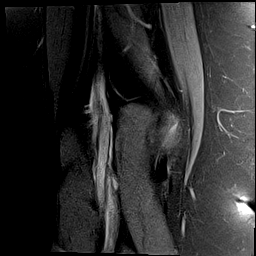
[im 29/29]
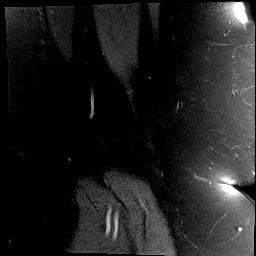

[40 of 40 positions shown; findings below may reference images not displayed]

FINDINGS: MENISCI

Medial meniscus:  Intact.

Lateral meniscus:  Intact.

LIGAMENTS

Cruciates:  Intact ACL and PCL.

Collaterals: Medial collateral ligament is intact. Lateral
collateral ligament complex is intact.

CARTILAGE

Patellofemoral: Moderate diffuse cartilage thinning with near
full-thickness cartilage defects over the trochlear groove and
inferior lateral patellar facet.

Medial: Mild diffuse cartilage thinning with focal 7 x 4 mm
full-thickness defect over the central weight-bearing medial femoral
condyle.

Lateral: Focal partial-thickness cartilage loss with full-thickness
fissuring over the central weight-bearing lateral femoral condyle. 3
x 3 mm full-thickness defect over the central lateral tibial
plateau.

Joint: Trace joint effusion. Minimal edema within Hoffa's fat. No
plical thickening.

Popliteal Fossa: No significant Baker cyst. Intact popliteus tendon.

Extensor Mechanism: Intact quadriceps tendon and patellar tendon.
Intact medial and lateral patellar retinaculum. Intact MPFL.

Bones:  No acute fracture or dislocation.  No focal bone lesion.

Other: None.
IMPRESSION: 1. No meniscal or ligamentous injury.
2. Tricompartmental osteoarthritis, moderate in the patellofemoral
compartment.

## 2019-08-10 ENCOUNTER — Other Ambulatory Visit: Payer: Self-pay

## 2019-08-10 ENCOUNTER — Encounter: Payer: Self-pay | Admitting: Internal Medicine

## 2019-08-10 ENCOUNTER — Ambulatory Visit: Payer: 59 | Admitting: Internal Medicine

## 2019-08-10 VITALS — BP 124/78 | HR 58 | Temp 98.9°F | Ht 68.6 in | Wt 295.4 lb

## 2019-08-10 DIAGNOSIS — I480 Paroxysmal atrial fibrillation: Secondary | ICD-10-CM

## 2019-08-10 DIAGNOSIS — Z23 Encounter for immunization: Secondary | ICD-10-CM | POA: Diagnosis not present

## 2019-08-10 DIAGNOSIS — I119 Hypertensive heart disease without heart failure: Secondary | ICD-10-CM

## 2019-08-10 DIAGNOSIS — Z09 Encounter for follow-up examination after completed treatment for conditions other than malignant neoplasm: Secondary | ICD-10-CM

## 2019-08-10 MED ORDER — PANTOPRAZOLE SODIUM 40 MG PO TBEC: 40.0000 mg | DELAYED_RELEASE_TABLET | Freq: Every day | ORAL | 1 refills | Status: DC

## 2019-08-10 NOTE — Patient Instructions (Signed)
Atrial Fibrillation Atrial fibrillation is a type of irregular or rapid heartbeat (arrhythmia). In atrial fibrillation, the top part of the heart (atria) quivers in a chaotic pattern. This makes the heart unable to pump blood normally. Having atrial fibrillation can increase your risk for other health problems, such as:  Blood can pool in the atria and form clots. If a clot travels to the brain, it can cause a stroke.  The heart muscle may weaken from the irregular blood flow. This can cause heart failure. Atrial fibrillation may start suddenly and stop on its own, or it may become a long-lasting problem. What are the causes? This condition is caused by some heart-related conditions or procedures, including:  High blood pressure. This is the most common cause.  Heart failure.  Heart valve conditions.  Inflammation of the sac that surrounds the heart (pericarditis).  Heart surgery.  Coronary artery disease.  Certain heart rhythm disorders, such as Wolf-Parkinson-White syndrome. Other causes include:  Pneumonia.  Obstructive sleep apnea.  Lung cancer.  Thyroid problems, especially if the thyroid is overactive (hyperthyroidism).  Excessive alcohol or drug use. Sometimes, the cause of this condition is not known. What increases the risk? This condition is more likely to develop in:  Older people.  People who smoke.  People who have diabetes mellitus.  People who are overweight (obese).  Athletes who exercise vigorously.  People who have a family history. What are the signs or symptoms? Symptoms of this condition include:  A feeling that your heart is beating rapidly or irregularly.  A feeling of discomfort or pain in your chest.  Shortness of breath.  Sudden light-headedness or weakness.  Getting tired easily during exercise. In some cases, there are no symptoms. How is this diagnosed? Your health care provider may be able to detect atrial fibrillation when  taking your pulse. If detected, this condition may be diagnosed with:  Electrocardiogram (ECG).  Ambulatory cardiac monitor. This device records your heartbeats for 24 hours or more.  Transthoracic echocardiogram (TTE) to evaluate how blood flows through your heart.  Transesophageal echocardiogram (TEE) to view more detailed images of your heart.  A stress test.  Imaging tests, such as a CT scan or chest X-ray.  Blood tests. How is this treated? This condition may be treated with:  Medicines to slow down the heart rate or bring the heart's rhythm back to normal.  Medicines to prevent blood clots from forming.  Electrical cardioversion. This delivers a low-energy shock to the heart to reset its rhythm.  Ablation. This procedure destroys the part of the heart tissue that sends abnormal signals.  Left atrial appendage occlusion/excision. This seals off a common place in the atria where blood clots can form (left atrial appendage). The goal of treatment is to prevent blood clots from forming and to keep your heart beating at a normal rate and rhythm. Treatment depends on underlying medical conditions and how you feel when you are experiencing fibrillation. Follow these instructions at home: Medicines  Take over-the counter and prescription medicines only as told by your health care provider.  If your health care provider prescribed a blood-thinning medicine (anticoagulant), take it exactly as told. Taking too much blood-thinning medicine can cause bleeding. Taking too little can enable a blood clot to form and travel to the brain, causing a stroke. Lifestyle      Do not use any products that contain nicotine or tobacco, such as cigarettes and e-cigarettes. If you need help quitting, ask your health   care provider.  Do not drink beverages that contain caffeine, such as coffee, soda, and tea.  Follow diet instructions as told by your health care provider.  Exercise regularly as  told by your health care provider.  Do not drink alcohol. General instructions  If you have obstructive sleep apnea, manage your condition as told by your health care provider.  Maintain a healthy weight. Do not use diet pills unless your health care provider approves. Diet pills may make heart problems worse.  Keep all follow-up visits as told by your health care provider. This is important. Contact a health care provider if you:  Notice a change in the rate, rhythm, or strength of your heartbeat.  Are taking an anticoagulant and you notice increased bruising.  Tire more easily when you exercise or exert yourself.  Have a sudden change in weight. Get help right away if you have:   Chest pain, abdominal pain, sweating, or weakness.  Difficulty breathing.  Blood in your vomit, stool (feces), or urine.  Any symptoms of a stroke. "BE FAST" is an easy way to remember the main warning signs of a stroke: ? B - Balance. Signs are dizziness, sudden trouble walking, or loss of balance. ? E - Eyes. Signs are trouble seeing or a sudden change in vision. ? F - Face. Signs are sudden weakness or numbness of the face, or the face or eyelid drooping on one side. ? A - Arms. Signs are weakness or numbness in an arm. This happens suddenly and usually on one side of the body. ? S - Speech. Signs are sudden trouble speaking, slurred speech, or trouble understanding what people say. ? T - Time. Time to call emergency services. Write down what time symptoms started.  Other signs of a stroke, such as: ? A sudden, severe headache with no known cause. ? Nausea or vomiting. ? Seizure. These symptoms may represent a serious problem that is an emergency. Do not wait to see if the symptoms will go away. Get medical help right away. Call your local emergency services (911 in the [place] Do not drive yourself to the hospital. Summary  Atrial fibrillation is a type of irregular or rapid heartbeat  (arrhythmia).  Symptoms include a feeling that your heart is beating fast or irregularly. In some cases, you may not have symptoms.  The condition is treated with medicines to slow down the heart rate or bring the heart's rhythm back to normal. You may also need blood-thinning medicines to prevent blood clots.  Get help right away if you have symptoms or signs of a stroke. This information is not intended to replace advice given to you by your health care provider. Make sure you discuss any questions you have with your health care provider. Document Released: 09/21/2005 Document Revised: 11/11/2017 Document Reviewed: 11/12/2017 [employer] Patient Education  2020 [employer]  

## 2019-08-10 NOTE — Progress Notes (Signed)
Subjective:     Patient ID: Tracey Escobar , female    DOB: 1971/09/25 , 48 y.o.   MRN: [id number]   Chief Complaint  Patient presents with  . Hospitalization Follow-up    HPI  She is here today for f/u recent hospitalization.  She was admitted to [facility] on 10/23 for further evaluation of atrial flutter with RVR. She reports she was home with HR 190s, she contacted her cardiologist, who suggested she go to ER. She presented with palpitations, dyspnea and chest discomfort.  By the time she arrived to the ER, she was in sinus tachycardia. She had taken metoprolol prior to arrival. Her workup was negative. Cause for arrhythmia was not found during her admission. She was discharged in stable condition on 07/29/2019. She has not had any further symptoms since discharge. She feels well today.     Past Medical History:  Diagnosis Date  . Arthritis   . Asthma   . Atrial fibrillation ([place])   . Dysrhythmia    A-Fib  . GERD (gastroesophageal reflux disease)   . Hypertension   . Irritable bowel   . Pneumonia   . Sleep apnea   . Sleep apnea   . Vitamin D deficiency      Family History  Problem Relation Age of Onset  . Hyperlipidemia Mother   . Hypertension Mother   . Hyperlipidemia Father   . Hypertension Father   . COPD Father   . Heart failure Father   . Diabetes Brother   . Hypertension Maternal Grandmother   . Seizures Maternal Grandmother   . Cancer Paternal Grandfather   . Cancer Brother   . Allergic rhinitis Neg Hx   . Angioedema Neg Hx   . Asthma Neg Hx   . Eczema Neg Hx   . Immunodeficiency Neg Hx   . Urticaria Neg Hx      Current Outpatient Medications:  .  amiodarone (PACERONE) 200 MG tablet, Take 200 mg by mouth daily., Disp: , Rfl:  .  azelastine (ASTELIN) 0.1 % nasal spray, Place 1 spray into both nostrils 2 (two) times daily., Disp: 30 mL, Rfl: 5 .  calcium carbonate (OS-CAL - DOSED IN MG OF ELEMENTAL CALCIUM) 1250 (500 Ca) MG tablet,  Take 1 tablet by mouth daily with breakfast., Disp: , Rfl:  .  Cholecalciferol (VITAMIN D3) 5000 UNITS TABS, Take 1 tablet by mouth daily., Disp: , Rfl:  .  cycloSPORINE (RESTASIS) 0.05 % ophthalmic emulsion, Place 1 drop into both eyes 2 (two) times daily., Disp: , Rfl:  .  EPINEPHrine 0.3 mg/0.3 mL IJ SOAJ injection, Use as directed for anaphylaxis, Disp: 2 Device, Rfl: 1 .  fluticasone (FLONASE) 50 MCG/ACT nasal spray, 2 sprays per nostril once daily for stuffy nose., Disp: 16 g, Rfl: 5 .  fluticasone (FLOVENT HFA) 220 MCG/ACT inhaler, Inhale 2 puffs into the lungs 2 (two) times daily., Disp: 1 Inhaler, Rfl: 5 .  hydrochlorothiazide (HYDRODIURIL) 12.5 MG tablet, Take 1 tablet (12.5 mg total) by mouth daily., Disp: 30 tablet, Rfl: 4 .  levalbuterol (XOPENEX HFA) 45 MCG/ACT inhaler, Inhale 2 puffs into the lungs every 4 (four) hours as needed for wheezing., Disp: 1 Inhaler, Rfl: 5 .  levocetirizine (XYZAL) 5 MG tablet, TAKE 1 TABLET BY MOUTH EVERY DAY IN THE EVENING, Disp: 90 tablet, Rfl: 1 .  LINZESS 145 MCG CAPS capsule, TAKE 1 CAPSULE BY MOUTH EVERY DAY ON AN EMPTY STOMACH AT LEAST 30 MINUTES BEFORE FIRST MEAL  OF THE DAY (Patient taking differently: Take 145 mcg by mouth. 2 capsules in the morning), Disp: 90 capsule, Rfl: 1 .  losartan (COZAAR) 25 MG tablet, Take 1 tablet (25 mg total) by mouth daily., Disp: 90 tablet, Rfl: 0 .  Magnesium 250 MG TABS, Take 250 mg by mouth daily. , Disp: , Rfl:  .  medroxyPROGESTERone (DEPO-PROVERA) 150 MG/ML injection, Inject 150 mg into the muscle every 3 (three) months., Disp: , Rfl:  .  metoprolol succinate (TOPROL-XL) 100 MG 24 hr tablet, Take 100 mg by mouth daily. , Disp: , Rfl:  .  montelukast (SINGULAIR) 10 MG tablet, Take 1 tablet by mouth each evening for coughing or wheezing., Disp: 30 tablet, Rfl: 5 .  pantoprazole (PROTONIX) 40 MG tablet, Take 1 tablet (40 mg total) by mouth daily., Disp: 30 tablet, Rfl: 5 .  rivaroxaban (XARELTO) 20 MG TABS tablet,  Take 20 mg by mouth daily with supper. , Disp: , Rfl:  .  Tiotropium Bromide Monohydrate (SPIRIVA RESPIMAT) 1.25 MCG/ACT AERS, Inhale 2 puffs into the lungs daily., Disp: 4 g, Rfl: 3   No Known Allergies   Review of Systems  Constitutional: Negative.   Respiratory: Negative.   Cardiovascular: Negative.   Gastrointestinal: Negative.   Neurological: Negative.   Psychiatric/Behavioral: Negative.      Today's Vitals   08/10/19 1149  BP: 124/78  Pulse: (!) 58  Temp: 98.9 F (37.2 C)  TempSrc: Oral  SpO2: 97%   There is no height or weight on file to calculate BMI.   Objective:  Physical Exam Vitals signs and nursing note reviewed.  Constitutional:      Appearance: Normal appearance. She is obese.  HENT:     Head: Normocephalic and atraumatic.  Cardiovascular:     Rate and Rhythm: Normal rate and regular rhythm.     Heart sounds: Normal heart sounds.  Pulmonary:     Effort: Pulmonary effort is normal.     Breath sounds: Normal breath sounds.  Skin:    General: Skin is warm.  Neurological:     General: No focal deficit present.     Mental Status: She is alert.  Psychiatric:        Mood and Affect: Mood normal.        Behavior: Behavior normal.         Assessment And Plan:     1. Paroxysmal atrial fibrillation (HCC)  DISCHARGE SUMMARY WAS REVIEWED IN FULL DETAIL DURING THE VISIT. MEDS RECONCILED AND COMPARED TO DISCHARGE MEDS. MEDICATION LIST WAS UPDATED AND REVIEWED WITH THE PATIENT. GREATER THAN 50% FACE TO FACE TIME WAS SPENT IN COUNSELING AND COORDINATION OF CARE. ALL QUESTIONS WERE ANSWERED TO THE SATISFACTION OF THE PATIENT.   Chronic, rate controlled. She is encouraged to comply with medications, including anticoagulant. She is encouraged to take magnesium nightly. I will check magnesium level today.   - Magnesium  2. Hypertensive heart disease without heart failure  Chronic, well controlled. She will continue with current meds. She is encouraged to avoid  adding salt to her foods.   3. Flu vaccine need  She was given flu vaccine to update her immunization history.   [name], MD    THE PATIENT IS ENCOURAGED TO PRACTICE SOCIAL DISTANCING DUE TO THE COVID-19 PANDEMIC.

## 2019-08-11 ENCOUNTER — Encounter: Payer: Self-pay | Admitting: Internal Medicine

## 2019-08-11 LAB — MAGNESIUM: Magnesium: 2 mg/dL (ref 1.6–2.3)

## 2019-08-12 ENCOUNTER — Encounter: Payer: Self-pay | Admitting: Internal Medicine

## 2019-08-14 ENCOUNTER — Encounter: Payer: Self-pay | Admitting: Internal Medicine

## 2019-08-16 ENCOUNTER — Telehealth: Payer: Self-pay

## 2019-08-16 MED ORDER — LINACLOTIDE 145 MCG PO CAPS: ORAL_CAPSULE | ORAL | 1 refills | Status: DC

## 2019-08-16 NOTE — Telephone Encounter (Signed)
The pt was asked if she requested the linzess dose of 145 mcg and the pt said yes because that dose works for her.

## 2019-08-23 ENCOUNTER — Encounter: Payer: Self-pay | Admitting: Internal Medicine

## 2019-08-23 ENCOUNTER — Ambulatory Visit: Payer: 59 | Admitting: Internal Medicine

## 2019-08-23 ENCOUNTER — Other Ambulatory Visit: Payer: Self-pay

## 2019-08-23 MED ORDER — LINACLOTIDE 290 MCG PO CAPS: ORAL_CAPSULE | ORAL | 2 refills | Status: DC

## 2019-08-27 ENCOUNTER — Other Ambulatory Visit: Payer: Self-pay | Admitting: Allergy and Immunology

## 2019-08-27 DIAGNOSIS — J45901 Unspecified asthma with (acute) exacerbation: Secondary | ICD-10-CM

## 2019-08-27 DIAGNOSIS — H1010 Acute atopic conjunctivitis, unspecified eye: Secondary | ICD-10-CM

## 2019-08-27 DIAGNOSIS — J309 Allergic rhinitis, unspecified: Secondary | ICD-10-CM

## 2019-08-30 ENCOUNTER — Other Ambulatory Visit: Payer: Self-pay

## 2019-08-30 MED ORDER — HYDROCHLOROTHIAZIDE 12.5 MG PO TABS: 12.5000 mg | ORAL_TABLET | Freq: Every day | ORAL | 4 refills | Status: DC

## 2019-09-06 ENCOUNTER — Telehealth: Payer: Self-pay

## 2019-09-06 NOTE — Telephone Encounter (Signed)
I returned the pt's call and left a message that Dr. Baird Cancer said no that the pt's doesn't need to increase her fluid pill for the pt to avoid salt, keep her legs elevated and if it persist for the pt to contact her heart Dr.

## 2019-09-08 ENCOUNTER — Ambulatory Visit: Payer: 59 | Admitting: Internal Medicine

## 2019-09-08 NOTE — Progress Notes (Deleted)
NEED TO IRON DOWN WHAT INHALERS SHE IS TAKING    Synopsis: Referred in Oct 2020 for abnormal spirometry by Dorothyann PengSanders, Robyn, MD  Subjective:   PATIENT ID: Tracey Escobar GENDER: female DOB: 11/04/1970, MRN: [id number]  No chief complaint on file.   HPI Here for potentially asthma.  TH2 labs negative.  History as below.  48 year old woman with longstanding allergies and asthma since childhood presenting for persistently abnormal spirometries.  She has been having worsening DOE and asthma control over past year but there is thought this was actually her Afib acting up.  She recently stopped her LABA and started LAMA, also placed on xopenex instead of ventolin, these changes have improved her symptoms dramatically.  Her last spirometry from allergy clinic just shows restriction. She is on a variety of sinus and allergy pills/sprays from allergy clinic and used to be on allergy shots, has plans to restart this soon.  She also has a knee replacement scheduled at some point once she is cleared from a respiratory and cardiac standpoint.  At this time it seems the knee pain is now the primary inhibiting factor in her ability to exercise.  Regarding exposures, she was on amiodarone for a year or two in past 5 years. She is a never smoker.  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    -2018 Eos 100 -IgE 2018 7 -PSG 2019 mild OSA AHI 9  Regimen includes astelin, flonase sprays, singulair, xyzal, spiriva and PRN xopenex  Last visit ordered full PFTs and cleared for her knee surgery  Seems like she went to ER 08/10/19 for Afib/RVR.  Echo looked fine.  Trops were fine.  Rx for amiodarone in addition to BB.   Past Medical History:  Diagnosis Date  . Arthritis   . Asthma   . Atrial fibrillation (HCC)   . Dysrhythmia    A-Fib  . GERD (gastroesophageal reflux disease)   . Hypertension   . Irritable bowel   . Pneumonia   . Sleep apnea   . Sleep apnea   . Vitamin D  deficiency      Family History  Problem Relation Age of Onset  . Hyperlipidemia Mother   . Hypertension Mother   . Hyperlipidemia Father   . Hypertension Father   . COPD Father   . Heart failure Father   . Diabetes Brother   . Hypertension Maternal Grandmother   . Seizures Maternal Grandmother   . Cancer Paternal Grandfather   . Cancer Brother   . Allergic rhinitis Neg Hx   . Angioedema Neg Hx   . Asthma Neg Hx   . Eczema Neg Hx   . Immunodeficiency Neg Hx   . Urticaria Neg Hx      Past Surgical History:  Procedure Laterality Date  . COLONOSCOPY    . DILITATION & CURRETTAGE/HYSTROSCOPY WITH NOVASURE ABLATION N/A 10/06/2018   Procedure: DILATATION & CURETTAGE/HYSTEROSCOPY WITH NOVASURE ABLATION;  Surgeon: Kirkland HunStringer, Arthur, MD;  Location: [facility];  Service: Gynecology;  Laterality: N/A;  . ENDOMETRIAL ABLATION    . EXCISION MASS UPPER EXTREMETIES Right 02/25/2016   Procedure: EXCISION RIGHT UPPER ARM MASS;  Surgeon: Abigail Miyamotoouglas [name], MD;  Location: [facility] OR;  Service: General;  Laterality: Right;    Social History   Socioeconomic History  . Marital status: Single    Spouse name: Not on file  . Number of children: Not on file  . Years of education: Not on file  .  Highest education level: Not on file  Occupational History  . Not on file  Social Needs  . Financial resource strain: Not on file  . Food insecurity    Worry: Not on file    Inability: Not on file  . Transportation needs    Medical: Not on file    Non-medical: Not on file  Tobacco Use  . Smoking status: Never Smoker  . Smokeless tobacco: Never Used  Substance and Sexual Activity  . Alcohol use: Yes    Alcohol/week: 1.0 standard drinks    Types: 1 Glasses of wine per week    Comment: rare use  . Drug use: No  . Sexual activity: Never  Lifestyle  . Physical activity    Days per week: Not on file    Minutes per session: Not on file  . Stress: Not on file  Relationships  . Social Musician on  phone: Not on file    Gets together: Not on file    Attends religious service: Not on file    Active member of club or organization: Not on file    Attends meetings of clubs or organizations: Not on file    Relationship status: Not on file  . Intimate partner violence    Fear of current or ex partner: Not on file    Emotionally abused: Not on file    Physically abused: Not on file    Forced sexual activity: Not on file  Other Topics Concern  . Not on file  Social History Narrative  . Not on file     No Known Allergies   Outpatient Medications Prior to Visit  Medication Sig Dispense Refill  . amiodarone (PACERONE) 200 MG tablet Take 200 mg by mouth daily.    Marland Kitchen azelastine (ASTELIN) 0.1 % nasal spray Place 1 spray into both nostrils 2 (two) times daily. 30 mL 5  . calcium carbonate (OS-CAL - DOSED IN MG OF ELEMENTAL CALCIUM) 1250 (500 Ca) MG tablet Take 1 tablet by mouth daily with breakfast.    . Cholecalciferol (VITAMIN D3) 5000 UNITS TABS Take 1 tablet by mouth daily.    . cycloSPORINE (RESTASIS) 0.05 % ophthalmic emulsion Place 1 drop into both eyes 2 (two) times daily.    Marland Kitchen EPINEPHrine 0.3 mg/0.3 mL IJ SOAJ injection Use as directed for anaphylaxis 2 Device 1  . fluticasone (FLONASE) 50 MCG/ACT nasal spray 2 sprays per nostril once daily for stuffy nose. 16 g 5  . fluticasone (FLOVENT HFA) 220 MCG/ACT inhaler Inhale 2 puffs into the lungs 2 (two) times daily. 1 Inhaler 5  . hydrochlorothiazide (HYDRODIURIL) 12.5 MG tablet Take 1 tablet (12.5 mg total) by mouth daily. 30 tablet 4  . levalbuterol (XOPENEX HFA) 45 MCG/ACT inhaler Inhale 2 puffs into the lungs every 4 (four) hours as needed for wheezing. 1 Inhaler 5  . levocetirizine (XYZAL) 5 MG tablet TAKE 1 TABLET BY MOUTH EVERY DAY IN THE EVENING 90 tablet 1  . linaclotide (LINZESS) 290 MCG CAPS capsule TAKE 1 CAPSULE BY MOUTH EVERY DAY ON AN EMPTY STOMACH AT LEAST 30 MINUTES BEFORE FIRST MEAL OF THE DAY 30 capsule 2  . losartan  (COZAAR) 25 MG tablet Take 1 tablet (25 mg total) by mouth daily. 90 tablet 0  . Magnesium 250 MG TABS Take 250 mg by mouth daily.     . medroxyPROGESTERone (DEPO-PROVERA) 150 MG/ML injection Inject 150 mg into the muscle every 3 (three) months.    Marland Kitchen  metoprolol succinate (TOPROL-XL) 100 MG 24 hr tablet Take 100 mg by mouth daily.     . montelukast (SINGULAIR) 10 MG tablet TAKE 1 TABLET BY MOUTH EVERY EVENING FOR COUGHING OR WHEEZING 30 tablet 1  . pantoprazole (PROTONIX) 40 MG tablet Take 1 tablet (40 mg total) by mouth daily. 90 tablet 1  . rivaroxaban (XARELTO) 20 MG TABS tablet Take 20 mg by mouth daily with supper.     . Tiotropium Bromide Monohydrate (SPIRIVA RESPIMAT) 1.25 MCG/ACT AERS Inhale 2 puffs into the lungs daily. 4 g 3   No facility-administered medications prior to visit.      Positive Symptoms in bold:  Constitutional fevers, chills, weight loss, fatigue, anorexia, malaise  Eyes decreased vision, double vision, eye irritation  Ears, Nose, Mouth, Throat sore throat, trouble swallowing, sinus congestion  Cardiovascular chest pain, paroxysmal nocturnal dyspnea, lower ext edema, palpitations   Respiratory SOB, cough, DOE, hemoptysis, wheezing  Gastrointestinal nausea, vomiting, diarrhea  Genitourinary burning with urination, trouble urinating  Musculoskeletal joint aches, joint swelling, back pain  Integumentary  rashes, skin lesions  Neurological focal weakness, focal numbness, trouble speaking, headaches  Psychiatric depression, anxiety, confusion  Endocrine polyuria, polydipsia, cold intolerance, heat intolerance  Hematologic abnormal bruising, abnormal bleeding, unexplained nose bleeds  Allergic/Immunologic recurrent infections, hives, swollen lymph nodes      Objective:  GEN: middle aged woman in NAD HEENT: [place] 3, MMM CV: RRR, ext warm PULM: completely clear, no accessory muscle use GI: Soft, +BS EXT: no edema NEURO: normal ambulation, moves all 4 ext  to command PSYCH: AOx3, good insight SKIN: no rashes    There were no vitals filed for this visit.   on RA BMI Readings from Last 3 Encounters:  08/10/19 44.13 kg/m  07/14/19 46.77 kg/m  06/21/19 45.48 kg/m   Wt Readings from Last 3 Encounters:  08/10/19 295 lb 6.4 oz (134 kg)  07/14/19 298 lb 9.6 oz (135.4 kg)  06/21/19 290 lb 6.4 oz (131.7 kg)     CBC    Component Value Date/Time   WBC 7.3 03/09/2019 1653   WBC 5.9 09/27/2018 1059   RBC 4.59 03/09/2019 1653   RBC 4.88 09/27/2018 1059   HGB 13.1 03/09/2019 1653   HCT 39.6 03/09/2019 1653   PLT 362 03/09/2019 1653   MCV 86 03/09/2019 1653   MCH 28.5 03/09/2019 1653   MCH 27.0 09/27/2018 1059   MCHC 33.1 03/09/2019 1653   MCHC 30.9 09/27/2018 1059   RDW 13.0 03/09/2019 1653   LYMPHSABS 3.0 05/11/2017 1623   EOSABS 0.1 05/11/2017 1623   BASOSABS 0.0 05/11/2017 1623     Chest Imaging: None  Pulmonary Functions Testing Results: No flowsheet data found.  FeNO: None  Pathology: None  Echocardiogram: None   Heart Catheterization: NA    Assessment & Plan:  # Questionable moderate persistent asthma I guess but IgE and eos have been negative, would be a neutrophil driven process which is tough to treat unless it is all environment driven. # Abnormal spirometry I suspect related to inadequate expiratory times, will need to repeat # GERD on PPI # Afib on BB, amiodarone and AC # Sinusitis/rhinitis # DJD needing knee surgery to get back to exercising    Discussion: - Continue astelin, flonase sprays, singulair, xyzal - Continue spiriva and PRN xopenex - Full PFTs, consider ILD scan if DLCO corrected to [facility] reduced - She needs no further testing from my standpoint prior to knee surgery; getting her exercising again will  be great benefit - At some point she may benefit from an MCT so we can consider cutting down on her meds - f/u after full PFTs   Current Outpatient Medications:  .  amiodarone (PACERONE)  200 MG tablet, Take 200 mg by mouth daily., Disp: , Rfl:  .  azelastine (ASTELIN) 0.1 % nasal spray, Place 1 spray into both nostrils 2 (two) times daily., Disp: 30 mL, Rfl: 5 .  calcium carbonate (OS-CAL - DOSED IN MG OF ELEMENTAL CALCIUM) 1250 (500 Ca) MG tablet, Take 1 tablet by mouth daily with breakfast., Disp: , Rfl:  .  Cholecalciferol (VITAMIN D3) 5000 UNITS TABS, Take 1 tablet by mouth daily., Disp: , Rfl:  .  cycloSPORINE (RESTASIS) 0.05 % ophthalmic emulsion, Place 1 drop into both eyes 2 (two) times daily., Disp: , Rfl:  .  EPINEPHrine 0.3 mg/0.3 mL IJ SOAJ injection, Use as directed for anaphylaxis, Disp: 2 Device, Rfl: 1 .  fluticasone (FLONASE) 50 MCG/ACT nasal spray, 2 sprays per nostril once daily for stuffy nose., Disp: 16 g, Rfl: 5 .  fluticasone (FLOVENT HFA) 220 MCG/ACT inhaler, Inhale 2 puffs into the lungs 2 (two) times daily., Disp: 1 Inhaler, Rfl: 5 .  hydrochlorothiazide (HYDRODIURIL) 12.5 MG tablet, Take 1 tablet (12.5 mg total) by mouth daily., Disp: 30 tablet, Rfl: 4 .  levalbuterol (XOPENEX HFA) 45 MCG/ACT inhaler, Inhale 2 puffs into the lungs every 4 (four) hours as needed for wheezing., Disp: 1 Inhaler, Rfl: 5 .  levocetirizine (XYZAL) 5 MG tablet, TAKE 1 TABLET BY MOUTH EVERY DAY IN THE EVENING, Disp: 90 tablet, Rfl: 1 .  linaclotide (LINZESS) 290 MCG CAPS capsule, TAKE 1 CAPSULE BY MOUTH EVERY DAY ON AN EMPTY STOMACH AT LEAST 30 MINUTES BEFORE FIRST MEAL OF THE DAY, Disp: 30 capsule, Rfl: 2 .  losartan (COZAAR) 25 MG tablet, Take 1 tablet (25 mg total) by mouth daily., Disp: 90 tablet, Rfl: 0 .  Magnesium 250 MG TABS, Take 250 mg by mouth daily. , Disp: , Rfl:  .  medroxyPROGESTERone (DEPO-PROVERA) 150 MG/ML injection, Inject 150 mg into the muscle every 3 (three) months., Disp: , Rfl:  .  metoprolol succinate (TOPROL-XL) 100 MG 24 hr tablet, Take 100 mg by mouth daily. , Disp: , Rfl:  .  montelukast (SINGULAIR) 10 MG tablet, TAKE 1 TABLET BY MOUTH EVERY EVENING FOR  COUGHING OR WHEEZING, Disp: 30 tablet, Rfl: 1 .  pantoprazole (PROTONIX) 40 MG tablet, Take 1 tablet (40 mg total) by mouth daily., Disp: 90 tablet, Rfl: 1 .  rivaroxaban (XARELTO) 20 MG TABS tablet, Take 20 mg by mouth daily with supper. , Disp: , Rfl:  .  Tiotropium Bromide Monohydrate (SPIRIVA RESPIMAT) 1.25 MCG/ACT AERS, Inhale 2 puffs into the lungs daily., Disp: 4 g, Rfl: [place], MD [facility] 09/08/2019 8:24 AM

## 2019-09-25 ENCOUNTER — Other Ambulatory Visit: Payer: Self-pay

## 2019-09-25 MED ORDER — ALBUTEROL SULFATE HFA 108 (90 BASE) MCG/ACT IN AERS: 2.0000 | INHALATION_SPRAY | RESPIRATORY_TRACT | 1 refills | Status: DC | PRN

## 2019-09-26 ENCOUNTER — Other Ambulatory Visit: Payer: Self-pay

## 2019-09-26 MED ORDER — HYDROCHLOROTHIAZIDE 12.5 MG PO TABS: 12.5000 mg | ORAL_TABLET | Freq: Every day | ORAL | 1 refills | Status: DC

## 2019-10-19 ENCOUNTER — Other Ambulatory Visit: Payer: Self-pay

## 2019-10-25 ENCOUNTER — Other Ambulatory Visit: Payer: Self-pay

## 2019-10-25 DIAGNOSIS — J309 Allergic rhinitis, unspecified: Secondary | ICD-10-CM

## 2019-10-25 DIAGNOSIS — J45901 Unspecified asthma with (acute) exacerbation: Secondary | ICD-10-CM

## 2019-10-25 DIAGNOSIS — H1010 Acute atopic conjunctivitis, unspecified eye: Secondary | ICD-10-CM

## 2019-10-25 NOTE — Telephone Encounter (Addendum)
Spiriva and montelukast denied at [facility]. Patient needs an OV

## 2019-10-25 NOTE — Addendum Note (Signed)
Addended by: Virl Son D on: 10/25/2019 04:54 PM   Modules accepted: Orders

## 2019-10-26 ENCOUNTER — Other Ambulatory Visit: Payer: Self-pay

## 2019-10-26 DIAGNOSIS — J45901 Unspecified asthma with (acute) exacerbation: Secondary | ICD-10-CM

## 2019-10-26 DIAGNOSIS — J309 Allergic rhinitis, unspecified: Secondary | ICD-10-CM

## 2019-10-26 DIAGNOSIS — H1010 Acute atopic conjunctivitis, unspecified eye: Secondary | ICD-10-CM

## 2019-10-27 ENCOUNTER — Other Ambulatory Visit: Payer: Self-pay

## 2019-10-27 DIAGNOSIS — J309 Allergic rhinitis, unspecified: Secondary | ICD-10-CM

## 2019-10-27 DIAGNOSIS — H1010 Acute atopic conjunctivitis, unspecified eye: Secondary | ICD-10-CM

## 2019-10-27 DIAGNOSIS — J45901 Unspecified asthma with (acute) exacerbation: Secondary | ICD-10-CM

## 2019-10-28 ENCOUNTER — Other Ambulatory Visit (HOSPITAL_COMMUNITY): Payer: 59

## 2019-11-01 ENCOUNTER — Encounter: Payer: Self-pay | Admitting: Internal Medicine

## 2019-11-01 ENCOUNTER — Other Ambulatory Visit: Payer: Self-pay

## 2019-11-01 ENCOUNTER — Telehealth (INDEPENDENT_AMBULATORY_CARE_PROVIDER_SITE_OTHER): Payer: 59 | Admitting: Internal Medicine

## 2019-11-01 VITALS — HR 80 | Temp 96.9°F | Ht 68.6 in

## 2019-11-01 DIAGNOSIS — J0110 Acute frontal sinusitis, unspecified: Secondary | ICD-10-CM | POA: Diagnosis not present

## 2019-11-01 DIAGNOSIS — I480 Paroxysmal atrial fibrillation: Secondary | ICD-10-CM | POA: Diagnosis not present

## 2019-11-01 DIAGNOSIS — Z6841 Body Mass Index (BMI) 40.0 and over, adult: Secondary | ICD-10-CM

## 2019-11-01 DIAGNOSIS — I119 Hypertensive heart disease without heart failure: Secondary | ICD-10-CM | POA: Diagnosis not present

## 2019-11-01 MED ORDER — AMOXICILLIN-POT CLAVULANATE 875-125 MG PO TABS: 1.0000 | ORAL_TABLET | Freq: Two times a day (BID) | ORAL | 0 refills | Status: AC

## 2019-11-01 NOTE — Patient Instructions (Addendum)
COVID-19 Vaccine Information can be found at: ShippingScam.co.uk For questions related to vaccine distribution or appointments, please email [email or web address] or call [phone].     Sinusitis, Adult Sinusitis is soreness and swelling (inflammation) of your sinuses. Sinuses are hollow spaces in the bones around your face. They are located:  Around your eyes.  In the middle of your forehead.  Behind your nose.  In your cheekbones. Your sinuses and nasal passages are lined with a fluid called mucus. Mucus drains out of your sinuses. Swelling can trap mucus in your sinuses. This lets germs (bacteria, virus, or fungus) grow, which leads to infection. Most of the time, this condition is caused by a virus. What are the causes? This condition is caused by:  Allergies.  Asthma.  Germs.  Things that block your nose or sinuses.  Growths in the nose (nasal polyps).  Chemicals or irritants in the air.  Fungus (rare). What increases the risk? You are more likely to develop this condition if:  You have a weak body defense system (immune system).  You do a lot of swimming or diving.  You use nasal sprays too much.  You smoke. What are the signs or symptoms? The main symptoms of this condition are pain and a feeling of pressure around the sinuses. Other symptoms include:  Stuffy nose (congestion).  Runny nose (drainage).  Swelling and warmth in the sinuses.  Headache.  Toothache.  A cough that may get worse at night.  Mucus that collects in the throat or the back of the nose (postnasal drip).  Being unable to smell and taste.  Being very tired (fatigue).  A fever.  Sore throat.  Bad breath. How is this diagnosed? This condition is diagnosed based on:  Your symptoms.  Your medical history.  A physical exam.  Tests to find out if your condition is short-term (acute) or long-term (chronic). Your  doctor may: ? Check your nose for growths (polyps). ? Check your sinuses using a tool that has a light (endoscope). ? Check for allergies or germs. ? Do imaging tests, such as an MRI or CT scan. How is this treated? Treatment for this condition depends on the cause and whether it is short-term or long-term.  If caused by a virus, your symptoms should go away on their own within 10 days. You may be given medicines to relieve symptoms. They include: ? Medicines that shrink swollen tissue in the nose. ? Medicines that treat allergies (antihistamines). ? A spray that treats swelling of the nostrils. ? Rinses that help get rid of thick mucus in your nose (nasal saline washes).  If caused by bacteria, your doctor may wait to see if you will get better without treatment. You may be given antibiotic medicine if you have: ? A very bad infection. ? A weak body defense system.  If caused by growths in the nose, you may need to have surgery. Follow these instructions at home: Medicines  Take, use, or apply over-the-counter and prescription medicines only as told by your doctor. These may include nasal sprays.  If you were prescribed an antibiotic medicine, take it as told by your doctor. Do not stop taking the antibiotic even if you start to feel better. Hydrate and humidify   Drink enough water to keep your pee (urine) pale yellow.  Use a cool mist humidifier to keep the humidity level in your home above 50%.  Breathe in steam for 10-15 minutes, 3-4 times a day, or as told by  your doctor. You can do this in the bathroom while a hot shower is running.  Try not to spend time in cool or dry air. Rest  Rest as much as you can.  Sleep with your head raised (elevated).  Make sure you get enough sleep each night. General instructions   Put a warm, moist washcloth on your face 3-4 times a day, or as often as told by your doctor. This will help with discomfort.  Wash your hands often with  soap and water. If there is no soap and water, use hand sanitizer.  Do not smoke. Avoid being around people who are smoking (secondhand smoke).  Keep all follow-up visits as told by your doctor. This is important. Contact a doctor if:  You have a fever.  Your symptoms get worse.  Your symptoms do not get better within 10 days. Get help right away if:  You have a very bad headache.  You cannot stop throwing up (vomiting).  You have very bad pain or swelling around your face or eyes.  You have trouble seeing.  You feel confused.  Your neck is stiff.  You have trouble breathing. Summary  Sinusitis is swelling of your sinuses. Sinuses are hollow spaces in the bones around your face.  This condition is caused by tissues in your nose that become inflamed or swollen. This traps germs. These can lead to infection.  If you were prescribed an antibiotic medicine, take it as told by your doctor. Do not stop taking it even if you start to feel better.  Keep all follow-up visits as told by your doctor. This is important. This information is not intended to replace advice given to you by your health care provider. Make sure you discuss any questions you have with your health care provider. Document Revised: 02/21/2018 Document Reviewed: 02/21/2018 Elsevier Patient Education  2020 [employer].

## 2019-11-01 NOTE — Progress Notes (Signed)
Virtual Visit via Video   This visit type was conducted due to national recommendations for restrictions regarding the COVID-19 Pandemic (e.g. social distancing) in an effort to limit this patient's exposure and mitigate transmission in our community.  Due to her co-morbid illnesses, this patient is at least at moderate risk for complications without adequate follow up.  This format is felt to be most appropriate for this patient at this time.  All issues noted in this document were discussed and addressed.  A limited physical exam was performed with this format.    This visit type was conducted due to national recommendations for restrictions regarding the COVID-19 Pandemic (e.g. social distancing) in an effort to limit this patient's exposure and mitigate transmission in our community.  Patients identity confirmed using two different identifiers.  This format is felt to be most appropriate for this patient at this time.  All issues noted in this document were discussed and addressed.  No physical exam was performed (except for noted visual exam findings with Video Visits).    Date:  11/01/2019   ID:  Tracey Escobar, DOB [birth year], MRN [id number]  Patient Location:  Home  Provider location:   Office    Chief Complaint:  "I think I have a sinus infection"  History of Present Illness:    Tracey Escobar is a 49 y.o. female who presents via video conferencing for a telehealth visit today.    The patient does have symptoms concerning for COVID-19 infection (fever, chills, cough, or new shortness of breath).   She presents today for virtual visit. She prefers this method of contact due to COVID-19 pandemic.  She presents today for further evaluation of possible sinus infection. She c/o sinus congestion, facial pain and ear pain. She denies fever/chills. She had COVID testing performed last week. She gets this done at work on a regular basis. Her sx are similar to those she experienced w/  previous sinus infections. She sometimes sees blood in her mucus when she blows her nose.     Past Medical History:  Diagnosis Date  . Arthritis   . Asthma   . Atrial fibrillation ([place])   . Dysrhythmia    A-Fib  . GERD (gastroesophageal reflux disease)   . Hypertension   . Irritable bowel   . Pneumonia   . Sleep apnea   . Sleep apnea   . Vitamin D deficiency    Past Surgical History:  Procedure Laterality Date  . COLONOSCOPY    . [place] N/A 10/06/2018   Procedure: DILATATION & CURETTAGE/HYSTEROSCOPY WITH NOVASURE ABLATION;  Surgeon: Ena Dawley, MD;  Location: [facility];  Service: Gynecology;  Laterality: N/A;  . ENDOMETRIAL ABLATION    . EXCISION MASS UPPER EXTREMETIES Right 02/25/2016   Procedure: EXCISION RIGHT UPPER ARM MASS;  Surgeon: Coralie Keens, MD;  Location: [place];  Service: General;  Laterality: Right;     Current Meds  Medication Sig  . albuterol (VENTOLIN HFA) 108 (90 Base) MCG/ACT inhaler Inhale 2 puffs into the lungs every 4 (four) hours as needed for wheezing or shortness of breath.  Marland Kitchen amiodarone (PACERONE) 200 MG tablet Take 200 mg by mouth daily.  . calcium carbonate (OS-CAL - DOSED IN MG OF ELEMENTAL CALCIUM) 1250 (500 Ca) MG tablet Take 1 tablet by mouth daily with breakfast.  . Cholecalciferol (VITAMIN D3) 5000 UNITS TABS Take 1 tablet by mouth daily.  . cycloSPORINE (RESTASIS) 0.05 % ophthalmic emulsion Place 1  drop into both eyes 2 (two) times daily.  Marland Kitchen EPINEPHrine 0.3 mg/0.3 mL IJ SOAJ injection Use as directed for anaphylaxis  . fluticasone (FLONASE) 50 MCG/ACT nasal spray 2 sprays per nostril once daily for stuffy nose.  . fluticasone (FLOVENT HFA) 220 MCG/ACT inhaler Inhale 2 puffs into the lungs 2 (two) times daily.  . hydrochlorothiazide (HYDRODIURIL) 12.5 MG tablet Take 1 tablet (12.5 mg total) by mouth daily.  Marland Kitchen levalbuterol (XOPENEX HFA) 45 MCG/ACT inhaler Inhale 2 puffs into the lungs every  4 (four) hours as needed for wheezing.  Marland Kitchen levocetirizine (XYZAL) 5 MG tablet TAKE 1 TABLET BY MOUTH EVERY DAY IN THE EVENING  . linaclotide (LINZESS) 290 MCG CAPS capsule TAKE 1 CAPSULE BY MOUTH EVERY DAY ON AN EMPTY STOMACH AT LEAST 30 MINUTES BEFORE FIRST MEAL OF THE DAY  . losartan (COZAAR) 25 MG tablet Take 1 tablet (25 mg total) by mouth daily.  . Magnesium 250 MG TABS Take 250 mg by mouth daily.   . medroxyPROGESTERone (DEPO-PROVERA) 150 MG/ML injection Inject 150 mg into the muscle every 3 (three) months.  . metoprolol succinate (TOPROL-XL) 100 MG 24 hr tablet Take 100 mg by mouth daily.   . montelukast (SINGULAIR) 10 MG tablet TAKE 1 TABLET BY MOUTH EVERY EVENING FOR COUGHING OR WHEEZING  . pantoprazole (PROTONIX) 40 MG tablet Take 1 tablet (40 mg total) by mouth daily.  . rivaroxaban (XARELTO) 20 MG TABS tablet Take 20 mg by mouth daily with supper.   . Tiotropium Bromide Monohydrate (SPIRIVA RESPIMAT) 1.25 MCG/ACT AERS Inhale 2 puffs into the lungs daily.     Allergies:   Patient has no known allergies.   Social History   Tobacco Use  . Smoking status: Never Smoker  . Smokeless tobacco: Never Used  Substance Use Topics  . Alcohol use: Yes    Alcohol/week: 1.0 standard drinks    Types: 1 Glasses of wine per week    Comment: rare use  . Drug use: No     Family Hx: The patient's family history includes COPD in her father; Cancer in her brother and paternal grandfather; Diabetes in her brother; Heart failure in her father; Hyperlipidemia in her father and mother; Hypertension in her father, maternal grandmother, and mother; Seizures in her maternal grandmother. There is no history of Allergic rhinitis, Angioedema, Asthma, Eczema, Immunodeficiency, or Urticaria.  ROS:   Please see the history of present illness.    Review of Systems  Constitutional: Negative.   HENT: Positive for ear pain and sinus pain.   Respiratory: Negative.   Cardiovascular: Negative.     Gastrointestinal: Negative.   Neurological: Negative.   Psychiatric/Behavioral: Negative.     All other systems reviewed and are negative.   Labs/Other Tests and Data Reviewed:    Recent Labs: 03/09/2019: ALT 13; Hemoglobin 13.1; Platelets 362 03/16/2019: BUN 8; Creatinine, Ser 0.78; Potassium 3.6; Sodium 144 08/10/2019: Magnesium 2.0   Recent Lipid Panel Lab Results  Component Value Date/Time   CHOL 129 03/09/2019 04:53 PM   TRIG 67 03/09/2019 04:53 PM   HDL 56 03/09/2019 04:53 PM   CHOLHDL 2.3 03/09/2019 04:53 PM   LDLCALC 60 03/09/2019 04:53 PM    Wt Readings from Last 3 Encounters:  08/10/19 295 lb 6.4 oz (134 kg)  07/14/19 298 lb 9.6 oz (135.4 kg)  06/21/19 290 lb 6.4 oz (131.7 kg)     Exam:    Vital Signs:  Pulse 80   Temp (!) 96.9 F (36.1 C)  Ht 5' 8.6" (1.742 m)   BMI 44.13 kg/m     Physical Exam  Constitutional: She is oriented to person, place, and time and well-developed, well-nourished, and in no distress.  HENT:  Head: Normocephalic and atraumatic.  She sounds congested.  Pulmonary/Chest: Effort normal.  Musculoskeletal:     Cervical back: Normal range of motion.  Neurological: She is alert and oriented to person, place, and time.  Psychiatric: Affect normal.  Nursing note and vitals reviewed.   ASSESSMENT & PLAN:    1. Acute non-recurrent frontal sinusitis  She was given rx Augmentin 875mg  twice daily. She is encouraged to take full abx course and to avoid dairy while she is ill. She will let me know if her sx persist.   2. Hypertensive heart disease without heart failure  Chronic, yet stable. She will continue with current meds and encouraged to avoid adding salt to her foods.   3. Paroxysmal atrial fibrillation (HCC)  Chronic, she is now on flecainide instead of amiodarone. Cardiology notes from [facility] reviewed in full detail.   4. Class 3 severe obesity due to excess calories with serious comorbidity and body mass index (BMI)  of 40.0 to 44.9 in adult (HCC)  BMI 44. She is now under the care of [facility]. She is excited about this new program b/c it also offers personal training. She reports that her insurance does not cover Saxenda, but she is not sure if PA performed. Pt advised I will see if I can get medication approved for her. She has no family history of thyroid cancer.      COVID-19 Education: The signs and symptoms of COVID-19 were discussed with the patient and how to seek care for testing (follow up with PCP or arrange E-visit).  The importance of social distancing was discussed today.  Patient Risk:   After full review of this patients clinical status, I feel that they are at least moderate risk at this time.    Medication Adjustments/Labs and Tests Ordered: Current medicines are reviewed at length with the patient today.  Concerns regarding medicines are outlined above.   Tests Ordered: No orders of the defined types were placed in this encounter.   Medication Changes: Meds ordered this encounter  Medications  . amoxicillin-clavulanate (AUGMENTIN) 875-125 MG tablet    Sig: Take 1 tablet by mouth 2 (two) times daily for 10 days.    Dispense:  20 tablet    Refill:  0    Disposition:  Follow up prn  Signed, [facility], MD

## 2019-11-17 ENCOUNTER — Other Ambulatory Visit: Payer: Self-pay | Admitting: Allergy and Immunology

## 2019-11-17 DIAGNOSIS — H1010 Acute atopic conjunctivitis, unspecified eye: Secondary | ICD-10-CM

## 2019-11-17 DIAGNOSIS — J45901 Unspecified asthma with (acute) exacerbation: Secondary | ICD-10-CM

## 2019-11-17 DIAGNOSIS — J309 Allergic rhinitis, unspecified: Secondary | ICD-10-CM

## 2019-11-20 ENCOUNTER — Other Ambulatory Visit: Payer: Self-pay

## 2019-11-20 DIAGNOSIS — J329 Chronic sinusitis, unspecified: Secondary | ICD-10-CM

## 2019-11-23 ENCOUNTER — Other Ambulatory Visit: Payer: Self-pay | Admitting: Internal Medicine

## 2019-11-23 NOTE — Telephone Encounter (Signed)
Linzess refill 

## 2019-11-29 ENCOUNTER — Encounter (INDEPENDENT_AMBULATORY_CARE_PROVIDER_SITE_OTHER): Payer: Self-pay | Admitting: Otolaryngology

## 2019-11-29 ENCOUNTER — Other Ambulatory Visit: Payer: Self-pay

## 2019-11-29 ENCOUNTER — Other Ambulatory Visit (INDEPENDENT_AMBULATORY_CARE_PROVIDER_SITE_OTHER): Payer: Self-pay

## 2019-11-29 ENCOUNTER — Ambulatory Visit (INDEPENDENT_AMBULATORY_CARE_PROVIDER_SITE_OTHER): Payer: 59 | Admitting: Otolaryngology

## 2019-11-29 VITALS — Temp 97.9°F

## 2019-11-29 DIAGNOSIS — J310 Chronic rhinitis: Secondary | ICD-10-CM | POA: Diagnosis not present

## 2019-11-29 DIAGNOSIS — J329 Chronic sinusitis, unspecified: Secondary | ICD-10-CM

## 2019-11-29 DIAGNOSIS — H6123 Impacted cerumen, bilateral: Secondary | ICD-10-CM

## 2019-11-29 DIAGNOSIS — J301 Allergic rhinitis due to pollen: Secondary | ICD-10-CM | POA: Diagnosis not present

## 2019-11-29 NOTE — Progress Notes (Addendum)
VIALS EXP 11-28-20.  ADDITIONAL LABELS NEEDED

## 2019-11-29 NOTE — Progress Notes (Signed)
HPI: Tracey Escobar is a 49 y.o. female who presents is referred by her PCP for evaluation of chronic sinus problems.  She has had a long history of allergies and chronic nasal sinus symptoms.  She has been treated for sinus infections several times in the past.  More recently describes pressure and discomfort in her head and ears.  Her right ear is especially obstructed.  She also states that her throat will get irritated.  Most of the mucus she blows out is clear or sometimes bloody.  Does not really describe that much yellow-green discharge from her nose.  She is on Flonase as well as azelastine nasal spray.  She uses the azelastine twice a day and the Flonase just in the mornings.  She describes a lot of pressure in her head as well as behind her eyes.  I had previously seen her 13 years ago for sinus problems and had an MRI scan that showed mild ethmoid sinus disease.  She always has trouble breathing through her nose.  She has obstructive sleep apnea and wears nasal CPAP at night..  Past Medical History:  Diagnosis Date  . Arthritis   . Asthma   . Atrial fibrillation ([employer])   . Dysrhythmia    A-Fib  . GERD (gastroesophageal reflux disease)   . Hypertension   . Irritable bowel   . Pneumonia   . Sleep apnea   . Sleep apnea   . Vitamin D deficiency    Past Surgical History:  Procedure Laterality Date  . COLONOSCOPY    . [place] N/A 10/06/2018   Procedure: DILATATION & CURETTAGE/HYSTEROSCOPY WITH NOVASURE ABLATION;  Surgeon: Ena Dawley, MD;  Location: [place] [facility];  Service: Gynecology;  Laterality: N/A;  . ENDOMETRIAL ABLATION    . EXCISION MASS UPPER EXTREMETIES Right 02/25/2016   Procedure: EXCISION RIGHT UPPER ARM MASS;  Surgeon: Coralie Keens, MD;  Location: [place];  Service: General;  Laterality: Right;   Social History   Socioeconomic History  . Marital status: Single    Spouse name: Not on file  . Number of children: Not  on file  . Years of education: Not on file  . Highest education level: Not on file  Occupational History  . Not on file  Tobacco Use  . Smoking status: Never Smoker  . Smokeless tobacco: Never Used  Substance and Sexual Activity  . Alcohol use: Yes    Alcohol/week: 1.0 standard drinks    Types: 1 Glasses of wine per week    Comment: rare use  . Drug use: No  . Sexual activity: Never  Other Topics Concern  . Not on file  Social History Narrative  . Not on file   Social Determinants of Health   Financial Resource Strain:   . Difficulty of Paying Living Expenses: Not on file  Food Insecurity:   . Worried About Charity fundraiser in the Last Year: Not on file  . Ran Out of Food in the Last Year: Not on file  Transportation Needs:   . Lack of Transportation (Medical): Not on file  . Lack of Transportation (Non-Medical): Not on file  Physical Activity:   . Days of Exercise per Week: Not on file  . Minutes of Exercise per Session: Not on file  Stress:   . Feeling of Stress : Not on file  Social Connections:   . Frequency of Communication with Friends and Family: Not on file  . Frequency of  Social Gatherings with Friends and Family: Not on file  . Attends Religious Services: Not on file  . Active Member of Clubs or Organizations: Not on file  . Attends Banker Meetings: Not on file  . Marital Status: Not on file   Family History  Problem Relation Age of Onset  . Hyperlipidemia Mother   . Hypertension Mother   . Hyperlipidemia Father   . Hypertension Father   . COPD Father   . Heart failure Father   . Diabetes Brother   . Hypertension Maternal Grandmother   . Seizures Maternal Grandmother   . Cancer Paternal Grandfather   . Cancer Brother   . Allergic rhinitis Neg Hx   . Angioedema Neg Hx   . Asthma Neg Hx   . Eczema Neg Hx   . Immunodeficiency Neg Hx   . Urticaria Neg Hx    No Known Allergies Prior to Admission medications   Medication Sig Start  Date End Date Taking? Authorizing Provider  albuterol (VENTOLIN HFA) 108 (90 Base) MCG/ACT inhaler Inhale 2 puffs into the lungs every 4 (four) hours as needed for wheezing or shortness of breath. 09/25/19  Yes Ambs, Norvel Richards, FNP  amiodarone (PACERONE) 200 MG tablet Take 200 mg by mouth daily.   Yes [provider]  azelastine (ASTELIN) 0.1 % nasal spray Place 1 spray into both nostrils 2 (two) times daily. 06/21/19  Yes Ambs, Norvel Richards, FNP  calcium carbonate (OS-CAL - DOSED IN MG OF ELEMENTAL CALCIUM) 1250 (500 Ca) MG tablet Take 1 tablet by mouth daily with breakfast.   Yes [provider]  Cholecalciferol (VITAMIN D3) 5000 UNITS TABS Take 1 tablet by mouth daily.   Yes [provider]  cycloSPORINE (RESTASIS) 0.05 % ophthalmic emulsion Place 1 drop into both eyes 2 (two) times daily.   Yes [provider]  EPINEPHrine 0.3 mg/0.3 mL IJ SOAJ injection Use as directed for anaphylaxis 10/19/18  Yes Bobbitt, Heywood Iles, MD  fluticasone [facility]) 50 MCG/ACT nasal spray 2 sprays per nostril once daily for stuffy nose. 06/21/19  Yes Ambs, Norvel Richards, FNP  fluticasone (FLOVENT HFA) 220 MCG/ACT inhaler Inhale 2 puffs into the lungs 2 (two) times daily. 06/21/19  Yes Ambs, Norvel Richards, FNP  hydrochlorothiazide (HYDRODIURIL) 12.5 MG tablet Take 1 tablet (12.5 mg total) by mouth daily. 09/26/19  Yes Dorothyann Peng, MD  levalbuterol [facility] HFA) 45 MCG/ACT inhaler Inhale 2 puffs into the lungs every 4 (four) hours as needed for wheezing. 06/21/19  Yes Ambs, Norvel Richards, FNP  levocetirizine (XYZAL) 5 MG tablet TAKE 1 TABLET BY MOUTH EVERY DAY IN THE EVENING 11/23/19  Yes Dorothyann Peng, MD  LINZESS 290 MCG CAPS capsule TAKE 1 CAPSULE BY MOUTH EVERY DAY 30 MINUTES BEFORE FIRST MEAL OF THE DAY ON AN EMPTY STOMACH 11/24/19  Yes Dorothyann Peng, MD  losartan (COZAAR) 25 MG tablet Take 1 tablet (25 mg total) by mouth daily. 06/13/19  Yes Dorothyann Peng, MD  Magnesium 250 MG TABS Take 250 mg by mouth daily.     Yes [provider]  medroxyPROGESTERone (DEPO-PROVERA) 150 MG/ML injection Inject 150 mg into the muscle every 3 (three) months.   Yes [provider]  metoprolol succinate (TOPROL-XL) 100 MG 24 hr tablet Take 100 mg by mouth daily.  01/11/18  Yes [provider]  montelukast (SINGULAIR) 10 MG tablet TAKE 1 TABLET BY MOUTH EVERY EVENING FOR COUGHING OR WHEEZING 11/17/19  Yes Ambs, Norvel Richards, FNP  pantoprazole (PROTONIX)  40 MG tablet Take 1 tablet (40 mg total) by mouth daily. 08/10/19  Yes Dorothyann Peng, MD  rivaroxaban (XARELTO) 20 MG TABS tablet Take 20 mg by mouth daily with supper.  12/29/16  Yes [provider]  Tiotropium Bromide Monohydrate (SPIRIVA RESPIMAT) 1.25 MCG/ACT AERS Inhale 2 puffs into the lungs daily. 06/21/19  Yes Ambs, Norvel Richards, FNP     Positive ROS: Otherwise negative  All other systems have been reviewed and were otherwise negative with the exception of those mentioned in the HPI and as above.  Physical Exam: Constitutional: Alert, well-appearing, no acute distress Ears: External ears without lesions or tenderness.  Right ear canal was totally occluded with cerumen that was removed in the office using forceps with improvement of her hearing.  The right TM is clear.  Left ear canal had mild wax buildup that was removed.  The left TM was clear.  Her hearing screening with a tuning forks this revealed symmetric hearing of both ears. Nasal: External nose without lesions. Septum with mild deformity..  Moderate rhinitis bilaterally.  After decongesting the nose nasal endoscopy was performed and nasal endoscopy she was noted to have a posterior septal deviation to the right as well as a large amount of swelling of the mucous membranes a mostly just clear mucus discharge.  The middle meatus regions were actually fairly clear with no obvious mucopurulent discharge noted.  She had moderate edema. Oral: Lips and gums without lesions. Tongue and palate  mucosa without lesions. Posterior oropharynx clear. Neck: No palpable adenopathy or masses Respiratory: Breathing comfortably  Skin: No facial/neck lesions or rash noted.  Cerumen impaction removal  Date/Time: 11/29/2019 1:00 PM Performed by: Drema Halon, MD Authorized by: Drema Halon, MD   Consent:    Consent obtained:  Verbal   Consent given by:  Patient   Risks discussed:  Pain and bleeding Procedure details:    Location:  L ear and R ear   Procedure type: curette and forceps   Post-procedure details:    Inspection:  TM intact and canal normal   Hearing quality:  Improved   Patient tolerance of procedure:  Tolerated well, no immediate complications Comments:     Right ear canal was completely occluded with cerumen with improvement of her hearing after removing the cerumen.  Both TMs were clear.  No middle ear effusion noted. Nasal/sinus endoscopy  Date/Time: 11/29/2019 1:00 PM Performed by: Drema Halon, MD Authorized by: Drema Halon, MD   Consent:    Consent obtained:  Verbal   Consent given by:  Patient   Risks discussed:  Pain Procedure details:    Indications: sino-nasal symptoms     Medication:  Afrin   Scope location: bilateral nare   Septum:    Deviation: deviated to the right and posterior deviation   Sinus:    Right middle meatus: normal     Left middle meatus: normal   Comments:     On nasal endoscopy she had moderate edema.  No polyps noted.  Both middle meatus regions were fairly clear with clear maxillary ostia bilaterally.    Assessment: Right ear cerumen impaction Chronic rhinitis with history of recurrent sinusitis.  Plan: Recommended continue with the present sprays but use the Flonase 2 sprays each nostril at night.  She will continue use the azelastine twice daily as well as saline irrigation as needed. Placed her on Augmentin 875 mg twice daily for 10 days. We will plan on obtaining a  CT scan of her  sinuses in the next couple weeks and have her follow-up.  3 to 4 weeks for recheck and review of the CT scan and to possibly discuss surgical options.   Narda Bonds, MD   CC:

## 2019-11-30 DIAGNOSIS — J3089 Other allergic rhinitis: Secondary | ICD-10-CM | POA: Diagnosis not present

## 2019-12-05 ENCOUNTER — Other Ambulatory Visit: Payer: Self-pay | Admitting: Internal Medicine

## 2019-12-05 ENCOUNTER — Ambulatory Visit (INDEPENDENT_AMBULATORY_CARE_PROVIDER_SITE_OTHER): Payer: 59 | Admitting: Internal Medicine

## 2019-12-05 ENCOUNTER — Other Ambulatory Visit: Payer: Self-pay

## 2019-12-05 ENCOUNTER — Encounter: Payer: Self-pay | Admitting: Internal Medicine

## 2019-12-05 VITALS — BP 118/82 | HR 62 | Temp 98.6°F | Ht 68.6 in | Wt 306.0 lb

## 2019-12-05 DIAGNOSIS — N6310 Unspecified lump in the right breast, unspecified quadrant: Secondary | ICD-10-CM

## 2019-12-05 DIAGNOSIS — I480 Paroxysmal atrial fibrillation: Secondary | ICD-10-CM

## 2019-12-05 DIAGNOSIS — Z0001 Encounter for general adult medical examination with abnormal findings: Secondary | ICD-10-CM

## 2019-12-05 DIAGNOSIS — I119 Hypertensive heart disease without heart failure: Secondary | ICD-10-CM | POA: Diagnosis not present

## 2019-12-05 DIAGNOSIS — Z6841 Body Mass Index (BMI) 40.0 and over, adult: Secondary | ICD-10-CM

## 2019-12-05 DIAGNOSIS — Z0000 Encounter for general adult medical examination without abnormal findings: Secondary | ICD-10-CM

## 2019-12-05 LAB — POCT URINALYSIS DIPSTICK
Bilirubin, UA: NEGATIVE
Blood, UA: NEGATIVE
Glucose, UA: NEGATIVE
Ketones, UA: NEGATIVE
Nitrite, UA: NEGATIVE
Protein, UA: NEGATIVE
Spec Grav, UA: 1.02 (ref 1.010–1.025)
Urobilinogen, UA: 0.2 E.U./dL
pH, UA: 7 (ref 5.0–8.0)

## 2019-12-05 NOTE — Progress Notes (Signed)
This visit occurred during the SARS-CoV-2 public health emergency.  Safety protocols were in place, including screening questions prior to the visit, additional usage of staff PPE, and extensive cleaning of exam room while observing appropriate contact time as indicated for disinfecting solutions.  Subjective:     Patient ID: Tracey Escobar , female    DOB: 10-Oct-1970 , 49 y.o.   MRN: [id number]   Chief Complaint  Patient presents with  . Annual Exam  . Hypertension    HPI  She is here today for a full physical examination.  She is followed by Dr. Mancel Bale for her GYN exams. She was last seen Dec 2020. She had her pap smears and mammograms performed at her last visit.   Hypertension This is a chronic problem. The current episode started more than 1 year ago. The problem has been gradually improving since onset. The problem is controlled. Pertinent negatives include no blurred vision, chest pain, palpitations or shortness of breath. Risk factors for coronary artery disease include obesity. Past treatments include lifestyle changes, angiotensin blockers and diuretics. The current treatment provides moderate improvement.     Past Medical History:  Diagnosis Date  . Arthritis   . Asthma   . Atrial fibrillation ([place])   . Dysrhythmia    A-Fib  . GERD (gastroesophageal reflux disease)   . Hypertension   . Irritable bowel   . Pneumonia   . Sleep apnea   . Sleep apnea   . Vitamin D deficiency      Family History  Problem Relation Age of Onset  . Hyperlipidemia Mother   . Hypertension Mother   . Hyperlipidemia Father   . Hypertension Father   . COPD Father   . Heart failure Father   . Diabetes Brother   . Hypertension Maternal Grandmother   . Seizures Maternal Grandmother   . Cancer Paternal Grandfather   . Cancer Brother   . Allergic rhinitis Neg Hx   . Angioedema Neg Hx   . Asthma Neg Hx   . Eczema Neg Hx   . Immunodeficiency Neg Hx   . Urticaria Neg Hx       Current Outpatient Medications:  .  azelastine (ASTELIN) 0.1 % nasal spray, Place 1 spray into both nostrils 2 (two) times daily., Disp: 30 mL, Rfl: 5 .  calcium carbonate (OS-CAL - DOSED IN MG OF ELEMENTAL CALCIUM) 1250 (500 Ca) MG tablet, Take 1 tablet by mouth daily with breakfast., Disp: , Rfl:  .  Cholecalciferol (VITAMIN D3) 5000 UNITS TABS, Take 1 tablet by mouth daily., Disp: , Rfl:  .  cycloSPORINE (RESTASIS) 0.05 % ophthalmic emulsion, Place 1 drop into both eyes 2 (two) times daily., Disp: , Rfl:  .  EPINEPHrine 0.3 mg/0.3 mL IJ SOAJ injection, Use as directed for anaphylaxis, Disp: 2 Device, Rfl: 1 .  flecainide (TAMBOCOR) 50 MG tablet, flecainide 50 mg tablet, Disp: , Rfl:  .  fluticasone (FLONASE) 50 MCG/ACT nasal spray, 2 sprays per nostril once daily for stuffy nose., Disp: 16 g, Rfl: 5 .  fluticasone (FLOVENT HFA) 220 MCG/ACT inhaler, Inhale 2 puffs into the lungs 2 (two) times daily., Disp: 1 Inhaler, Rfl: 5 .  hydrochlorothiazide (HYDRODIURIL) 12.5 MG tablet, Take 1 tablet (12.5 mg total) by mouth daily., Disp: 90 tablet, Rfl: 1 .  levalbuterol (XOPENEX HFA) 45 MCG/ACT inhaler, Inhale 2 puffs into the lungs every 4 (four) hours as needed for wheezing., Disp: 1 Inhaler, Rfl: 5 .  levocetirizine (XYZAL) 5 MG  tablet, TAKE 1 TABLET BY MOUTH EVERY DAY IN THE EVENING, Disp: 30 tablet, Rfl: 1 .  LINZESS 290 MCG CAPS capsule, TAKE 1 CAPSULE BY MOUTH EVERY DAY 30 MINUTES BEFORE FIRST MEAL OF THE DAY ON AN EMPTY STOMACH, Disp: 90 capsule, Rfl: 2 .  losartan (COZAAR) 25 MG tablet, Take 1 tablet (25 mg total) by mouth daily., Disp: 90 tablet, Rfl: 0 .  Magnesium 250 MG TABS, Take 250 mg by mouth daily. , Disp: , Rfl:  .  medroxyPROGESTERone (DEPO-PROVERA) 150 MG/ML injection, Inject 150 mg into the muscle every 3 (three) months., Disp: , Rfl:  .  metoprolol succinate (TOPROL-XL) 100 MG 24 hr tablet, Take 100 mg by mouth daily. , Disp: , Rfl:  .  montelukast (SINGULAIR) 10 MG tablet,  TAKE 1 TABLET BY MOUTH EVERY EVENING FOR COUGHING OR WHEEZING, Disp: 30 tablet, Rfl: 0 .  pantoprazole (PROTONIX) 40 MG tablet, Take 1 tablet (40 mg total) by mouth daily., Disp: 90 tablet, Rfl: 1 .  rivaroxaban (XARELTO) 20 MG TABS tablet, Take 20 mg by mouth daily with supper. , Disp: , Rfl:  .  Tiotropium Bromide Monohydrate (SPIRIVA RESPIMAT) 1.25 MCG/ACT AERS, Inhale 2 puffs into the lungs daily., Disp: 4 g, Rfl: 3 .  topiramate (TOPAMAX) 25 MG tablet, Take by mouth., Disp: , Rfl:    No Known Allergies    The patient states she uses Depo-Provera injections for birth control. Last LMP was No LMP recorded. Patient has had an injection.. Negative for Dysmenorrhea Negative for: breast discharge, breast lump(s), breast pain and breast self exam. Associated symptoms include abnormal vaginal bleeding. Pertinent negatives include abnormal bleeding (hematology), anxiety, decreased libido, depression, difficulty falling sleep, dyspareunia, history of infertility, nocturia, sexual dysfunction, sleep disturbances, urinary incontinence, urinary urgency, vaginal discharge and vaginal itching. Diet regular.The patient states her exercise level is  intermittent.   . The patient's tobacco use is:  Social History   Tobacco Use  Smoking Status Never Smoker  Smokeless Tobacco Never Used  . She has been exposed to passive smoke. The patient's alcohol use is:  Social History   Substance and Sexual Activity  Alcohol Use Yes  . Alcohol/week: 1.0 standard drinks  . Types: 1 Glasses of wine per week   Comment: rare use    Review of Systems  Constitutional: Negative.   HENT: Negative.   Eyes: Negative.  Negative for blurred vision.  Respiratory: Negative.  Negative for shortness of breath.   Cardiovascular: Negative.  Negative for chest pain and palpitations.  Endocrine: Negative.   Genitourinary: Negative.   Musculoskeletal: Negative.   Skin: Negative.   Allergic/Immunologic: Negative.    Neurological: Negative.   Hematological: Negative.   Psychiatric/Behavioral: Negative.      Today's Vitals   12/05/19 0942  BP: 118/82  Pulse: 62  Temp: 98.6 F (37 C)  TempSrc: Oral  Weight: (!) 306 lb (138.8 kg)  Height: 5' 8.6" (1.742 m)   Body mass index is 45.72 kg/m.   Objective:  Physical Exam Vitals and nursing note reviewed.  Constitutional:      Appearance: Normal appearance. She is obese.  HENT:     Head: Normocephalic and atraumatic.     Right Ear: Tympanic membrane, ear canal and external ear normal.     Left Ear: Tympanic membrane, ear canal and external ear normal.     Nose:     Comments: Deferred, masked    Mouth/Throat:     Comments: Deferred, masked Eyes:  Extraocular Movements: Extraocular movements intact.     Conjunctiva/sclera: Conjunctivae normal.     Pupils: Pupils are equal, round, and reactive to light.  Cardiovascular:     Rate and Rhythm: Normal rate and regular rhythm.     Pulses: Normal pulses.     Heart sounds: Normal heart sounds.  Pulmonary:     Effort: Pulmonary effort is normal.     Breath sounds: Normal breath sounds.  Chest:     Breasts: Tanner Score is 5.        Right: Mass present.        Left: Normal.       Comments: Area of hyperpigmented skin, mass palpable. No nipple d/c.  Abdominal:     General: Bowel sounds are normal.     Palpations: Abdomen is soft.     Comments: Obese, soft  Genitourinary:    Comments: deferred Musculoskeletal:        General: Normal range of motion.     Cervical back: Normal range of motion and neck supple.  Skin:    General: Skin is warm and dry.  Neurological:     General: No focal deficit present.     Mental Status: She is alert and oriented to person, place, and time.  Psychiatric:        Mood and Affect: Mood normal.        Behavior: Behavior normal.         Assessment And Plan:   1. Routine general medical examination at health care facility  A full exam was  performed.  Importance of monthly self breast exams was performed. PATIENT IS ADVISED TO GET 30-45 MINUTES REGULAR EXERCISE NO LESS THAN FOUR TO FIVE DAYS PER WEEK - BOTH WEIGHTBEARING EXERCISES AND AEROBIC ARE RECOMMENDED.  SHE IS ADVISED TO FOLLOW A HEALTHY DIET WITH AT LEAST SIX FRUITS/VEGGIES PER DAY, DECREASE INTAKE OF RED MEAT, AND TO INCREASE FISH INTAKE TO TWO DAYS PER WEEK.  MEATS/FISH SHOULD NOT BE FRIED, BAKED OR BROILED IS PREFERABLE.  I SUGGEST WEARING SPF 50 SUNSCREEN ON EXPOSED PARTS AND ESPECIALLY WHEN IN THE DIRECT SUNLIGHT FOR AN EXTENDED PERIOD OF TIME.  PLEASE AVOID FAST FOOD RESTAURANTS AND INCREASE YOUR WATER INTAKE.   - CMP14+EGFR - CBC - Lipid panel - Hemoglobin A1c - HIV antibody (with reflex)  2. Hypertensive heart disease without heart failure  Chronic, well controlled. She will continue with current meds.  She is encouraged to avoid adding salt to her foods. EKg performed, NSR w/o new changes noted. She will rto in six months for re-evaluation.   - POCT Urinalysis Dipstick (81002) - EKG 12-Lead  3. Paroxysmal atrial fibrillation (HCC)  Chronic. Currently in sinus rhythm. She is also followed by Cardiology.  She will continue with current meds.   4. Breast mass, right  I will refer her for right breast u/s.   5. Class 3 severe obesity due to excess calories with serious comorbidity and body mass index (BMI) of 45.0 to 49.9 in adult (HCC)  BMI 45. We discussed off-label use of Rybelsus to aid in weight loss and to address insulin resistance. She denies personal/family history of thyroid cancer.  Possible side effects were discussed with the patient. She is advised to take upon awakening with 4 oz of water, and to wait one hour prior to eating breakfast or taking other meds.    Maximino Greenland, MD    THE PATIENT IS ENCOURAGED TO PRACTICE SOCIAL DISTANCING DUE TO THE  COVID-19 PANDEMIC.

## 2019-12-06 LAB — CMP14+EGFR
ALT: 12 IU/L (ref 0–32)
AST: 10 IU/L (ref 0–40)
Albumin/Globulin Ratio: 1.4 (ref 1.2–2.2)
Albumin: 4.2 g/dL (ref 3.8–4.8)
Alkaline Phosphatase: 91 IU/L (ref 39–117)
BUN/Creatinine Ratio: 15 (ref 9–23)
BUN: 15 mg/dL (ref 6–24)
Bilirubin Total: 0.3 mg/dL (ref 0.0–1.2)
CO2: 21 mmol/L (ref 20–29)
Calcium: 10.4 mg/dL — ABNORMAL HIGH (ref 8.7–10.2)
Chloride: 107 mmol/L — ABNORMAL HIGH (ref 96–106)
Creatinine, Ser: 0.97 mg/dL (ref 0.57–1.00)
GFR calc Af Amer: 79 mL/min/{1.73_m2} (ref >59)
GFR calc non Af Amer: 69 mL/min/{1.73_m2} (ref >59)
Globulin, Total: 3.1 g/dL (ref 1.5–4.5)
Glucose: 75 mg/dL (ref 65–99)
Potassium: 4.2 mmol/L (ref 3.5–5.2)
Sodium: 142 mmol/L (ref 134–144)
Total Protein: 7.3 g/dL (ref 6.0–8.5)

## 2019-12-06 LAB — CBC
Hematocrit: 41.8 % (ref 34.0–46.6)
Hemoglobin: 13.7 g/dL (ref 11.1–15.9)
MCH: 28.9 pg (ref 26.6–33.0)
MCHC: 32.8 g/dL (ref 31.5–35.7)
MCV: 88 fL (ref 79–97)
Platelets: 328 10*3/uL (ref 150–450)
RBC: 4.74 x10E6/uL (ref 3.77–5.28)
RDW: 12.9 % (ref 11.7–15.4)
WBC: 6 10*3/uL (ref 3.4–10.8)

## 2019-12-06 LAB — LIPID PANEL
Chol/HDL Ratio: 2.8 ratio (ref 0.0–4.4)
Cholesterol, Total: 138 mg/dL (ref 100–199)
HDL: 50 mg/dL (ref >39)
LDL Chol Calc (NIH): 76 mg/dL (ref 0–99)
Triglycerides: 59 mg/dL (ref 0–149)
VLDL Cholesterol Cal: 12 mg/dL (ref 5–40)

## 2019-12-06 LAB — HEMOGLOBIN A1C
Est. average glucose Bld gHb Est-mCnc: 111 mg/dL
Hgb A1c MFr Bld: 5.5 % (ref 4.8–5.6)

## 2019-12-06 LAB — HIV ANTIBODY (ROUTINE TESTING W REFLEX): HIV Screen 4th Generation wRfx: NONREACTIVE

## 2019-12-06 LAB — INSULIN, RANDOM: INSULIN: 24.3 u[IU]/mL (ref 2.6–24.9)

## 2019-12-10 NOTE — Patient Instructions (Signed)
Health Maintenance, Female Adopting a healthy lifestyle and getting preventive care are important in promoting health and wellness. Ask your health care provider about:  The right schedule for you to have regular tests and exams.  Things you can do on your own to prevent diseases and keep yourself healthy. What should I know about diet, weight, and exercise? Eat a healthy diet   Eat a diet that includes plenty of vegetables, fruits, low-fat dairy products, and lean protein.  Do not eat a lot of foods that are high in solid fats, added sugars, or sodium. Maintain a healthy weight Body mass index (BMI) is used to identify weight problems. It estimates body fat based on height and weight. Your health care provider can help determine your BMI and help you achieve or maintain a healthy weight. Get regular exercise Get regular exercise. This is one of the most important things you can do for your health. Most adults should:  Exercise for at least 150 minutes each week. The exercise should increase your heart rate and make you sweat (moderate-intensity exercise).  Do strengthening exercises at least twice a week. This is in addition to the moderate-intensity exercise.  Spend less time sitting. Even light physical activity can be beneficial. Watch cholesterol and blood lipids Have your blood tested for lipids and cholesterol at 50 years of age, then have this test every 5 years. Have your cholesterol levels checked more often if:  Your lipid or cholesterol levels are high.  You are older than 50 years of age.  You are at high risk for heart disease. What should I know about cancer screening? Depending on your health history and family history, you may need to have cancer screening at various ages. This may include screening for:  Breast cancer.  Cervical cancer.  Colorectal cancer.  Skin cancer.  Lung cancer. What should I know about heart disease, diabetes, and high blood  pressure? Blood pressure and heart disease  High blood pressure causes heart disease and increases the risk of stroke. This is more likely to develop in people who have high blood pressure readings, are of African descent, or are overweight.  Have your blood pressure checked: ? Every 3-5 years if you are 18-39 years of age. ? Every year if you are 40 years old or older. Diabetes Have regular diabetes screenings. This checks your fasting blood sugar level. Have the screening done:  Once every three years after age 40 if you are at a normal weight and have a low risk for diabetes.  More often and at a younger age if you are overweight or have a high risk for diabetes. What should I know about preventing infection? Hepatitis B If you have a higher risk for hepatitis B, you should be screened for this virus. Talk with your health care provider to find out if you are at risk for hepatitis B infection. Hepatitis C Testing is recommended for:  Everyone born from 1945 through 1965.  Anyone with known risk factors for hepatitis C. Sexually transmitted infections (STIs)  Get screened for STIs, including gonorrhea and chlamydia, if: ? You are sexually active and are younger than 50 years of age. ? You are older than 50 years of age and your health care provider tells you that you are at risk for this type of infection. ? Your sexual activity has changed since you were last screened, and you are at increased risk for chlamydia or gonorrhea. Ask your health care provider if   you are at risk.  Ask your health care provider about whether you are at high risk for HIV. Your health care provider may recommend a prescription medicine to help prevent HIV infection. If you choose to take medicine to prevent HIV, you should first get tested for HIV. You should then be tested every 3 months for as long as you are taking the medicine. Pregnancy  If you are about to stop having your period (premenopausal) and  you may become pregnant, seek counseling before you get pregnant.  Take 400 to 800 micrograms (mcg) of folic acid every day if you become pregnant.  Ask for birth control (contraception) if you want to prevent pregnancy. Osteoporosis and menopause Osteoporosis is a disease in which the bones lose minerals and strength with aging. This can result in bone fractures. If you are 65 years old or older, or if you are at risk for osteoporosis and fractures, ask your health care provider if you should:  Be screened for bone loss.  Take a calcium or vitamin D supplement to lower your risk of fractures.  Be given hormone replacement therapy (HRT) to treat symptoms of menopause. Follow these instructions at home: Lifestyle  Do not use any products that contain nicotine or tobacco, such as cigarettes, e-cigarettes, and chewing tobacco. If you need help quitting, ask your health care provider.  Do not use street drugs.  Do not share needles.  Ask your health care provider for help if you need support or information about quitting drugs. Alcohol use  Do not drink alcohol if: ? Your health care provider tells you not to drink. ? You are pregnant, may be pregnant, or are planning to become pregnant.  If you drink alcohol: ? Limit how much you use to 0-1 drink a day. ? Limit intake if you are breastfeeding.  Be aware of how much alcohol is in your drink. In the [place] one drink equals one 12 oz bottle of beer (355 mL), one 5 oz glass of wine (148 mL), or one 1 oz glass of hard liquor (44 mL). General instructions  Schedule regular health, dental, and eye exams.  Stay current with your vaccines.  Tell your health care provider if: ? You often feel depressed. ? You have ever been abused or do not feel safe at home. Summary  Adopting a healthy lifestyle and getting preventive care are important in promoting health and wellness.  Follow your health care provider's instructions about healthy  diet, exercising, and getting tested or screened for diseases.  Follow your health care provider's instructions on monitoring your cholesterol and blood pressure. This information is not intended to replace advice given to you by your health care provider. Make sure you discuss any questions you have with your health care provider. Document Revised: 09/14/2018 Document Reviewed: 09/14/2018 Elsevier Patient Education  2020 Elsevier Inc.  

## 2019-12-15 ENCOUNTER — Other Ambulatory Visit: Payer: Self-pay

## 2019-12-15 ENCOUNTER — Ambulatory Visit
Admission: RE | Admit: 2019-12-15 | Discharge: 2019-12-15 | Disposition: A | Discharge status: Home / Self Care | Payer: 59 | Source: Ambulatory Visit | Attending: Otolaryngology | Admitting: Otolaryngology

## 2019-12-15 DIAGNOSIS — J329 Chronic sinusitis, unspecified: Secondary | ICD-10-CM

## 2019-12-20 ENCOUNTER — Other Ambulatory Visit: Payer: Self-pay

## 2019-12-20 ENCOUNTER — Ambulatory Visit (INDEPENDENT_AMBULATORY_CARE_PROVIDER_SITE_OTHER): Payer: 59

## 2019-12-20 ENCOUNTER — Ambulatory Visit (INDEPENDENT_AMBULATORY_CARE_PROVIDER_SITE_OTHER): Payer: 59 | Admitting: Otolaryngology

## 2019-12-20 VITALS — Temp 97.7°F

## 2019-12-20 DIAGNOSIS — J3089 Other allergic rhinitis: Secondary | ICD-10-CM | POA: Diagnosis not present

## 2019-12-20 DIAGNOSIS — J309 Allergic rhinitis, unspecified: Secondary | ICD-10-CM | POA: Diagnosis not present

## 2019-12-20 MED ORDER — EPINEPHRINE 0.3 MG/0.3ML IJ SOAJ: INTRAMUSCULAR | 3 refills | Status: DC

## 2019-12-20 MED ORDER — EPINEPHRINE 0.3 MG/0.3ML IJ SOAJ: 0.3000 mg | INTRAMUSCULAR | 1 refills | Status: DC | PRN

## 2019-12-20 NOTE — Progress Notes (Signed)
HPI: Tracey Escobar is a 49 y.o. female who returns today for evaluation of sinus problems.  She is followed for allergies at the allergy and asthma center in [facility].  She has been having a lot of pressure in her head that she refers to is "sinus pressure".  She underwent a CT scan of her sinuses I reviewed this with her in the office today.  This showed clear paranasal sinuses throughout with no obstruction of the ostia or air-fluid levels. She did change from using her Flonase in the morning to using it at night and this seems to help a little bit as she has more congestion at night. She just recently started allergy shots again..  Past Medical History:  Diagnosis Date  . Arthritis   . Asthma   . Atrial fibrillation ([place])   . Dysrhythmia    A-Fib  . GERD (gastroesophageal reflux disease)   . Hypertension   . Irritable bowel   . Pneumonia   . Sleep apnea   . Sleep apnea   . Vitamin D deficiency    Past Surgical History:  Procedure Laterality Date  . COLONOSCOPY    . Patrick N/A 10/06/2018   Procedure: DILATATION & CURETTAGE/HYSTEROSCOPY WITH NOVASURE ABLATION;  Surgeon: Ena Dawley, MD;  Location: [facility];  Service: Gynecology;  Laterality: N/A;  . ENDOMETRIAL ABLATION    . EXCISION MASS UPPER EXTREMETIES Right 02/25/2016   Procedure: EXCISION RIGHT UPPER ARM MASS;  Surgeon: Coralie Keens, MD;  Location: [facility];  Service: General;  Laterality: Right;   Social History   Socioeconomic History  . Marital status: Single    Spouse name: Not on file  . Number of children: Not on file  . Years of education: Not on file  . Highest education level: Not on file  Occupational History  . Not on file  Tobacco Use  . Smoking status: Never Smoker  . Smokeless tobacco: Never Used  Substance and Sexual Activity  . Alcohol use: Yes    Alcohol/week: 1.0 standard drinks    Types: 1 Glasses of wine per week    Comment: rare use  .  Drug use: No  . Sexual activity: Never  Other Topics Concern  . Not on file  Social History Narrative  . Not on file   Social Determinants of Health   Financial Resource Strain:   . Difficulty of Paying Living Expenses:   Food Insecurity:   . Worried About Charity fundraiser in the Last Year:   . Arboriculturist in the Last Year:   Transportation Needs:   . Film/video editor (Medical):   Marland Kitchen Lack of Transportation (Non-Medical):   Physical Activity:   . Days of Exercise per Week:   . Minutes of Exercise per Session:   Stress:   . Feeling of Stress :   Social Connections:   . Frequency of Communication with Friends and Family:   . Frequency of Social Gatherings with Friends and Family:   . Attends Religious Services:   . Active Member of Clubs or Organizations:   . Attends Archivist Meetings:   Marland Kitchen Marital Status:    Family History  Problem Relation Age of Onset  . Hyperlipidemia Mother   . Hypertension Mother   . Hyperlipidemia Father   . Hypertension Father   . COPD Father   . Heart failure Father   . Diabetes Brother   . Hypertension Maternal  Grandmother   . Seizures Maternal Grandmother   . Cancer Paternal Grandfather   . Cancer Brother   . Allergic rhinitis Neg Hx   . Angioedema Neg Hx   . Asthma Neg Hx   . Eczema Neg Hx   . Immunodeficiency Neg Hx   . Urticaria Neg Hx    No Known Allergies Prior to Admission medications   Medication Sig Start Date End Date Taking? Authorizing Provider  azelastine (ASTELIN) 0.1 % nasal spray Place 1 spray into both nostrils 2 (two) times daily. 06/21/19   Ambs, Norvel Richards, FNP  calcium carbonate (OS-CAL - DOSED IN MG OF ELEMENTAL CALCIUM) 1250 (500 Ca) MG tablet Take 1 tablet by mouth daily with breakfast.    [provider]  Cholecalciferol (VITAMIN D3) 5000 UNITS TABS Take 1 tablet by mouth daily.    [provider]  cycloSPORINE (RESTASIS) 0.05 % ophthalmic emulsion Place 1 drop into both eyes 2  (two) times daily.    [provider]  EPINEPHrine 0.3 mg/0.3 mL IJ SOAJ injection Use as directed for anaphylaxis 10/19/18   Bobbitt, Heywood Iles, MD  flecainide (TAMBOCOR) 50 MG tablet flecainide 50 mg tablet 09/07/19   [provider]  fluticasone (FLONASE) 50 MCG/ACT nasal spray 2 sprays per nostril once daily for stuffy nose. 06/21/19   Hetty Blend, FNP  fluticasone (FLOVENT HFA) 220 MCG/ACT inhaler Inhale 2 puffs into the lungs 2 (two) times daily. 06/21/19   Hetty Blend, FNP  hydrochlorothiazide (HYDRODIURIL) 12.5 MG tablet Take 1 tablet (12.5 mg total) by mouth daily. 09/26/19   Dorothyann Peng, MD  levalbuterol [facility] HFA) 45 MCG/ACT inhaler Inhale 2 puffs into the lungs every 4 (four) hours as needed for wheezing. 06/21/19   Ambs, Norvel Richards, FNP  levocetirizine (XYZAL) 5 MG tablet TAKE 1 TABLET BY MOUTH EVERY DAY IN THE EVENING 11/23/19   Dorothyann Peng, MD  LINZESS 290 MCG CAPS capsule TAKE 1 CAPSULE BY MOUTH EVERY DAY 30 MINUTES BEFORE FIRST MEAL OF THE DAY ON AN EMPTY STOMACH 11/24/19   Dorothyann Peng, MD  losartan (COZAAR) 25 MG tablet Take 1 tablet (25 mg total) by mouth daily. 06/13/19   Dorothyann Peng, MD  Magnesium 250 MG TABS Take 250 mg by mouth daily.     [provider]  medroxyPROGESTERone (DEPO-PROVERA) 150 MG/ML injection Inject 150 mg into the muscle every 3 (three) months.    [provider]  metoprolol succinate (TOPROL-XL) 100 MG 24 hr tablet Take 100 mg by mouth daily.  01/11/18   [provider]  montelukast (SINGULAIR) 10 MG tablet TAKE 1 TABLET BY MOUTH EVERY EVENING FOR COUGHING OR WHEEZING 11/17/19   Ambs, Norvel Richards, FNP  pantoprazole (PROTONIX) 40 MG tablet Take 1 tablet (40 mg total) by mouth daily. 08/10/19   Dorothyann Peng, MD  rivaroxaban (XARELTO) 20 MG TABS tablet Take 20 mg by mouth daily with supper.  12/29/16   [provider]  Tiotropium Bromide Monohydrate (SPIRIVA RESPIMAT) 1.25 MCG/ACT AERS Inhale 2 puffs into the  lungs daily. 06/21/19   Hetty Blend, FNP  topiramate (TOPAMAX) 25 MG tablet Take by mouth. 11/29/19   [provider]     Positive ROS: Otherwise negative  All other systems have been reviewed and were otherwise negative with the exception of those mentioned in the HPI and as above.  Physical Exam: Constitutional: Alert, well-appearing, no acute distress Ears: External ears without lesions or tenderness. Ear canals are clear bilaterally with  intact, clear TMs.  Nasal: External nose without lesions. Septum relatively midline with mild rhinitis.. Clear nasal passages with no signs of infection. Oral: Lips and gums without lesions. Tongue and palate mucosa without lesions. Posterior oropharynx clear. Neck: No palpable adenopathy or masses Respiratory: Breathing comfortably  Skin: No facial/neck lesions or rash noted.  Procedures  Assessment: Chronic allergic rhinitis  Plan: We will continue with use of nasal steroid spray especially at night and allergy medications per recommendation of the allergy and asthma center.   Narda Bonds, MD

## 2019-12-20 NOTE — Progress Notes (Signed)
Immunotherapy   Patient Details  Name: BIVIANA SADDLER MRN: [id number] Date of Birth: [birth year]  12/20/2019  [name] started injections for [employer] (DM and G-T)  Following schedule: A  Frequency:1 time per week Epi-Pen:Epi-Pen Available  Consent signed and patient instructions given. No problems after 30 minute wait.   Virl Son 12/20/2019, 4:59 PM

## 2019-12-21 ENCOUNTER — Other Ambulatory Visit: Payer: Self-pay

## 2019-12-21 ENCOUNTER — Ambulatory Visit
Admission: RE | Admit: 2019-12-21 | Discharge: 2019-12-21 | Disposition: A | Discharge status: Home / Self Care | Payer: 59 | Source: Ambulatory Visit | Attending: Internal Medicine | Admitting: Internal Medicine

## 2019-12-21 DIAGNOSIS — N6310 Unspecified lump in the right breast, unspecified quadrant: Secondary | ICD-10-CM

## 2019-12-22 ENCOUNTER — Other Ambulatory Visit: Payer: Self-pay | Admitting: Family Medicine

## 2019-12-22 ENCOUNTER — Other Ambulatory Visit: Payer: Self-pay | Admitting: Allergy and Immunology

## 2019-12-22 DIAGNOSIS — H1010 Acute atopic conjunctivitis, unspecified eye: Secondary | ICD-10-CM

## 2019-12-22 DIAGNOSIS — J45901 Unspecified asthma with (acute) exacerbation: Secondary | ICD-10-CM

## 2019-12-22 DIAGNOSIS — J309 Allergic rhinitis, unspecified: Secondary | ICD-10-CM

## 2019-12-22 NOTE — Telephone Encounter (Signed)
Denied refill montelukast and Flovent.  Patient needs OV.  Courtesy refill was given on 11/17/19.  No office visit has been made. Per office note on 06/21/2019: Return in about 2 months (around 08/21/2019), or if symptoms worsen or fail to improve.

## 2019-12-25 ENCOUNTER — Other Ambulatory Visit: Payer: Self-pay | Admitting: *Deleted

## 2019-12-25 NOTE — Telephone Encounter (Signed)
Received a refill request for lastacaft 0.25% eye drops. This medication is not on pt med list. Faxed back to [facility] denying medication. Pt needs an ov also. Last seen 06/2019.

## 2020-01-03 ENCOUNTER — Ambulatory Visit: Payer: 59 | Admitting: Allergy and Immunology

## 2020-01-03 ENCOUNTER — Ambulatory Visit (INDEPENDENT_AMBULATORY_CARE_PROVIDER_SITE_OTHER): Payer: 59 | Admitting: *Deleted

## 2020-01-03 DIAGNOSIS — J309 Allergic rhinitis, unspecified: Secondary | ICD-10-CM

## 2020-01-03 DIAGNOSIS — J45901 Unspecified asthma with (acute) exacerbation: Secondary | ICD-10-CM

## 2020-01-03 DIAGNOSIS — H1010 Acute atopic conjunctivitis, unspecified eye: Secondary | ICD-10-CM

## 2020-01-03 MED ORDER — MONTELUKAST SODIUM 10 MG PO TABS: 10.0000 mg | ORAL_TABLET | Freq: Every day | ORAL | 1 refills | Status: DC

## 2020-01-03 MED ORDER — AZELASTINE HCL 0.1 % NA SOLN: 1.0000 | Freq: Two times a day (BID) | NASAL | 1 refills | Status: DC

## 2020-01-03 MED ORDER — FLUTICASONE PROPIONATE 50 MCG/ACT NA SUSP: NASAL | 1 refills | Status: DC

## 2020-01-03 MED ORDER — SPIRIVA RESPIMAT 1.25 MCG/ACT IN AERS: 2.0000 | INHALATION_SPRAY | Freq: Every day | RESPIRATORY_TRACT | 1 refills | Status: DC

## 2020-01-03 MED ORDER — FLOVENT HFA 220 MCG/ACT IN AERO: 2.0000 | INHALATION_SPRAY | Freq: Two times a day (BID) | RESPIRATORY_TRACT | 1 refills | Status: DC

## 2020-01-03 MED ORDER — LEVOCETIRIZINE DIHYDROCHLORIDE 5 MG PO TABS: 2.5000 mg | ORAL_TABLET | Freq: Every evening | ORAL | 1 refills | Status: DC

## 2020-01-09 ENCOUNTER — Encounter: Payer: Self-pay | Admitting: Internal Medicine

## 2020-01-12 ENCOUNTER — Telehealth: Payer: Self-pay | Admitting: *Deleted

## 2020-01-12 ENCOUNTER — Ambulatory Visit (INDEPENDENT_AMBULATORY_CARE_PROVIDER_SITE_OTHER): Payer: 59 | Admitting: *Deleted

## 2020-01-12 DIAGNOSIS — J309 Allergic rhinitis, unspecified: Secondary | ICD-10-CM | POA: Diagnosis not present

## 2020-01-12 MED ORDER — LASTACAFT 0.25 % OP SOLN: 1.0000 [drp] | Freq: Every day | OPHTHALMIC | 0 refills | Status: DC | PRN

## 2020-01-12 NOTE — Telephone Encounter (Signed)
Pt requesting refill on Lastacaft eye drop. Pt states pataday does not work as well. Sent 1 courtesy refill. Pt has upcoming apt on 02/21/20.

## 2020-01-18 ENCOUNTER — Other Ambulatory Visit: Payer: Self-pay | Admitting: Internal Medicine

## 2020-01-18 ENCOUNTER — Other Ambulatory Visit: Payer: Self-pay | Admitting: *Deleted

## 2020-01-18 MED ORDER — PANTOPRAZOLE SODIUM 40 MG PO TBEC: 40.0000 mg | DELAYED_RELEASE_TABLET | Freq: Every day | ORAL | 0 refills | Status: DC

## 2020-02-01 ENCOUNTER — Other Ambulatory Visit: Payer: Self-pay | Admitting: Allergy and Immunology

## 2020-02-01 DIAGNOSIS — J45901 Unspecified asthma with (acute) exacerbation: Secondary | ICD-10-CM

## 2020-02-01 DIAGNOSIS — J309 Allergic rhinitis, unspecified: Secondary | ICD-10-CM

## 2020-02-01 DIAGNOSIS — H1010 Acute atopic conjunctivitis, unspecified eye: Secondary | ICD-10-CM

## 2020-02-02 ENCOUNTER — Ambulatory Visit (INDEPENDENT_AMBULATORY_CARE_PROVIDER_SITE_OTHER): Payer: 59

## 2020-02-02 DIAGNOSIS — J309 Allergic rhinitis, unspecified: Secondary | ICD-10-CM | POA: Diagnosis not present

## 2020-02-15 ENCOUNTER — Other Ambulatory Visit: Payer: Self-pay | Admitting: Internal Medicine

## 2020-02-16 ENCOUNTER — Ambulatory Visit (INDEPENDENT_AMBULATORY_CARE_PROVIDER_SITE_OTHER): Payer: 59

## 2020-02-16 DIAGNOSIS — J309 Allergic rhinitis, unspecified: Secondary | ICD-10-CM

## 2020-02-21 ENCOUNTER — Encounter: Payer: Self-pay | Admitting: Allergy and Immunology

## 2020-02-21 ENCOUNTER — Ambulatory Visit (INDEPENDENT_AMBULATORY_CARE_PROVIDER_SITE_OTHER): Payer: 59 | Admitting: Allergy and Immunology

## 2020-02-21 ENCOUNTER — Other Ambulatory Visit: Payer: Self-pay

## 2020-02-21 VITALS — BP 122/76 | HR 88 | Temp 98.1°F | Resp 16 | Ht 67.6 in | Wt 306.4 lb

## 2020-02-21 DIAGNOSIS — H1013 Acute atopic conjunctivitis, bilateral: Secondary | ICD-10-CM | POA: Diagnosis not present

## 2020-02-21 DIAGNOSIS — J4540 Moderate persistent asthma, uncomplicated: Secondary | ICD-10-CM | POA: Diagnosis not present

## 2020-02-21 DIAGNOSIS — J309 Allergic rhinitis, unspecified: Secondary | ICD-10-CM

## 2020-02-21 DIAGNOSIS — J3089 Other allergic rhinitis: Secondary | ICD-10-CM

## 2020-02-21 DIAGNOSIS — K219 Gastro-esophageal reflux disease without esophagitis: Secondary | ICD-10-CM

## 2020-02-21 MED ORDER — FLOVENT HFA 220 MCG/ACT IN AERO: 2.0000 | INHALATION_SPRAY | Freq: Two times a day (BID) | RESPIRATORY_TRACT | 0 refills | Status: DC

## 2020-02-21 NOTE — Assessment & Plan Note (Deleted)
   Continue appropriate allergen avoidance measures and immunotherapy injections per protocol.  Continue fluticasone nasal spray, 1 to 2 sprays per nostril daily as needed and nasal saline rinse as needed.

## 2020-02-21 NOTE — Assessment & Plan Note (Signed)
   Treatment plan as outlined above for allergic rhinitis.  Continue Pataday, one drop per eye daily if needed. 

## 2020-02-21 NOTE — Patient Instructions (Addendum)
Moderate persistent asthma Well-controlled, we will stepdown therapy at this time. Hold Spiriva.  If lower respiratory symptoms progress in frequency and/or severity, the patient is to resume the previous dose.  For now, continue Flovent 220 g, 2 inhalations via spacer device twice daily.  Continue Xopenex HFA, 1 to 2 inhalations every 4-6 hours if needed.  May also use Xopenex 10 to 15 minutes prior to exercise.  Subjective and objective measures of pulmonary function will be followed and the treatment plan will be adjusted accordingly.  Allergic rhinitis  Continue appropriate allergen avoidance measures and immunotherapy injections per protocol.  Continue fluticasone nasal spray, 1 to 2 sprays per nostril daily as needed and nasal saline rinse as needed.  Allergic conjunctivitis  Treatment plan as outlined above for allergic rhinitis.  Continue Pataday, one drop per eye daily if needed.  Gastroesophageal reflux disease  Continue appropriate reflux lifestyle modifications and Protonix as prescribed.   Return in about 3 months (around 05/23/2020), or if symptoms worsen or fail to improve.

## 2020-02-21 NOTE — Assessment & Plan Note (Signed)
   Continue appropriate reflux lifestyle modifications and Protonix as prescribed.

## 2020-02-21 NOTE — Assessment & Plan Note (Addendum)
   Continue appropriate allergen avoidance measures and immunotherapy injections per protocol.  Continue fluticasone nasal spray, 1 to 2 sprays per nostril daily as needed and nasal saline rinse as needed. 

## 2020-02-21 NOTE — Progress Notes (Signed)
Follow-up Note  RE: Tracey Escobar MRN: [id number] DOB: 1970/10/15 Date of Office Visit: 02/21/2020  Primary care provider: Dorothyann Peng, MD Referring provider: Dorothyann Peng, MD  History of present illness: Tracey Escobar is a 49 y.o. female with persistent asthma, allergic rhinoconjunctivitis, and reflux presenting today for follow-up.  She was last seen in this clinic in September 2020.  Regarding asthma, she is currently taking Flovent 220 g, 2 inhalations via spacer device twice daily, and Spiriva, 2 inhalations daily.  While on this regimen, she rarely requires albuterol rescue and denies limitations in normal daily activities or nocturnal awakenings due to lower respiratory symptoms.  She reports that her nasal allergy symptoms have improved while on immunotherapy and she has been receiving the injections without problems or complications.  She experiences rare nasal congestion at nighttime.  She uses fluticasone nasal spray daily and nasal saline rinse as needed.  She has not required Mucinex recently.  She rarely experiences ocular symptoms, which she treats with Pataday eyedrops.  She reports that her acid reflux is well controlled with Protonix.  Assessment and plan: Moderate persistent asthma Well-controlled, we will stepdown therapy at this time. Hold Spiriva.  If lower respiratory symptoms progress in frequency and/or severity, the patient is to resume the previous dose.  For now, continue Flovent 220 g, 2 inhalations via spacer device twice daily.  Continue Xopenex HFA, 1 to 2 inhalations every 4-6 hours if needed.  May also use Xopenex 10 to 15 minutes prior to exercise.  Subjective and objective measures of pulmonary function will be followed and the treatment plan will be adjusted accordingly.  Allergic rhinitis  Continue appropriate allergen avoidance measures and immunotherapy injections per protocol.  Continue fluticasone nasal spray, 1 to 2 sprays per  nostril daily as needed and nasal saline rinse as needed.  Allergic conjunctivitis  Treatment plan as outlined above for allergic rhinitis.  Continue Pataday, one drop per eye daily if needed.  Gastroesophageal reflux disease  Continue appropriate reflux lifestyle modifications and Protonix as prescribed.   Meds ordered this encounter  Medications  . fluticasone (FLOVENT HFA) 220 MCG/ACT inhaler    Sig: Inhale 2 puffs into the lungs 2 (two) times daily.    Dispense:  12 g    Refill:  0    This is a courtesy refill. Patient needs an OV for further refills.    Diagnostics: Spirometry reveals an FVC of 1.98 L and an FEV1 of 1.59 L (58% predicted) with an FEV1 ratio of 98%.  Moderate restriction which is consistent with previous studies and may be due to body habitus.  Please see scanned spirometry results for details.     Physical examination: Blood pressure 122/76, pulse 88, temperature 98.1 F (36.7 C), temperature source Oral, resp. rate 16, height 5' 7.6" (1.717 m), weight (!) 306 lb 6.4 oz (139 kg), SpO2 100 %.  General: Alert, interactive, in no acute distress. HEENT: TMs pearly gray, turbinates minimally edematous without discharge, post-pharynx mildly erythematous. Neck: Supple without lymphadenopathy. Lungs: Clear to auscultation without wheezing, rhonchi or rales. CV: Normal S1, S2 without murmurs. Skin: Warm and dry, without lesions or rashes.  The following portions of the patient's history were reviewed and updated as appropriate: allergies, current medications, past family history, past medical history, past social history, past surgical history and problem list.  Current Outpatient Medications  Medication Sig Dispense Refill  . Alcaftadine (LASTACAFT) 0.25 % SOLN Place 1 drop into both eyes daily as needed.  3 mL 0  . azelastine (ASTELIN) 0.1 % nasal spray Place 1 spray into both nostrils 2 (two) times daily. 30 mL 1  . Cholecalciferol (VITAMIN D3) 5000 UNITS  TABS Take 1 tablet by mouth daily.    Marland Kitchen EPINEPHrine (EPIPEN 2-PAK) 0.3 mg/0.3 mL IJ SOAJ injection Use as directed for severe allergic reactions 2 each 3  . flecainide (TAMBOCOR) 50 MG tablet flecainide 50 mg tablet    . fluticasone (FLONASE) 50 MCG/ACT nasal spray USE 2 SPRAYS IN EACH NOSTRIL ONCE DAILY FOR STUFFY NOSE 16 g 0  . fluticasone (FLOVENT HFA) 220 MCG/ACT inhaler Inhale 2 puffs into the lungs 2 (two) times daily. 12 g 0  . levalbuterol (XOPENEX HFA) 45 MCG/ACT inhaler Inhale 2 puffs into the lungs every 4 (four) hours as needed for wheezing. 1 Inhaler 5  . levocetirizine (XYZAL) 5 MG tablet TAKE 1 TABLET BY MOUTH EVERY DAY IN THE EVENING 90 tablet 2  . LINZESS 290 MCG CAPS capsule TAKE 1 CAPSULE BY MOUTH EVERY DAY 30 MINUTES BEFORE FIRST MEAL OF THE DAY ON AN EMPTY STOMACH 90 capsule 2  . losartan (COZAAR) 25 MG tablet Take 1 tablet (25 mg total) by mouth daily. 90 tablet 0  . Magnesium 250 MG TABS Take 250 mg by mouth daily.     . medroxyPROGESTERone (DEPO-PROVERA) 150 MG/ML injection Inject 150 mg into the muscle every 3 (three) months.    . metoprolol succinate (TOPROL-XL) 100 MG 24 hr tablet Take 100 mg by mouth daily.     . montelukast (SINGULAIR) 10 MG tablet TAKE 1 TABLET(10 MG) BY MOUTH AT BEDTIME 30 tablet 0  . pantoprazole (PROTONIX) 40 MG tablet TAKE 1 TABLET(40 MG) BY MOUTH DAILY 90 tablet 0  . SPIRIVA RESPIMAT 1.25 MCG/ACT AERS INHALE 2 PUFFS INTO THE LUNGS DAILY 4 g 0   No current facility-administered medications for this visit.    No Known Allergies  Review of systems: Review of systems negative except as noted in HPI / PMHx.  Past Medical History:  Diagnosis Date  . Arthritis   . Asthma   . Atrial fibrillation ([place])   . Dysrhythmia    A-Fib  . GERD (gastroesophageal reflux disease)   . Hypertension   . Irritable bowel   . Pneumonia   . Sleep apnea   . Sleep apnea   . Vitamin D deficiency     Family History  Problem Relation Age of Onset  .  Hyperlipidemia Mother   . Hypertension Mother   . Hyperlipidemia Father   . Hypertension Father   . COPD Father   . Heart failure Father   . Diabetes Brother   . Hypertension Maternal Grandmother   . Seizures Maternal Grandmother   . Cancer Paternal Grandfather   . Cancer Brother   . Allergic rhinitis Neg Hx   . Angioedema Neg Hx   . Asthma Neg Hx   . Eczema Neg Hx   . Immunodeficiency Neg Hx   . Urticaria Neg Hx     Social History   Socioeconomic History  . Marital status: Single    Spouse name: Not on file  . Number of children: Not on file  . Years of education: Not on file  . Highest education level: Not on file  Occupational History  . Not on file  Tobacco Use  . Smoking status: Never Smoker  . Smokeless tobacco: Never Used  Substance and Sexual Activity  . Alcohol use: Yes  Alcohol/week: 1.0 standard drinks    Types: 1 Glasses of wine per week    Comment: rare use  . Drug use: No  . Sexual activity: Never  Other Topics Concern  . Not on file  Social History Narrative  . Not on file   Social Determinants of Health   Financial Resource Strain:   . Difficulty of Paying Living Expenses:   Food Insecurity:   . Worried About Programme researcher, broadcasting/film/video in the Last Year:   . Barista in the Last Year:   Transportation Needs:   . Freight forwarder (Medical):   Marland Kitchen Lack of Transportation (Non-Medical):   Physical Activity:   . Days of Exercise per Week:   . Minutes of Exercise per Session:   Stress:   . Feeling of Stress :   Social Connections:   . Frequency of Communication with Friends and Family:   . Frequency of Social Gatherings with Friends and Family:   . Attends Religious Services:   . Active Member of Clubs or Organizations:   . Attends Banker Meetings:   Marland Kitchen Marital Status:   Intimate Partner Violence:   . Fear of Current or Ex-Partner:   . Emotionally Abused:   Marland Kitchen Physically Abused:   . Sexually Abused:     I appreciate  the opportunity to take part in Sarena's care. Please do not hesitate to contact me with questions.  Sincerely,   R. Jorene Guest, MD

## 2020-02-21 NOTE — Assessment & Plan Note (Addendum)
Well-controlled, we will stepdown therapy at this time. Hold Spiriva.  If lower respiratory symptoms progress in frequency and/or severity, the patient is to resume the previous dose.  For now, continue Flovent 220 g, 2 inhalations via spacer device twice daily.  Continue Xopenex HFA, 1 to 2 inhalations every 4-6 hours if needed.  May also use Xopenex 10 to 15 minutes prior to exercise.  Subjective and objective measures of pulmonary function will be followed and the treatment plan will be adjusted accordingly.

## 2020-03-04 ENCOUNTER — Other Ambulatory Visit: Payer: Self-pay | Admitting: Internal Medicine

## 2020-03-13 ENCOUNTER — Ambulatory Visit: Payer: Self-pay | Admitting: *Deleted

## 2020-03-18 ENCOUNTER — Other Ambulatory Visit: Payer: Self-pay | Admitting: Internal Medicine

## 2020-04-01 ENCOUNTER — Encounter (INDEPENDENT_AMBULATORY_CARE_PROVIDER_SITE_OTHER): Payer: Self-pay

## 2020-04-02 ENCOUNTER — Ambulatory Visit: Payer: 59 | Admitting: Internal Medicine

## 2020-04-29 ENCOUNTER — Ambulatory Visit: Payer: 59 | Admitting: Nurse Practitioner

## 2020-04-29 ENCOUNTER — Ambulatory Visit (INDEPENDENT_AMBULATORY_CARE_PROVIDER_SITE_OTHER): Payer: 59 | Admitting: Bariatrics

## 2020-05-13 ENCOUNTER — Ambulatory Visit (INDEPENDENT_AMBULATORY_CARE_PROVIDER_SITE_OTHER): Payer: 59 | Admitting: Bariatrics

## 2020-05-20 ENCOUNTER — Other Ambulatory Visit: Payer: Self-pay | Admitting: Allergy and Immunology

## 2020-05-20 DIAGNOSIS — J45901 Unspecified asthma with (acute) exacerbation: Secondary | ICD-10-CM

## 2020-05-20 DIAGNOSIS — H1010 Acute atopic conjunctivitis, unspecified eye: Secondary | ICD-10-CM

## 2020-07-02 ENCOUNTER — Ambulatory Visit: Payer: Self-pay | Admitting: Internal Medicine

## 2020-07-04 ENCOUNTER — Other Ambulatory Visit: Payer: Self-pay | Admitting: Internal Medicine

## 2020-07-08 ENCOUNTER — Other Ambulatory Visit: Payer: Self-pay | Admitting: Internal Medicine

## 2020-07-08 ENCOUNTER — Other Ambulatory Visit: Payer: Self-pay | Admitting: Obstetrics and Gynecology

## 2020-07-08 DIAGNOSIS — Z1231 Encounter for screening mammogram for malignant neoplasm of breast: Secondary | ICD-10-CM

## 2020-08-12 ENCOUNTER — Ambulatory Visit: Payer: 59

## 2020-09-18 ENCOUNTER — Other Ambulatory Visit: Payer: Self-pay | Admitting: Allergy and Immunology

## 2020-09-18 DIAGNOSIS — J45901 Unspecified asthma with (acute) exacerbation: Secondary | ICD-10-CM

## 2020-09-18 DIAGNOSIS — H1010 Acute atopic conjunctivitis, unspecified eye: Secondary | ICD-10-CM

## 2020-09-19 ENCOUNTER — Inpatient Hospital Stay: Admission: RE | Admit: 2020-09-19 | Payer: 59 | Source: Ambulatory Visit

## 2020-09-19 ENCOUNTER — Other Ambulatory Visit: Payer: Self-pay | Admitting: Internal Medicine

## 2020-10-01 LAB — HM MAMMOGRAPHY

## 2020-10-02 ENCOUNTER — Encounter: Payer: Self-pay | Admitting: Internal Medicine

## 2020-10-08 ENCOUNTER — Other Ambulatory Visit: Payer: Self-pay | Admitting: Allergy and Immunology

## 2020-10-08 NOTE — Telephone Encounter (Signed)
Courtesy refill sent, needs to keep appointment in January 2022

## 2020-10-16 ENCOUNTER — Ambulatory Visit: Payer: 59 | Admitting: Family Medicine

## 2020-10-17 ENCOUNTER — Ambulatory Visit (INDEPENDENT_AMBULATORY_CARE_PROVIDER_SITE_OTHER): Payer: 59 | Admitting: Nurse Practitioner

## 2020-10-17 ENCOUNTER — Other Ambulatory Visit: Payer: Self-pay

## 2020-10-17 ENCOUNTER — Other Ambulatory Visit: Payer: Self-pay | Admitting: Internal Medicine

## 2020-10-17 ENCOUNTER — Encounter: Payer: Self-pay | Admitting: Nurse Practitioner

## 2020-10-17 VITALS — BP 132/82 | HR 88 | Ht 67.6 in | Wt 300.0 lb

## 2020-10-17 DIAGNOSIS — Z20822 Contact with and (suspected) exposure to covid-19: Secondary | ICD-10-CM | POA: Diagnosis not present

## 2020-10-17 DIAGNOSIS — R059 Cough, unspecified: Secondary | ICD-10-CM

## 2020-10-17 LAB — POC INFLUENZA A&B (BINAX/QUICKVUE)
Influenza A, POC: NEGATIVE
Influenza B, POC: NEGATIVE

## 2020-10-17 MED ORDER — AZITHROMYCIN 250 MG PO TABS: ORAL_TABLET | ORAL | 0 refills | Status: AC

## 2020-10-17 NOTE — Progress Notes (Signed)
I,Tianna Badgett,acting as a Neurosurgeon for [employer], NP.,have documented all relevant documentation on the behalf of [employer], NP,as directed by  Charlesetta Ivory, NP while in the presence of Charlesetta Ivory, NP.  This visit occurred during the SARS-CoV-2 public health emergency.  Safety protocols were in place, including screening questions prior to the visit, additional usage of staff PPE, and extensive cleaning of exam room while observing appropriate contact time as indicated for disinfecting solutions.  Subjective:     Patient ID: Tracey Escobar , female    DOB: [birth year] , 50 y.o.   MRN: [id number]   Chief Complaint  Patient presents with  . Cough    HPI  Patient is here for covid and flu symptoms. She would like to be tested due to cough congestion headache and chills. She was recently exposed to 3 people are confirmed positive for covid. She was tested at work still waiting for her results. Will test her again today. Her symptoms started Monday.     Past Medical History:  Diagnosis Date  . Arthritis   . Asthma   . Atrial fibrillation ([facility])   . Dysrhythmia    A-Fib  . GERD (gastroesophageal reflux disease)   . Hypertension   . Irritable bowel   . Pneumonia   . Sleep apnea   . Sleep apnea   . Vitamin D deficiency      Family History  Problem Relation Age of Onset  . Hyperlipidemia Mother   . Hypertension Mother   . Hyperlipidemia Father   . Hypertension Father   . COPD Father   . Heart failure Father   . Diabetes Brother   . Hypertension Maternal Grandmother   . Seizures Maternal Grandmother   . Cancer Paternal Grandfather   . Cancer Brother   . Allergic rhinitis Neg Hx   . Angioedema Neg Hx   . Asthma Neg Hx   . Eczema Neg Hx   . Immunodeficiency Neg Hx   . Urticaria Neg Hx      Current Outpatient Medications:  .  azithromycin (ZITHROMAX Z-PAK) 250 MG tablet, Take 2 tablets (500 mg) on  Day 1,  followed by 1 tablet (250 mg) once  daily on Days 2 through 5., Disp: 6 each, Rfl: 0 .  azelastine (ASTELIN) 0.1 % nasal spray, Place 1 spray into both nostrils 2 (two) times daily., Disp: 30 mL, Rfl: 1 .  Cholecalciferol (VITAMIN D3) 5000 UNITS TABS, Take 1 tablet by mouth daily., Disp: , Rfl:  .  EPINEPHrine (EPIPEN 2-PAK) 0.3 mg/0.3 mL IJ SOAJ injection, Use as directed for severe allergic reactions, Disp: 2 each, Rfl: 3 .  flecainide (TAMBOCOR) 50 MG tablet, flecainide 50 mg tablet, Disp: , Rfl:  .  fluticasone (FLONASE) 50 MCG/ACT nasal spray, USE 2 SPRAYS IN EACH NOSTRIL ONCE DAILY FOR STUFFY NOSE, Disp: 16 g, Rfl: 0 .  fluticasone (FLOVENT HFA) 220 MCG/ACT inhaler, Inhale 2 puffs into the lungs 2 (two) times daily., Disp: 12 g, Rfl: 0 .  hydrochlorothiazide (HYDRODIURIL) 12.5 MG tablet, TAKE 1 TABLET(12.5 MG) BY MOUTH DAILY, Disp: 90 tablet, Rfl: 0 .  LASTACAFT 0.25 % SOLN, INSTILL 1 DROP IN BOTH EYES DAILY AS NEEDED, Disp: 3 mL, Rfl: 0 .  levalbuterol (XOPENEX HFA) 45 MCG/ACT inhaler, Inhale 2 puffs into the lungs every 4 (four) hours as needed for wheezing., Disp: 1 Inhaler, Rfl: 5 .  levocetirizine (XYZAL) 5 MG tablet, TAKE 1 TABLET BY MOUTH EVERY DAY IN THE [place],  Disp: 90 tablet, Rfl: 2 .  LINZESS 145 MCG CAPS capsule, TAKE 1 CAPSULE BY MOUTH EVERY DAY 30 MINUTES BEFORE FIRST MEAL OF THE DAY ON AN EMPTY STOMACH, Disp: 90 capsule, Rfl: 1 .  LINZESS 290 MCG CAPS capsule, TAKE 1 CAPSULE BY MOUTH EVERY DAY 30 MINUTES BEFORE FIRST MEAL OF THE DAY ON AN EMPTY STOMACH, Disp: 90 capsule, Rfl: 2 .  losartan (COZAAR) 25 MG tablet, TAKE 1 TABLET(25 MG) BY MOUTH DAILY, Disp: 90 tablet, Rfl: 0 .  losartan (COZAAR) 25 MG tablet, TAKE 1 TABLET(25 MG) BY MOUTH DAILY, Disp: 90 tablet, Rfl: 0 .  Magnesium 250 MG TABS, Take 250 mg by mouth daily. , Disp: , Rfl:  .  medroxyPROGESTERone (DEPO-PROVERA) 150 MG/ML injection, Inject 150 mg into the muscle every 3 (three) months., Disp: , Rfl:  .  metoprolol succinate (TOPROL-XL) 100 MG 24 hr  tablet, Take 100 mg by mouth daily. , Disp: , Rfl:  .  montelukast (SINGULAIR) 10 MG tablet, TAKE 1 TABLET(10 MG) BY MOUTH AT BEDTIME, Disp: 30 tablet, Rfl: 2 .  pantoprazole (PROTONIX) 40 MG tablet, TAKE 1 TABLET(40 MG) BY MOUTH DAILY, Disp: 90 tablet, Rfl: 0 .  SPIRIVA RESPIMAT 1.25 MCG/ACT AERS, INHALE 2 PUFFS INTO THE LUNGS DAILY, Disp: 4 g, Rfl: 0   No Known Allergies   Review of Systems  Constitutional: Positive for chills and fatigue.  Respiratory: Positive for cough. Negative for wheezing.   Cardiovascular: Negative for chest pain.  Gastrointestinal: Negative for constipation, diarrhea and nausea.  Musculoskeletal: Positive for back pain and myalgias.     Today's Vitals   10/17/20 1157  Weight: 300 lb (136.1 kg)  Height: 5' 7.6" (1.717 m)   Body mass index is 46.16 kg/m.  Wt Readings from Last 3 Encounters:  10/17/20 300 lb (136.1 kg)  02/21/20 (!) 306 lb 6.4 oz (139 kg)  12/05/19 (!) 306 lb (138.8 kg)    Objective:  Physical Exam Constitutional:      Appearance: Normal appearance. She is obese.  HENT:     Head: Normocephalic and atraumatic.  Cardiovascular:     Rate and Rhythm: Normal rate and regular rhythm.     Pulses: Normal pulses.     Heart sounds: Normal heart sounds.  Pulmonary:     Effort: Pulmonary effort is normal. No respiratory distress.     Breath sounds: Normal breath sounds. No wheezing or rales.  Skin:    Capillary Refill: Capillary refill takes less than 2 seconds.  Neurological:     Mental Status: She is alert.        Assessment And Plan:     1. Contact with and (suspected) exposure to covid-19 -Will check for COIVD-19 -Novel Coronavirus, NAA ([facility])  - azithromycin (ZITHROMAX Z-PAK) 250 MG tablet; Take 2 tablets (500 mg) on  Day 1,  followed by 1 tablet (250 mg) once daily on Days 2 through 5.  Dispense: 6 each; Refill: 0 Advised patient to take Vitamin C, D, Zinc. Keep yourself hydrated with a lot of water and rest. Take Delsym for  cough and Mucinex. She can use Corcidin for HBP instead since she has HTN. Take Tylenol or pain reliever every 4-6 hours as needed for pain/fever/body ache. Educated patient if symptoms get worse or if she experiences any SOB or pain in her legs to seek immediate emergency care. Continue to monitor your pulse oxygen. Call us if you have any questions. Quarantine for 5 days if tested positive and  no symptoms or 10 days if tested positive and have symptoms. Wear a mask around other people.    2. Cough - Novel Coronavirus, NAA ([facility]) - POC Influenza A&B ([facility]ax test) - azithromycin (ZITHROMAX Z-PAK) 250 MG tablet; Take 2 tablets (500 mg) on  Day 1,  followed by 1 tablet (250 mg) once daily on Days 2 through 5.  Dispense: 6 each; Refill: 0  -Patient has asthma and inhaler at home. Advised her to continue to take inhaler as needed for SOB, cough and wheezing.   Patient will follow up if her symptoms do not improve or get worse.    Patient was given opportunity to ask questions. Patient verbalized understanding of the plan and was able to repeat key elements of the plan. All questions were answered to their satisfaction.  Charlesetta Ivory, NP   I, Charlesetta Ivory, NP, have reviewed all documentation for this visit. The documentation on 10/17/20 for the exam, diagnosis, procedures, and orders are all accurate and complete.  THE PATIENT IS ENCOURAGED TO PRACTICE SOCIAL DISTANCING DUE TO THE COVID-19 PANDEMIC.

## 2020-10-17 NOTE — Patient Instructions (Signed)

## 2020-10-19 LAB — SARS-COV-2, NAA 2 DAY TAT

## 2020-10-19 LAB — NOVEL CORONAVIRUS, NAA: SARS-CoV-2, NAA: NOT DETECTED

## 2020-10-20 ENCOUNTER — Encounter: Payer: Self-pay | Admitting: Internal Medicine

## 2020-10-22 ENCOUNTER — Telehealth: Payer: Self-pay

## 2020-10-22 NOTE — Telephone Encounter (Signed)
I returned the pt's call to schedule her an appt.  The pt wanted to schedule an appt with Dr. Allyne Gee for the 24th since her covid test results came back negative.

## 2020-10-28 ENCOUNTER — Ambulatory Visit: Payer: Self-pay | Admitting: Internal Medicine

## 2020-10-30 ENCOUNTER — Other Ambulatory Visit: Payer: Self-pay | Admitting: Internal Medicine

## 2020-10-30 ENCOUNTER — Other Ambulatory Visit: Payer: Self-pay

## 2020-10-30 ENCOUNTER — Encounter: Payer: Self-pay | Admitting: Internal Medicine

## 2020-10-30 ENCOUNTER — Ambulatory Visit (INDEPENDENT_AMBULATORY_CARE_PROVIDER_SITE_OTHER): Payer: 59 | Admitting: Internal Medicine

## 2020-10-30 VITALS — BP 136/84 | HR 77 | Temp 98.2°F | Ht 67.6 in | Wt 306.0 lb

## 2020-10-30 DIAGNOSIS — I119 Hypertensive heart disease without heart failure: Secondary | ICD-10-CM

## 2020-10-30 DIAGNOSIS — I480 Paroxysmal atrial fibrillation: Secondary | ICD-10-CM | POA: Diagnosis not present

## 2020-10-30 DIAGNOSIS — N644 Mastodynia: Secondary | ICD-10-CM | POA: Diagnosis not present

## 2020-10-30 DIAGNOSIS — Z1159 Encounter for screening for other viral diseases: Secondary | ICD-10-CM

## 2020-10-30 DIAGNOSIS — F5101 Primary insomnia: Secondary | ICD-10-CM | POA: Diagnosis not present

## 2020-10-30 DIAGNOSIS — Z6841 Body Mass Index (BMI) 40.0 and over, adult: Secondary | ICD-10-CM

## 2020-10-30 DIAGNOSIS — Z79899 Other long term (current) drug therapy: Secondary | ICD-10-CM | POA: Diagnosis not present

## 2020-10-30 MED ORDER — CYANOCOBALAMIN 1000 MCG/ML IJ SOLN
1000.0000 ug | Freq: Once | INTRAMUSCULAR | Status: AC
  Administered 2020-10-30: 1000 ug via INTRAMUSCULAR

## 2020-10-30 MED ORDER — WEGOVY 0.25 MG/0.5ML ~~LOC~~ SOAJ: 0.2500 mg | SUBCUTANEOUS | 0 refills | Status: DC

## 2020-10-30 NOTE — Patient Instructions (Signed)

## 2020-10-30 NOTE — Progress Notes (Signed)
Rutherford Nail as a scribe for Maximino Greenland, MD.,have documented all relevant documentation on the behalf of Maximino Greenland, MD,as directed by  Maximino Greenland, MD while in the presence of Maximino Greenland, MD. This visit occurred during the SARS-CoV-2 public health emergency.  Safety protocols were in place, including screening questions prior to the visit, additional usage of staff PPE, and extensive cleaning of exam room while observing appropriate contact time as indicated for disinfecting solutions.  Subjective:     Patient ID: Tracey Escobar , female    DOB: 25-Dec-1970 , 50 y.o.   MRN: [id number]   Chief Complaint  Patient presents with  . Hypertension    HPI  Pt here today for b/p check.  She reports compliance with meds. Denies headaches, chest pain and palpitations.  Hypertension This is a chronic problem. The current episode started more than 1 year ago. The problem has been gradually improving since onset. The problem is controlled. Pertinent negatives include no blurred vision, chest pain, headaches, palpitations or shortness of breath. Risk factors for coronary artery disease include obesity. Past treatments include lifestyle changes, angiotensin blockers and diuretics. The current treatment provides moderate improvement.     Past Medical History:  Diagnosis Date  . Arthritis   . Asthma   . Atrial fibrillation ([place])   . Dysrhythmia    A-Fib  . GERD (gastroesophageal reflux disease)   . Hypertension   . Irritable bowel   . Pneumonia   . Sleep apnea   . Sleep apnea   . Vitamin D deficiency      Family History  Problem Relation Age of Onset  . Hyperlipidemia Mother   . Hypertension Mother   . Hyperlipidemia Father   . Hypertension Father   . COPD Father   . Heart failure Father   . Diabetes Brother   . Hypertension Maternal Grandmother   . Seizures Maternal Grandmother   . Cancer Paternal Grandfather   . Cancer Brother   . Allergic rhinitis  Neg Hx   . Angioedema Neg Hx   . Asthma Neg Hx   . Eczema Neg Hx   . Immunodeficiency Neg Hx   . Urticaria Neg Hx      Current Outpatient Medications:  .  rivaroxaban (XARELTO) 20 MG TABS tablet, TAKE 1 TABLET(20 MG) BY MOUTH WITH SUPPER, Disp: , Rfl:  .  azelastine (ASTELIN) 0.1 % nasal spray, Place 1 spray into both nostrils 2 (two) times daily., Disp: 30 mL, Rfl: 1 .  Cholecalciferol (VITAMIN D3) 5000 UNITS TABS, Take 1 tablet by mouth daily., Disp: , Rfl:  .  EPINEPHrine (EPIPEN 2-PAK) 0.3 mg/0.3 mL IJ SOAJ injection, Use as directed for severe allergic reactions, Disp: 2 each, Rfl: 3 .  Estradiol 10 MCG TABS vaginal tablet, estradiol 10 mcg vaginal tablet, Disp: , Rfl:  .  flecainide (TAMBOCOR) 50 MG tablet, flecainide 50 mg tablet, Disp: , Rfl:  .  fluticasone (FLONASE) 50 MCG/ACT nasal spray, USE 2 SPRAYS IN EACH NOSTRIL ONCE DAILY FOR STUFFY NOSE, Disp: 16 g, Rfl: 0 .  fluticasone (FLOVENT HFA) 220 MCG/ACT inhaler, Inhale 2 puffs into the lungs 2 (two) times daily., Disp: 12 g, Rfl: 0 .  hydrochlorothiazide (HYDRODIURIL) 12.5 MG tablet, TAKE 1 TABLET(12.5 MG) BY MOUTH DAILY, Disp: 90 tablet, Rfl: 0 .  LASTACAFT 0.25 % SOLN, INSTILL 1 DROP IN BOTH EYES DAILY AS NEEDED, Disp: 3 mL, Rfl: 0 .  levalbuterol (XOPENEX HFA) 45 MCG/ACT inhaler,  Inhale 2 puffs into the lungs every 4 (four) hours as needed for wheezing., Disp: 1 Inhaler, Rfl: 5 .  levocetirizine (XYZAL) 5 MG tablet, TAKE 1 TABLET BY MOUTH EVERY DAY IN THE EVENING, Disp: 90 tablet, Rfl: 2 .  LINZESS 145 MCG CAPS capsule, TAKE 1 CAPSULE BY MOUTH EVERY DAY 30 MINUTES BEFORE FIRST MEAL OF THE DAY ON AN EMPTY STOMACH, Disp: 90 capsule, Rfl: 1 .  LINZESS 290 MCG CAPS capsule, TAKE 1 CAPSULE BY MOUTH EVERY DAY 30 MINUTES BEFORE FIRST MEAL OF THE DAY ON AN EMPTY STOMACH, Disp: 90 capsule, Rfl: 2 .  losartan (COZAAR) 25 MG tablet, TAKE 1 TABLET(25 MG) BY MOUTH DAILY, Disp: 90 tablet, Rfl: 0 .  losartan (COZAAR) 25 MG tablet, TAKE 1  TABLET(25 MG) BY MOUTH DAILY, Disp: 90 tablet, Rfl: 0 .  LUMIFY 0.025 % SOLN, SMARTSIG:1 Drop(s) In Eye(s) Every 12 Hours PRN, Disp: , Rfl:  .  Magnesium 250 MG TABS, Take 250 mg by mouth daily. , Disp: , Rfl:  .  medroxyPROGESTERone (DEPO-PROVERA) 150 MG/ML injection, medroxyprogesterone 150 mg/mL intramuscular suspension  Inject 1 mL every 3 months by intramuscular route around the clock., Disp: , Rfl:  .  metoprolol succinate (TOPROL-XL) 100 MG 24 hr tablet, Take 100 mg by mouth daily. , Disp: , Rfl:  .  montelukast (SINGULAIR) 10 MG tablet, TAKE 1 TABLET(10 MG) BY MOUTH AT BEDTIME, Disp: 30 tablet, Rfl: 2 .  pantoprazole (PROTONIX) 40 MG tablet, TAKE 1 TABLET(40 MG) BY MOUTH DAILY, Disp: 90 tablet, Rfl: 0 .  RESTASIS 0.05 % ophthalmic emulsion, , Disp: , Rfl:  .  SPIRIVA RESPIMAT 1.25 MCG/ACT AERS, INHALE 2 PUFFS INTO THE LUNGS DAILY, Disp: 4 g, Rfl: 0   No Known Allergies   Review of Systems  Constitutional: Positive for fatigue.       Admits she has been feeling more fatigued than usual. No change in diet. Admits she does not sleep well at night. Has issues both falling/staying asleep.   HENT: Negative.   Eyes: Negative for blurred vision.  Respiratory: Negative.  Negative for shortness of breath.   Cardiovascular: Negative.  Negative for chest pain and palpitations.  Gastrointestinal: Negative.   Endocrine: Negative for polydipsia, polyphagia and polyuria.  Musculoskeletal: Negative.   Skin: Negative.   Neurological: Negative for dizziness and headaches.  Psychiatric/Behavioral: Positive for sleep disturbance.  All other systems reviewed and are negative.    Today's Vitals   10/30/20 0930  BP: 136/84  Pulse: 77  Temp: 98.2 F (36.8 C)  TempSrc: Oral  SpO2: 98%  Weight: (!) 306 lb (138.8 kg)  Height: 5' 7.6" (1.717 m)   Body mass index is 47.08 kg/m.  Wt Readings from Last 3 Encounters:  10/30/20 (!) 306 lb (138.8 kg)  10/17/20 300 lb (136.1 kg)  02/21/20 (!) 306 lb  6.4 oz (139 kg)   Objective:  Physical Exam Vitals and nursing note reviewed.  Constitutional:      Appearance: Normal appearance. She is obese.  HENT:     Head: Normocephalic and atraumatic.  Cardiovascular:     Rate and Rhythm: Normal rate and regular rhythm.     Heart sounds: Normal heart sounds.  Pulmonary:     Breath sounds: Normal breath sounds.  Chest:  Breasts:     Tanner Score is 5.     Right: Tenderness present.      Comments: R breast - area of hyperpigmentation around 5oclock, tender to palpation. No overlying  erythema. No drainage.  Skin:    General: Skin is warm.  Neurological:     General: No focal deficit present.     Mental Status: She is alert and oriented to person, place, and time.       Assessment And Plan:     1. Hypertensive heart disease without heart failure Comments: Chronic, fair control. She is aware optimal BP is less than 130/80. She is encouraged to follow low sodium diet.  I will check renal function.  - CMP14+EGFR - Insulin, random(561)  2. Paroxysmal atrial fibrillation (HCC) Comments: She is properly anticoagulated, currently in sinus rhythm.   3. Breast tenderness in female Comments: She is scheduled for mammogram next week. I will order ultrasound and diagnostic mammo.  - [place] Unlisted Procedure Breast; Future  4. Primary insomnia Comments: She was given samples of Belsomra to take once nightly prn. If effective, I will send rx to her local pharmacy.   5. Need for hepatitis C screening test Comments: I will check HCV antibody today.  - Hepatitis C antibody  6. Class 3 severe obesity due to excess calories with serious comorbidity and body mass index (BMI) of 45.0 to 49.9 in adult [facility]) Comments: BMI 47. We discussed the use of Wegovy for obesity. She denies family/personal h/o thyroid cancer. I will send rx Mancel Parsons to her local pharmacy. She is aware this will need prior auth. Once rx is approved, she will bring to office for [facility]  teaching. She is reminded to stop eating when full while on this medication.  She will rto 4 weeks after starting the medication.  Possible side effects d/w patient in full detail.   7. Drug therapy - Vitamin B12 - cyanocobalamin ((VITAMIN B-12)) injection 1,000 mcg   Patient was given opportunity to ask questions. Patient verbalized understanding of the plan and was able to repeat key elements of the plan. All questions were answered to their satisfaction.  Maximino Greenland, MD   I, Maximino Greenland, MD, have reviewed all documentation for this visit. The documentation on 10/30/20 for the exam, diagnosis, procedures, and orders are all accurate and complete.  THE PATIENT IS ENCOURAGED TO PRACTICE SOCIAL DISTANCING DUE TO THE COVID-19 PANDEMIC.

## 2020-11-01 LAB — CMP14+EGFR
ALT: 12 IU/L (ref 0–32)
AST: 8 IU/L (ref 0–40)
Albumin/Globulin Ratio: 1.2 (ref 1.2–2.2)
Albumin: 4.1 g/dL (ref 3.8–4.8)
Alkaline Phosphatase: 112 IU/L (ref 44–121)
BUN/Creatinine Ratio: 13 (ref 9–23)
BUN: 10 mg/dL (ref 6–24)
Bilirubin Total: 0.3 mg/dL (ref 0.0–1.2)
CO2: 21 mmol/L (ref 20–29)
Calcium: 10.6 mg/dL — ABNORMAL HIGH (ref 8.7–10.2)
Chloride: 105 mmol/L (ref 96–106)
Creatinine, Ser: 0.79 mg/dL (ref 0.57–1.00)
GFR calc Af Amer: 101 mL/min/{1.73_m2} (ref >59)
GFR calc non Af Amer: 88 mL/min/{1.73_m2} (ref >59)
Globulin, Total: 3.4 g/dL (ref 1.5–4.5)
Glucose: 75 mg/dL (ref 65–99)
Potassium: 4 mmol/L (ref 3.5–5.2)
Sodium: 142 mmol/L (ref 134–144)
Total Protein: 7.5 g/dL (ref 6.0–8.5)

## 2020-11-01 LAB — INSULIN, RANDOM: INSULIN: 29.3 u[IU]/mL — ABNORMAL HIGH (ref 2.6–24.9)

## 2020-11-01 LAB — VITAMIN B12: Vitamin B-12: 569 pg/mL (ref 232–1245)

## 2020-11-01 LAB — HEPATITIS C ANTIBODY: Hep C Virus Ab: <0.1 s/co ratio (ref 0.0–0.9)

## 2020-11-04 ENCOUNTER — Encounter: Payer: Self-pay | Admitting: Internal Medicine

## 2020-11-05 ENCOUNTER — Other Ambulatory Visit: Payer: Self-pay | Admitting: Allergy and Immunology

## 2020-11-06 ENCOUNTER — Other Ambulatory Visit: Payer: Self-pay | Admitting: Internal Medicine

## 2020-11-06 DIAGNOSIS — N644 Mastodynia: Secondary | ICD-10-CM

## 2020-11-07 ENCOUNTER — Other Ambulatory Visit: Payer: Self-pay | Admitting: Internal Medicine

## 2020-11-07 ENCOUNTER — Encounter: Payer: Self-pay | Admitting: Internal Medicine

## 2020-11-07 ENCOUNTER — Ambulatory Visit: Payer: 59

## 2020-11-07 MED ORDER — BELSOMRA 20 MG PO TABS: ORAL_TABLET | ORAL | 2 refills | Status: DC

## 2020-11-12 ENCOUNTER — Other Ambulatory Visit: Payer: Self-pay

## 2020-11-12 NOTE — Telephone Encounter (Signed)
Prior auth done for [employer] waiting for patient's insurance.

## 2020-11-15 ENCOUNTER — Other Ambulatory Visit: Payer: Self-pay

## 2020-11-15 NOTE — Telephone Encounter (Signed)
Prior auth done for Belsomra  20 mg, waiting on response from the [employer].

## 2020-11-20 ENCOUNTER — Encounter: Payer: Self-pay | Admitting: Internal Medicine

## 2020-11-28 ENCOUNTER — Encounter: Payer: Self-pay | Admitting: Allergy & Immunology

## 2020-11-28 ENCOUNTER — Other Ambulatory Visit: Payer: Self-pay

## 2020-11-28 ENCOUNTER — Ambulatory Visit (INDEPENDENT_AMBULATORY_CARE_PROVIDER_SITE_OTHER): Payer: 59 | Admitting: Allergy & Immunology

## 2020-11-28 VITALS — BP 122/70 | HR 87 | Temp 97.7°F | Resp 20 | Ht 67.0 in | Wt 303.6 lb

## 2020-11-28 DIAGNOSIS — J4541 Moderate persistent asthma with (acute) exacerbation: Secondary | ICD-10-CM

## 2020-11-28 DIAGNOSIS — K219 Gastro-esophageal reflux disease without esophagitis: Secondary | ICD-10-CM | POA: Diagnosis not present

## 2020-11-28 DIAGNOSIS — J3089 Other allergic rhinitis: Secondary | ICD-10-CM

## 2020-11-28 DIAGNOSIS — J302 Other seasonal allergic rhinitis: Secondary | ICD-10-CM | POA: Diagnosis not present

## 2020-11-28 MED ORDER — AZELASTINE HCL 0.1 % NA SOLN: 1.0000 | Freq: Two times a day (BID) | NASAL | 1 refills | Status: DC

## 2020-11-28 MED ORDER — MONTELUKAST SODIUM 10 MG PO TABS: ORAL_TABLET | ORAL | 1 refills | Status: DC

## 2020-11-28 MED ORDER — PANTOPRAZOLE SODIUM 40 MG PO TBEC: DELAYED_RELEASE_TABLET | ORAL | 1 refills | Status: DC

## 2020-11-28 MED ORDER — METHYLPREDNISOLONE ACETATE 80 MG/ML IJ SUSP
80.0000 mg | Freq: Once | INTRAMUSCULAR | Status: AC
  Administered 2020-11-28: 80 mg via INTRAMUSCULAR

## 2020-11-28 MED ORDER — FLUTICASONE PROPIONATE 50 MCG/ACT NA SUSP: NASAL | 0 refills | Status: DC

## 2020-11-28 MED ORDER — AMOXICILLIN-POT CLAVULANATE 875-125 MG PO TABS: 1.0000 | ORAL_TABLET | Freq: Two times a day (BID) | ORAL | 0 refills | Status: AC

## 2020-11-28 NOTE — Progress Notes (Signed)
FOLLOW UP  Date of Service/Encounter:  11/28/20   Assessment:    Moderate persistent asthma, uncomplicated  Perennial allergic rhinitis  Gastroesophageal reflux disease  Plan/Recommendations:    1. Moderate persistent asthma without complication - Lung testing looked fairly good today. - We are going to change your medications around a bit. - Stop the Flovent and the Spiriva. - Start Breztri two puffs twice daily (contains THREE medications to help your lungs).  - Daily controller medication(s): Breztri two puffs twice daily with spacer  - Prior to physical activity: albuterol 2 puffs 10-15 minutes before physical activity. - Rescue medications: albuterol 4 puffs every 4-6 hours as needed - Asthma control goals:  * Full participation in all desired activities (may need albuterol before activity) * Albuterol use two time or less a week on average (not counting use with activity) * Cough interfering with sleep two time or less a month * Oral steroids no more than once a year * No hospitalizations  2. Gastroesophageal reflux disease - Continue with omeprazole daily.   3. Seasonal and perennial allergic rhinitis - with overlying sinusitis - We are going to treat you for a sinus infection lots of mucous and sinus pain/pressure). - DepoMedrol injection given in clinic today (steroid that should last several days). - Start Augmentin one tablet twice daily for TWO WEEKS. - Complete the entire course. - Eat more yogurt to keep the good bacteria around. - Continue with Flonase two sprays per nostril daily in combination with Astelin one spray per nostril daily. - Try alternating antihistiamines (Xyzal 1-2 times daily for one month, Allegra 1-2 times daily for one month, etc).  4. Return in about 2 months (around 01/26/2021).   Subjective:   Tracey Escobar is a 50 y.o. female presenting today for follow up of  Chief Complaint  Patient presents with  . Asthma    Tracey Escobar has a history of the following: Patient Active Problem List   Diagnosis Date Noted  . Allergic conjunctivitis of both eyes 06/21/2019  . OSA (obstructive sleep apnea) 10/17/2018  . Morbid obesity with body mass index (BMI) of 45.0 to 49.9 in adult [facility]) 08/09/2018  . Paroxysmal atrial fibrillation (HCC) 08/09/2018  . Excessive daytime sleepiness 08/09/2018  . Cataplexy 08/09/2018  . Abnormal dreams 08/09/2018  . Nocturia more than twice per night 08/09/2018  . Hypertensive heart disease without heart failure 06/17/2018  . Insomnia 09/08/2017  . Morbid obesity (HCC) 09/08/2017  . Other long term (current) drug therapy 09/08/2017  . Paresthesia of skin 09/08/2017  . Snoring 09/08/2017  . Allergic conjunctivitis 09/08/2017  . Moderate persistent asthma 12/15/2016  . Atrial fibrillation with RVR (HCC) 12/15/2016  . Morbid obesity with BMI of 40.0-44.9, adult (HCC) 12/15/2016  . Acute sinusitis 09/02/2016  . Internal hemorrhoid 07/19/2015  . Gastrointestinal hemorrhage associated with anorectal source 07/17/2015  . Hypertensive disorder 07/17/2015  . Lower abdominal pain 07/17/2015  . Allergic rhinitis 06/11/2015  . Gastroesophageal reflux disease 06/11/2015    History obtained from: chart review and patient.   Tracey Escobar is a 50 y.o. female presenting for a follow up visit. She was last seen in May 2021.  According to the note, she was doing well so Dr. Nunzio Cobbs stepdown therapy.  He held the Spiriva and continue with Flovent 220 mcg 2 puffs twice daily as well as Xopenex as needed.  For her allergic rhinitis, she was continued on Flonase as well as Pataday.  She was also  continued on Protonix for her reflux.  Since last visit, she has been so so. She is not very happy today and seems rather down when I go in to see her.   Asthma/Respiratory Symptom History: She is using the Flovent two puffs in the morning and the Spiriva two puffs at night.  She tells me that she has problems  figuring out what is going on with her breathing. It has been an issue for a while. She had several appointments scheduled but she had to quarantine at the time and this kept pushing it out. She does try using her albuterol with the shortness of breath and it helps. She would be using it a lot if she uses it every time that she actually needs it. She was on Advair 500/50 one puff BID before but she was changed to the [facility] in September 2020 by Thermon Leyland.   Allergic Rhinitis Symptom History: She is always congested. She reports issues with wearing her mask. She does report some itchy eyes. She does have eye drops on board that work somewhat well. The Pataday works best for her. She has been on the Singulair and the Xyzal for a while. She has Astelin to use at night.   She works for [employer] under the [employer]. 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 tShe works in [place] and [place]. She has done this since 2010. She is sedentary in her work and knows that she needs to work out more often.   Otherwise, there have been no changes to her past medical history, surgical history, family history, or social history.    Review of Systems  Constitutional: Negative.  Negative for chills, fever, malaise/fatigue and weight loss.  HENT: Positive for congestion and sinus pain. Negative for ear discharge and ear pain.   Eyes: Negative for pain, discharge and redness.  Respiratory: Positive for cough and shortness of breath. Negative for sputum production and wheezing.   Cardiovascular: Negative.  Negative for chest pain and palpitations.  Gastrointestinal: Negative for abdominal pain, constipation, diarrhea, heartburn, nausea and vomiting.  Skin: Negative.  Negative for itching and rash.  Neurological: Negative for dizziness and headaches.  Endo/Heme/Allergies: Positive for environmental allergies. Does not bruise/bleed easily.        Objective:   Blood pressure 122/70, pulse 87, temperature 97.7 F (36.5 C), temperature source Temporal, resp. rate 20, height 5\' 7"  (1.702 m), weight (!) 303 lb 9.6 oz (137.7 kg), SpO2 100 %. Body mass index is 47.55 kg/m.   Physical Exam:  Physical Exam Constitutional:      Appearance: She is well-developed. She is obese.  HENT:     Head: Normocephalic and atraumatic.     Right Ear: Tympanic membrane, ear canal and external ear normal.     Left Ear: Tympanic membrane, ear canal and external ear normal.     Nose: No nasal deformity, septal deviation, mucosal edema, rhinorrhea or epistaxis.     Right Turbinates: Enlarged and swollen.     Left Turbinates: Enlarged and swollen.     Right Sinus: No maxillary sinus tenderness or frontal sinus tenderness.     Left Sinus: No maxillary sinus tenderness or frontal sinus tenderness.     Comments: Turbinates enlarged bilaterally.    Mouth/Throat:     Lips: Pink.     Mouth: Oropharynx is clear and moist. Mucous membranes are moist. Mucous membranes are not pale and not dry.  Pharynx: Uvula midline.     Comments: Cobblestoning present. Eyes:     General:        Right eye: No discharge.        Left eye: No discharge.     Extraocular Movements: EOM normal.     Conjunctiva/sclera: Conjunctivae normal.     Right eye: Right conjunctiva is not injected. No chemosis.    Left eye: Left conjunctiva is not injected. No chemosis.    Pupils: Pupils are equal, round, and reactive to light.  Cardiovascular:     Rate and Rhythm: Normal rate and regular rhythm.     Heart sounds: Normal heart sounds.  Pulmonary:     Effort: Pulmonary effort is normal. No tachypnea, accessory muscle usage or respiratory distress.     Breath sounds: Normal breath sounds. No wheezing, rhonchi or rales.     Comments: Somewhat decreased air movement at the bases.  No wheezing or crackles. Chest:     Chest wall: No tenderness.  Lymphadenopathy:     Cervical:  No cervical adenopathy.  Skin:    Coloration: Skin is not pale.     Findings: No abrasion, erythema, petechiae or rash. Rash is not papular, urticarial or vesicular.  Neurological:     Mental Status: She is alert.  Psychiatric:        Mood and Affect: Mood and affect normal.       Diagnostic studies:    Spirometry: results abnormal (FEV1: 1.43/55%, FVC: 1.79/55%, FEV1/FVC: 80%).    Spirometry consistent with possible restrictive disease.   Allergy Studies: none        Malachi Bonds, MD  [facility]

## 2020-11-28 NOTE — Patient Instructions (Addendum)
1. Moderate persistent asthma without complication - Lung testing looked fairly good today. - We are going to change your medications around a bit. - Stop the Flovent and the Spiriva. - Start Breztri two puffs twice daily (contains THREE medications to help your lungs).  - Daily controller medication(s): Breztri two puffs twice daily with spacer  - Prior to physical activity: albuterol 2 puffs 10-15 minutes before physical activity. - Rescue medications: albuterol 4 puffs every 4-6 hours as needed - Asthma control goals:  * Full participation in all desired activities (may need albuterol before activity) * Albuterol use two time or less a week on average (not counting use with activity) * Cough interfering with sleep two time or less a month * Oral steroids no more than once a year * No hospitalizations  2. Gastroesophageal reflux disease - Continue with omeprazole daily.   3. Seasonal and perennial allergic rhinitis - with overlying sinusitis - We are going to treat you for a sinus infection lots of mucous and sinus pain/pressure). - DepoMedrol injection given in clinic today (steroid that should last several days). - Start Augmentin one tablet twice daily for TWO WEEKS. - Complete the entire course. - Eat more yogurt to keep the good bacteria around. - Continue with Flonase two sprays per nostril daily in combination with Astelin one spray per nostril daily. - Try alternating antihistiamines (Xyzal 1-2 times daily for one month, Allegra 1-2 times daily for one month, etc).  4. Return in about 2 months (around 01/26/2021).    Please inform us of any Emergency Department visits, hospitalizations, or changes in symptoms. Call us before going to the ED for breathing or allergy symptoms since we might be able to fit you in for a sick visit. Feel free to contact us anytime with any questions, problems, or concerns.  It was a pleasure to see you again today!  Websites that have reliable  patient information: 1. American Academy of Asthma, Allergy, and Immunology: www.aaaai.org 2. Food Allergy Research and Education (FARE): foodallergy.org 3. Mothers of Asthmatics: http://www.asthmacommunitynetwork.org 4. American College of Allergy, Asthma, and Immunology: www.acaai.org   COVID-19 Vaccine Information can be found at: [email or web address] For questions related to vaccine distribution or appointments, please email [email or web address] or call [phone].   We realize that you might be concerned about having an allergic reaction to the COVID19 vaccines. To help with that concern, WE ARE OFFERING THE COVID19 VACCINES IN OUR OFFICE! Ask the front desk for dates!     "Like" [place] on Facebook and Instagram for our latest updates!      A healthy democracy works best when [employer] participate! Make sure you are registered to vote! If you have moved or changed any of your contact information, you will need to get this updated before voting!  In some cases, you MAY be able to register to vote online: [email or web address]

## 2020-12-06 ENCOUNTER — Telehealth: Payer: Self-pay | Admitting: Allergy & Immunology

## 2020-12-06 NOTE — Telephone Encounter (Signed)
Tracey Escobar called the office this morning asking if she could start immunotherapy.  I did not see it in her last visit note where it had been discussed.  I asked her if she had mentioned it to you and she stated she did not.  [name] would like to know if she can start immunotherapy and if so, what does she need to do.  I told her once you responded a nurse would call her back with the next steps. Please advise.

## 2020-12-06 NOTE — Telephone Encounter (Signed)
I am fine with starting allergy shots on her. I called to discuss with her and the line was busy. Can someone call her on Monday to discuss this and schedule a start injection appointment?    Malachi Bonds, MD [facility]

## 2020-12-09 NOTE — Telephone Encounter (Signed)
Pt is schduled for march 23rd at 330 to start injections

## 2020-12-09 NOTE — Telephone Encounter (Signed)
I was reviewing and it looks like she needs updated testing. I cannot seem to find the last time that she had skin testing.  Can you make a skin testing appointment in the next week or so for her? You can be creative with my schedule and double book if you would like.   Malachi Bonds, MD [facility]

## 2020-12-10 ENCOUNTER — Encounter: Payer: Self-pay | Admitting: Internal Medicine

## 2020-12-10 ENCOUNTER — Ambulatory Visit (INDEPENDENT_AMBULATORY_CARE_PROVIDER_SITE_OTHER): Payer: 59 | Admitting: Internal Medicine

## 2020-12-10 ENCOUNTER — Other Ambulatory Visit: Payer: Self-pay

## 2020-12-10 VITALS — BP 124/82 | HR 74 | Temp 98.0°F | Ht 67.0 in | Wt 301.4 lb

## 2020-12-10 DIAGNOSIS — Z6841 Body Mass Index (BMI) 40.0 and over, adult: Secondary | ICD-10-CM

## 2020-12-10 DIAGNOSIS — I480 Paroxysmal atrial fibrillation: Secondary | ICD-10-CM | POA: Diagnosis not present

## 2020-12-10 DIAGNOSIS — I119 Hypertensive heart disease without heart failure: Secondary | ICD-10-CM | POA: Diagnosis not present

## 2020-12-10 DIAGNOSIS — H6121 Impacted cerumen, right ear: Secondary | ICD-10-CM | POA: Diagnosis not present

## 2020-12-10 DIAGNOSIS — Z0000 Encounter for general adult medical examination without abnormal findings: Secondary | ICD-10-CM

## 2020-12-10 LAB — POCT URINALYSIS DIPSTICK
Bilirubin, UA: NEGATIVE
Blood, UA: NEGATIVE
Glucose, UA: NEGATIVE
Ketones, UA: NEGATIVE
Leukocytes, UA: NEGATIVE
Nitrite, UA: NEGATIVE
Protein, UA: NEGATIVE
Spec Grav, UA: 1.02 (ref 1.010–1.025)
Urobilinogen, UA: 0.2 E.U./dL
pH, UA: 6 (ref 5.0–8.0)

## 2020-12-10 LAB — POCT UA - MICROALBUMIN
Albumin/Creatinine Ratio, Urine, POC: ABNORMAL
Microalbumin Ur, POC: 80 mg/L

## 2020-12-10 NOTE — Progress Notes (Signed)
I,Tianna Badgett,acting as a scribe for Robyn N Sanders, MD.,have documented all relevant documentation on the behalf of Robyn N Sanders, MD,as directed by  Robyn N Sanders, MD while in the presence of Robyn N Sanders, MD.  This visit occurred during the SARS-CoV-2 public health emergency.  Safety protocols were in place, including screening questions prior to the visit, additional usage of staff PPE, and extensive cleaning of exam room while observing appropriate contact time as indicated for disinfecting solutions.  Subjective:     Patient ID: Tracey Escobar , female    DOB: 11/24/1970 , 50 y.o.   MRN: [id number]   Chief Complaint  Patient presents with  . Annual Exam  . Hypertension    HPI  She is here today for a full physical examination.  She is followed by Dr. Rivard for her GYN exams. Her last PAP was Dec 28th 2021. She reports that she feels well today. She reports compliance with meds.   Hypertension This is a chronic problem. The current episode started more than 1 year ago. The problem has been gradually improving since onset. The problem is controlled. Pertinent negatives include no blurred vision, chest pain, palpitations or shortness of breath. Risk factors for coronary artery disease include obesity. Past treatments include lifestyle changes, angiotensin blockers and diuretics. The current treatment provides moderate improvement.     Past Medical History:  Diagnosis Date  . Arthritis   . Asthma   . Atrial fibrillation (HCC)   . Dysrhythmia    A-Fib  . GERD (gastroesophageal reflux disease)   . Hypertension   . Irritable bowel   . Pneumonia   . Sleep apnea   . Sleep apnea   . Vitamin D deficiency      Family History  Problem Relation Age of Onset  . Hyperlipidemia Mother   . Hypertension Mother   . Hyperlipidemia Father   . Hypertension Father   . COPD Father   . Heart failure Father   . Diabetes Brother   . Hypertension Maternal Grandmother   .  Seizures Maternal Grandmother   . Cancer Paternal Grandfather   . Cancer Brother   . Allergic rhinitis Neg Hx   . Angioedema Neg Hx   . Asthma Neg Hx   . Eczema Neg Hx   . Immunodeficiency Neg Hx   . Urticaria Neg Hx      Current Outpatient Medications:  .  azelastine (ASTELIN) 0.1 % nasal spray, Place 1 spray into both nostrils 2 (two) times daily., Disp: 30 mL, Rfl: 1 .  Cholecalciferol (VITAMIN D3) 5000 UNITS TABS, Take 1 tablet by mouth daily., Disp: , Rfl:  .  EPINEPHrine (EPIPEN 2-PAK) 0.3 mg/0.3 mL IJ SOAJ injection, Use as directed for severe allergic reactions, Disp: 2 each, Rfl: 3 .  Estradiol 10 MCG TABS vaginal tablet, estradiol 10 mcg vaginal tablet, Disp: , Rfl:  .  flecainide (TAMBOCOR) 50 MG tablet, 1 tab 2 times per day, Disp: , Rfl:  .  fluticasone (FLONASE) 50 MCG/ACT nasal spray, USE 2 SPRAYS IN EACH NOSTRIL ONCE DAILY FOR STUFFY NOSE, Disp: 16 g, Rfl: 0 .  hydrochlorothiazide (HYDRODIURIL) 12.5 MG tablet, TAKE 1 TABLET(12.5 MG) BY MOUTH DAILY, Disp: 90 tablet, Rfl: 0 .  levocetirizine (XYZAL) 5 MG tablet, TAKE 1 TABLET BY MOUTH EVERY DAY IN THE EVENING (Patient not taking: Reported on 12/12/2020), Disp: 90 tablet, Rfl: 2 .  LINZESS 290 MCG CAPS capsule, TAKE 1 CAPSULE BY MOUTH EVERY DAY 30 MINUTES   BEFORE FIRST MEAL OF THE DAY ON AN EMPTY STOMACH, Disp: 90 capsule, Rfl: 2 .  losartan (COZAAR) 25 MG tablet, TAKE 1 TABLET(25 MG) BY MOUTH DAILY, Disp: 90 tablet, Rfl: 0 .  LUMIFY 0.025 % SOLN, SMARTSIG:1 Drop(s) In Eye(s) Every 12 Hours PRN, Disp: , Rfl:  .  medroxyPROGESTERone (DEPO-PROVERA) 150 MG/ML injection, medroxyprogesterone 150 mg/mL intramuscular suspension  Inject 1 mL every 3 months by intramuscular route around the clock., Disp: , Rfl:  .  metoprolol succinate (TOPROL-XL) 100 MG 24 hr tablet, Take 100 mg by mouth daily. , Disp: , Rfl:  .  montelukast (SINGULAIR) 10 MG tablet, TAKE 1 TABLET(10 MG) BY MOUTH AT BEDTIME, Disp: 90 tablet, Rfl: 1 .  pantoprazole  (PROTONIX) 40 MG tablet, TAKE 1 TABLET(40 MG) BY MOUTH DAILY, Disp: 90 tablet, Rfl: 1 .  RESTASIS 0.05 % ophthalmic emulsion, , Disp: , Rfl:  .  rivaroxaban (XARELTO) 20 MG TABS tablet, TAKE 1 TABLET(20 MG) BY MOUTH WITH SUPPER, Disp: , Rfl:  .  Suvorexant (BELSOMRA) 20 MG TABS, One tab po qhs prn, Disp: 30 tablet, Rfl: 2 .  Alcaftadine (LASTACAFT) 0.25 % SOLN, INSTILL 1 DROP IN BOTH EYES DAILY AS NEEDED, Disp: 3 mL, Rfl: 5 .  Budeson-Glycopyrrol-Formoterol (BREZTRI AEROSPHERE) 160-9-4.8 MCG/ACT AERO, Inhale 2 sprays into the lungs in the morning and at bedtime. 2 puffs 2 times per day, Disp: 10.7 g, Rfl: 5   No Known Allergies    The patient states she uses none for birth control. Last LMP was No LMP recorded (lmp unknown). Patient has had an injection.. Negative for Dysmenorrhea. Negative for: breast discharge, breast lump(s), breast pain and breast self exam. Associated symptoms include abnormal vaginal bleeding. Pertinent negatives include abnormal bleeding (hematology), anxiety, decreased libido, depression, difficulty falling sleep, dyspareunia, history of infertility, nocturia, sexual dysfunction, sleep disturbances, urinary incontinence, urinary urgency, vaginal discharge and vaginal itching. Diet regular.The patient states her exercise level is    . The patient's tobacco use is:  Social History   Tobacco Use  Smoking Status Never Smoker  Smokeless Tobacco Never Used  . She has been exposed to passive smoke. The patient's alcohol use is:  Social History   Substance and Sexual Activity  Alcohol Use Yes  . Alcohol/week: 1.0 standard drink  . Types: 1 Glasses of wine per week   Comment: rare use   Review of Systems  Constitutional: Negative.   HENT: Negative.   Eyes: Negative.  Negative for blurred vision.  Respiratory: Negative.  Negative for shortness of breath.   Cardiovascular: Negative.  Negative for chest pain and palpitations.  Gastrointestinal: Negative.   Endocrine:  Negative.   Genitourinary: Negative.   Musculoskeletal: Negative.   Skin: Negative.   Allergic/Immunologic: Negative.   Neurological: Negative.   Hematological: Negative.   Psychiatric/Behavioral: Negative.      Today's Vitals   12/10/20 0906  BP: 124/82  Pulse: 74  Temp: 98 F (36.7 C)  TempSrc: Oral  Weight: (!) 301 lb 6.4 oz (136.7 kg)  Height: 5' 7" (1.702 m)   Body mass index is 47.21 kg/m.  Wt Readings from Last 3 Encounters:  12/10/20 (!) 301 lb 6.4 oz (136.7 kg)  11/28/20 (!) 303 lb 9.6 oz (137.7 kg)  10/30/20 (!) 306 lb (138.8 kg)    Objective:  Physical Exam Vitals and nursing note reviewed.  Constitutional:      Appearance: Normal appearance. She is obese.  HENT:     Head: Normocephalic and atraumatic.  Right Ear: Ear canal and external ear normal. There is impacted cerumen.     Left Ear: Tympanic membrane, ear canal and external ear normal.     Nose:     Comments: Masked     Mouth/Throat:     Comments: Masked  Eyes:     Extraocular Movements: Extraocular movements intact.     Conjunctiva/sclera: Conjunctivae normal.     Pupils: Pupils are equal, round, and reactive to light.  Cardiovascular:     Rate and Rhythm: Normal rate and regular rhythm.     Pulses: Normal pulses.     Heart sounds: Normal heart sounds.  Pulmonary:     Effort: Pulmonary effort is normal.     Breath sounds: Normal breath sounds.  Chest:  Breasts:     Tanner Score is 5.     Right: Normal.     Left: Normal.    Abdominal:     General: Bowel sounds are normal.     Palpations: Abdomen is soft.     Comments: Obese, soft  Genitourinary:    Comments: deferred Musculoskeletal:        General: Normal range of motion.     Cervical back: Normal range of motion and neck supple.  Skin:    General: Skin is warm and dry.  Neurological:     General: No focal deficit present.     Mental Status: She is alert and oriented to person, place, and time.  Psychiatric:        Mood  and Affect: Mood normal.        Behavior: Behavior normal.         Assessment And Plan:     1. Routine general medical examination at health care facility Comments: A full exam was performed. Importance of monthly self breast exams was discussed with the patient. PATIENT IS ADVISED TO GET 30-45 MINUTES REGULAR EXERCISE NO LESS THAN FOUR TO FIVE DAYS PER WEEK - BOTH WEIGHTBEARING EXERCISES AND AEROBIC ARE RECOMMENDED.  PATIENT IS ADVISED TO FOLLOW A HEALTHY DIET WITH AT LEAST SIX FRUITS/VEGGIES PER DAY, DECREASE INTAKE OF RED MEAT, AND TO INCREASE FISH INTAKE TO TWO DAYS PER WEEK.  MEATS/FISH SHOULD NOT BE FRIED, BAKED OR BROILED IS PREFERABLE.  IT IS ALSO IMPORTANT TO CUT BACK ON YOUR SUGAR INTAKE. PLEASE AVOID ANYTHING WITH ADDED SUGAR, CORN SYRUP OR OTHER SWEETENERS. IF YOU MUST USE A SWEETENER, YOU CAN TRY STEVIA. IT IS ALSO IMPORTANT TO AVOID ARTIFICIALLY SWEETENERS AND DIET BEVERAGES. LASTLY, I SUGGEST WEARING SPF 50 SUNSCREEN ON EXPOSED PARTS AND ESPECIALLY WHEN IN THE DIRECT SUNLIGHT FOR AN EXTENDED PERIOD OF TIME.  PLEASE AVOID FAST FOOD RESTAURANTS AND INCREASE YOUR WATER INTAKE.  - Lipid panel - CBC - BMP8+EGFR  2. Hypertensive heart disease without heart failure Comments: Chronic, well controlled. She is encouraged to follow low sodium diet. EKG performed, NSR w/o acute changes. She will f/u in six months for re-evaluation.  - EKG 12-Lead - POCT Urinalysis Dipstick (81002) - POCT UA - Microalbumin  3. Paroxysmal atrial fibrillation (HCC) Comments: Chronic. She is currently in sinus rhythm, rate controlled and properly anticoagulated .  4. Impacted cerumen of right ear  AFTER OBTAINING VERBAL CONSENT, RIGHT EAR WAS FLUSHED BY IRRIGATION. SHE TOLERATED PROCEDURE WELL WITHOUT ANY COMPLICATIONS. NO TM ABNORMALITIES WERE NOTED.  5. Hypercalcemia Comments: She does admit to bone pain. I will check PTH intact today.  - Parathyroid Hormone, Intact w/Ca  6. Class 3 severe obesity  due   to excess calories with serious comorbidity and body mass index (BMI) of 45.0 to 49.9 in adult [facility]) Comments: BMI 47. We discussed the use of Rybelsus to address insulin resistance. She denies family h/o thyroid cancer. She was given samples of Rybelsus, 38m to take upon awakening with four ounces of water. She is advised to stop eating when full. Possible side effects were discussed with the patient. She will f/u in six weeks for re-evaluation.   Patient was given opportunity to ask questions. Patient verbalized understanding of the plan and was able to repeat key elements of the plan. All questions were answered to their satisfaction.   I, RMaximino Greenland MD, have reviewed all documentation for this visit. The documentation on 12/10/20 for the exam, diagnosis, procedures, and orders are all accurate and complete.  THE PATIENT IS ENCOURAGED TO PRACTICE SOCIAL DISTANCING DUE TO THE COVID-19 PANDEMIC.

## 2020-12-10 NOTE — Patient Instructions (Signed)
Health Maintenance, Female Adopting a healthy lifestyle and getting preventive care are important in promoting health and wellness. Ask your health care provider about:  The right schedule for you to have regular tests and exams.  Things you can do on your own to prevent diseases and keep yourself healthy. What should I know about diet, weight, and exercise? Eat a healthy diet  Eat a diet that includes plenty of vegetables, fruits, low-fat dairy products, and lean protein.  Do not eat a lot of foods that are high in solid fats, added sugars, or sodium.   Maintain a healthy weight Body mass index (BMI) is used to identify weight problems. It estimates body fat based on height and weight. Your health care provider can help determine your BMI and help you achieve or maintain a healthy weight. Get regular exercise Get regular exercise. This is one of the most important things you can do for your health. Most adults should:  Exercise for at least 150 minutes each week. The exercise should increase your heart rate and make you sweat (moderate-intensity exercise).  Do strengthening exercises at least twice a week. This is in addition to the moderate-intensity exercise.  Spend less time sitting. Even light physical activity can be beneficial. Watch cholesterol and blood lipids Have your blood tested for lipids and cholesterol at 51 years of age, then have this test every 5 years. Have your cholesterol levels checked more often if:  Your lipid or cholesterol levels are high.  You are older than 51 years of age.  You are at high risk for heart disease. What should I know about cancer screening? Depending on your health history and family history, you may need to have cancer screening at various ages. This may include screening for:  Breast cancer.  Cervical cancer.  Colorectal cancer.  Skin cancer.  Lung cancer. What should I know about heart disease, diabetes, and high blood  pressure? Blood pressure and heart disease  High blood pressure causes heart disease and increases the risk of stroke. This is more likely to develop in people who have high blood pressure readings, are of African descent, or are overweight.  Have your blood pressure checked: ? Every 3-5 years if you are 18-39 years of age. ? Every year if you are 40 years old or older. Diabetes Have regular diabetes screenings. This checks your fasting blood sugar level. Have the screening done:  Once every three years after age 40 if you are at a normal weight and have a low risk for diabetes.  More often and at a younger age if you are overweight or have a high risk for diabetes. What should I know about preventing infection? Hepatitis B If you have a higher risk for hepatitis B, you should be screened for this virus. Talk with your health care provider to find out if you are at risk for hepatitis B infection. Hepatitis C Testing is recommended for:  Everyone born from 1945 through 1965.  Anyone with known risk factors for hepatitis C. Sexually transmitted infections (STIs)  Get screened for STIs, including gonorrhea and chlamydia, if: ? You are sexually active and are younger than 51 years of age. ? You are older than 51 years of age and your health care provider tells you that you are at risk for this type of infection. ? Your sexual activity has changed since you were last screened, and you are at increased risk for chlamydia or gonorrhea. Ask your health care provider   if you are at risk.  Ask your health care provider about whether you are at high risk for HIV. Your health care provider may recommend a prescription medicine to help prevent HIV infection. If you choose to take medicine to prevent HIV, you should first get tested for HIV. You should then be tested every 3 months for as long as you are taking the medicine. Pregnancy  If you are about to stop having your period (premenopausal) and  you may become pregnant, seek counseling before you get pregnant.  Take 400 to 800 micrograms (mcg) of folic acid every day if you become pregnant.  Ask for birth control (contraception) if you want to prevent pregnancy. Osteoporosis and menopause Osteoporosis is a disease in which the bones lose minerals and strength with aging. This can result in bone fractures. If you are 65 years old or older, or if you are at risk for osteoporosis and fractures, ask your health care provider if you should:  Be screened for bone loss.  Take a calcium or vitamin D supplement to lower your risk of fractures.  Be given hormone replacement therapy (HRT) to treat symptoms of menopause. Follow these instructions at home: Lifestyle  Do not use any products that contain nicotine or tobacco, such as cigarettes, e-cigarettes, and chewing tobacco. If you need help quitting, ask your health care provider.  Do not use street drugs.  Do not share needles.  Ask your health care provider for help if you need support or information about quitting drugs. Alcohol use  Do not drink alcohol if: ? Your health care provider tells you not to drink. ? You are pregnant, may be pregnant, or are planning to become pregnant.  If you drink alcohol: ? Limit how much you use to 0-1 drink a day. ? Limit intake if you are breastfeeding.  Be aware of how much alcohol is in your drink. In the [place] one drink equals one 12 oz bottle of beer (355 mL), one 5 oz glass of wine (148 mL), or one 1 oz glass of hard liquor (44 mL). General instructions  Schedule regular health, dental, and eye exams.  Stay current with your vaccines.  Tell your health care provider if: ? You often feel depressed. ? You have ever been abused or do not feel safe at home. Summary  Adopting a healthy lifestyle and getting preventive care are important in promoting health and wellness.  Follow your health care provider's instructions about healthy  diet, exercising, and getting tested or screened for diseases.  Follow your health care provider's instructions on monitoring your cholesterol and blood pressure. This information is not intended to replace advice given to you by your health care provider. Make sure you discuss any questions you have with your health care provider. Document Revised: 09/14/2018 Document Reviewed: 09/14/2018 [employer] Patient Education  2021 [employer]  

## 2020-12-11 LAB — BMP8+EGFR
BUN/Creatinine Ratio: 15 (ref 9–23)
BUN: 12 mg/dL (ref 6–24)
CO2: 18 mmol/L — ABNORMAL LOW (ref 20–29)
Calcium: 10.5 mg/dL — ABNORMAL HIGH (ref 8.7–10.2)
Chloride: 106 mmol/L (ref 96–106)
Creatinine, Ser: 0.82 mg/dL (ref 0.57–1.00)
Glucose: 84 mg/dL (ref 65–99)
Potassium: 3.8 mmol/L (ref 3.5–5.2)
Sodium: 142 mmol/L (ref 134–144)
eGFR: 87 mL/min/{1.73_m2} (ref >59)

## 2020-12-11 LAB — LIPID PANEL
Chol/HDL Ratio: 3 ratio (ref 0.0–4.4)
Cholesterol, Total: 145 mg/dL (ref 100–199)
HDL: 49 mg/dL (ref >39)
LDL Chol Calc (NIH): 82 mg/dL (ref 0–99)
Triglycerides: 72 mg/dL (ref 0–149)
VLDL Cholesterol Cal: 14 mg/dL (ref 5–40)

## 2020-12-11 LAB — CBC
Hematocrit: 39.8 % (ref 34.0–46.6)
Hemoglobin: 13.4 g/dL (ref 11.1–15.9)
MCH: 28.5 pg (ref 26.6–33.0)
MCHC: 33.7 g/dL (ref 31.5–35.7)
MCV: 85 fL (ref 79–97)
Platelets: 364 10*3/uL (ref 150–450)
RBC: 4.71 x10E6/uL (ref 3.77–5.28)
RDW: 13.2 % (ref 11.7–15.4)
WBC: 6.3 10*3/uL (ref 3.4–10.8)

## 2020-12-11 LAB — PTH, INTACT AND CALCIUM: PTH: 31 pg/mL (ref 15–65)

## 2020-12-12 ENCOUNTER — Encounter: Payer: Self-pay | Admitting: Allergy & Immunology

## 2020-12-12 ENCOUNTER — Ambulatory Visit: Payer: 59 | Admitting: Allergy & Immunology

## 2020-12-12 ENCOUNTER — Other Ambulatory Visit: Payer: Self-pay

## 2020-12-12 VITALS — BP 122/82 | HR 101 | Resp 16

## 2020-12-12 DIAGNOSIS — K219 Gastro-esophageal reflux disease without esophagitis: Secondary | ICD-10-CM

## 2020-12-12 DIAGNOSIS — J3089 Other allergic rhinitis: Secondary | ICD-10-CM | POA: Diagnosis not present

## 2020-12-12 DIAGNOSIS — J4540 Moderate persistent asthma, uncomplicated: Secondary | ICD-10-CM

## 2020-12-12 DIAGNOSIS — J302 Other seasonal allergic rhinitis: Secondary | ICD-10-CM | POA: Diagnosis not present

## 2020-12-12 MED ORDER — BREZTRI AEROSPHERE 160-9-4.8 MCG/ACT IN AERO: 2.0000 | INHALATION_SPRAY | Freq: Two times a day (BID) | RESPIRATORY_TRACT | 5 refills | Status: DC

## 2020-12-12 MED ORDER — LASTACAFT 0.25 % OP SOLN: OPHTHALMIC | 5 refills | Status: DC

## 2020-12-12 NOTE — Progress Notes (Signed)
FOLLOW UP  Date of Service/Encounter:  12/12/20   Assessment:   Moderate persistent asthma, uncomplicated  Perennial allergic rhinitis (grasses, trees)  - with previous sensitization to dust mite in April 2015 as well  Gastroesophageal reflux disease  Plan/Recommendations:   1. Moderate persistent asthma without complication - Lung testing looked much better today. - We will not make any medication changes at this time.  - Daily controller medication(s): Breztri two puffs twice daily with spacer  - Prior to physical activity: albuterol 2 puffs 10-15 minutes before physical activity. - Rescue medications: albuterol 4 puffs every 4-6 hours as needed - Asthma control goals:  * Full participation in all desired activities (may need albuterol before activity) * Albuterol use two time or less a week on average (not counting use with activity) * Cough interfering with sleep two time or less a month * Oral steroids no more than once a year * No hospitalizations  2. Gastroesophageal reflux disease - Continue with omeprazole daily.   3. Seasonal and perennial allergic rhinitis - Testing was positive to grasses and trees (results provided). - We are going to start allergy shots. - Continue with an antihistamine daily and consider changing these every couple of months since your body can become "used" to the. - Make an appointment to start shots in 2-3 weeks.   4. Return in about 6 months (around 06/14/2021).   Subjective:   Tracey Escobar is a 50 y.o. female presenting today for follow up of  Chief Complaint  Patient presents with  . Allergy Testing    Tracey Escobar has a history of the following: Patient Active Problem List   Diagnosis Date Noted  . Allergic conjunctivitis of both eyes 06/21/2019  . OSA (obstructive sleep apnea) 10/17/2018  . Morbid obesity with body mass index (BMI) of 45.0 to 49.9 in adult [facility]) 08/09/2018  . Paroxysmal atrial fibrillation (HCC)  08/09/2018  . Excessive daytime sleepiness 08/09/2018  . Cataplexy 08/09/2018  . Abnormal dreams 08/09/2018  . Nocturia more than twice per night 08/09/2018  . Hypertensive heart disease without heart failure 06/17/2018  . Insomnia 09/08/2017  . Morbid obesity (HCC) 09/08/2017  . Other long term (current) drug therapy 09/08/2017  . Paresthesia of skin 09/08/2017  . Snoring 09/08/2017  . Allergic conjunctivitis 09/08/2017  . Moderate persistent asthma 12/15/2016  . Atrial fibrillation with RVR (HCC) 12/15/2016  . Morbid obesity with BMI of 40.0-44.9, adult (HCC) 12/15/2016  . Acute sinusitis 09/02/2016  . Internal hemorrhoid 07/19/2015  . Gastrointestinal hemorrhage associated with anorectal source 07/17/2015  . Hypertensive disorder 07/17/2015  . Lower abdominal pain 07/17/2015  . Allergic rhinitis 06/11/2015  . Gastroesophageal reflux disease 06/11/2015    History obtained from: chart review and patient.  Tracey Escobar is a 50 y.o. female presenting for skin testing.  She was last seen in February 2022.  At that time, her lung testing looked great.  We changed her  regimen around a bit.  We stopped the Flovent and Spiriva and instead started [place] twice daily.  We also continued with omeprazole daily.  For her allergic rhinitis, we gave her a Depo-Medrol injection since she was feeling so terrible.  We started Augmentin for 2 weeks.  We also continue with Flonase as well as Astelin.  We recommended starting an antihistamine and alternating it every 1 to 2 months.  Since last visit, she has done well.  She made the decision to start allergen immunotherapy and her skin testing  was not within a 5-year window.  Therefore she presents today for repeat skin testing.  It should be noted that her symptoms occur throughout the year.  In particular, she wakes up every morning with congestion.  She does feel that the new inhaler has helped her tremendously.   She is very happy with it.  Her albuterol  use is all but eliminated.  She has not been to the emergency room nor she required any systemic prednisone.  Otherwise, there have been no changes to her past medical history, surgical history, family history, or social history.    Review of Systems  Constitutional: Negative.  Negative for chills, fever, malaise/fatigue and weight loss.  HENT: Positive for congestion. Negative for ear discharge and ear pain.   Eyes: Negative for pain, discharge and redness.  Respiratory: Negative for cough, sputum production, shortness of breath and wheezing.   Cardiovascular: Negative.  Negative for chest pain and palpitations.  Gastrointestinal: Negative for abdominal pain, constipation, diarrhea, heartburn, nausea and vomiting.  Skin: Negative.  Negative for itching and rash.  Neurological: Negative for dizziness and headaches.  Endo/Heme/Allergies: Negative for environmental allergies. Does not bruise/bleed easily.       Objective:   Blood pressure 122/82, pulse (!) 101, resp. rate 16, SpO2 99 %. There is no height or weight on file to calculate BMI.   Physical Exam:   General:  alert, active, in no acute distress Head:  normocephalic, no masses, lesions, tenderness or abnormalities Eyes:  conjunctiva clear without injection or discharge, EOMI, PERL Ears:  TM's pearly white bilaterally, external auditory canals are clear, external ears are normally set and rotated Nose:  External nose within normal limits, enlarged appearing turbinates, clear-colored discharge, septum midline Throat:  moist mucous membranes without erythema, exudates or petechiae, no thrush Neck:  Supple without thyromegaly or adenopathy appreciated Lymphatic: no adenopathy appreciated in the cervical, posterior auricular, axillary, epitrochlear, inguinal, or popliteal regions Lungs:  clear to auscultation, no wheezing, crackles or rhonchi, breathing unlabored, moving air well in all lung fields Heart:  regular rate and  rhythm, normal S1/S2, no murmurs or gallops, normal peripheral perfusion Musculoskeletal:  no cyanosis, clubbing or edema Skin:  skin color, texture and turgor are normal; no bruising, rashes or lesions noted.     Diagnostic studies:   Spirometry: results abnormal (FEV1: 1.70/66%, FVC: 2.02/63%, FEV1/FVC: 84%).    Spirometry consistent with possible restrictive disease.   Allergy Studies:    Airborne Adult Perc - 12/12/20 1403    Time Antigen Placed 1403    Allergen Manufacturer Waynette Buttery    Location Back    Number of Test 59    1. Control-Buffer 50% Glycerol Negative    2. Control-Histamine 1 mg/ml 2+    3. Albumin saline Negative    4. Bahia Negative    5. [place] Territ[place] Negative    6. Johnson Negative    7. Kentucky Blue Negative    8. Meadow Fescue Negative    9. Perennial Rye Negative    10. Sweet Vernal Negative    11. Timothy Negative    12. Cocklebur Negative    13. Burweed Marshelder Negative    14. Ragweed, short Negative    15. Ragweed, Giant Negative    16. Plantain,  English Negative    17. Lamb's Quarters Negative    18. Sheep Sorrell Negative    19. Rough Pigweed Negative    20. Marsh Elder, Rough Negative    21. Mugwort, Common Negative  22. Ash mix Negative    23. Birch mix Negative    24. Beech American Negative    25. Box, Elder Negative    26. Cedar, red 4+    27. Cottonwood, Guinea-Bissau Negative    28. Elm mix Negative    29. Hickory Negative    30. Maple mix Negative    31. Oak, Guinea-[place]iss[place] mix Negative    32. Pecan Pollen Negative    33. Pine mix Negative    34. Sycamore Eastern Negative    35. Walnut, Black Pollen Negative    36. Alternaria alternata Negative    37. Cladosporium Herbarum Negative    38. Aspergillus mix Negative    39. Penicillium mix Negative    40. Bipolaris sorokiniana (Helminthosporium) Negative    41. Drechslera spicifera (Curvularia) Negative    42. Mucor plumbeus Negative    43. Fusarium moniliforme Negative    44.  Aureobasidium pullulans (pullulara) Negative    45. Rhizopus oryzae Negative    46. Botrytis cinera Negative    47. Epicoccum nigrum Negative    48. Phoma betae Negative    49. Candida Albicans Negative    50. Trichophyton mentagrophytes Negative    51. Mite, D Farinae  5,000 AU/ml Negative    52. Mite, D Pteronyssinus  5,000 AU/ml Negative    53. Cat Hair 10,000 BAU/ml Negative    54.  Dog Epithelia Negative    55. Mixed Feathers Negative    56. Horse Epithelia Negative    57. Cockroach, German Negative    58. Mouse Negative    59. Tobacco Leaf Negative          Intradermal - 12/12/20 1509    Time Antigen Placed 1450    Allergen Manufacturer Waynette Buttery    Location Arm    Number of Test 15    Intradermal Select    Control Negative    [place] Southern Territ[place] Negative    Johnson Negative    7 Grass 4+    Ragweed mix Negative    Weed mix Negative    Tree mix 2+    Mold 1 Negative    Mold 2 Negative    Mold 3 Negative    Mold 4 Negative    Cat Negative    Dog Negative    Cockroach Negative    Mite mix Negative           Allergy testing results were read and interpreted by myself, documented by clinical staff.      Malachi Bonds, MD  [facility]

## 2020-12-12 NOTE — Patient Instructions (Addendum)
1. Moderate persistent asthma without complication - Lung testing looked much better today. - We will not make any medication changes at this time.  - Daily controller medication(s): Breztri two puffs twice daily with spacer  - Prior to physical activity: albuterol 2 puffs 10-15 minutes before physical activity. - Rescue medications: albuterol 4 puffs every 4-6 hours as needed - Asthma control goals:  * Full participation in all desired activities (may need albuterol before activity) * Albuterol use two time or less a week on average (not counting use with activity) * Cough interfering with sleep two time or less a month * Oral steroids no more than once a year * No hospitalizations  2. Gastroesophageal reflux disease - Continue with omeprazole daily.   3. Seasonal and perennial allergic rhinitis - Testing was positive to grasses and trees (results provided). - We are going to start allergy shots. - Continue with an antihistamine daily and consider changing these every couple of months since your body can become "used" to the. - Make an appointment to start shots in 2-3 weeks.   4. Return in about 6 months (around 06/14/2021).    Please inform us of any Emergency Department visits, hospitalizations, or changes in symptoms. Call us before going to the ED for breathing or allergy symptoms since we might be able to fit you in for a sick visit. Feel free to contact us anytime with any questions, problems, or concerns.  It was a pleasure to see you again today!  Websites that have reliable patient information: 1. American Academy of Asthma, Allergy, and Immunology: www.aaaai.org 2. Food Allergy Research and Education (FARE): foodallergy.org 3. Mothers of Asthmatics: http://www.asthmacommunitynetwork.org 4. American College of Allergy, Asthma, and Immunology: www.acaai.org   COVID-19 Vaccine Information can be found at:  [email or web address] For questions related to vaccine distribution or appointments, please email [email or web address] or call [phone].   We realize that you might be concerned about having an allergic reaction to the COVID19 vaccines. To help with that concern, WE ARE OFFERING THE COVID19 VACCINES IN OUR OFFICE! Ask the front desk for dates!     "Like" [place] on Facebook and Instagram for our latest updates!      A healthy democracy works best when [employer] participate! Make sure you are registered to vote! If you have moved or changed any of your contact information, you will need to get this updated before voting!  In some cases, you MAY be able to register to vote online: [email or web address]     Airborne Adult Perc - 12/12/20 1403    Time Antigen Placed 1403    Allergen Manufacturer Waynette Buttery    Location Back    Number of Test 59    1. Control-Buffer 50% Glycerol Negative    2. Control-Histamine 1 mg/ml 2+    3. Albumin saline Negative    4. Bahia Negative    5. [place] Territ[place] Negative    6. Johnson Negative    7. Kentucky Blue Negative    8. Meadow Fescue Negative    9. Perennial Rye Negative    10. Sweet Vernal Negative    11. Timothy Negative    12. Cocklebur Negative    13. Burweed Marshelder Negative    14. Ragweed, short Negative    15. Ragweed, Giant Negative    16. Plantain,  English Negative    17. Lamb's Quarters Negative    18. Sheep Sorrell Negative    19. Rough Pigweed Negative    20. [place],  Rough Negative    21. Mugwort, Common Negative    22. Ash mix Negative    23. Birch mix Negative    24. Beech American Negative    25. Box, Elder Negative    26. Cedar, red 4+    27. Cottonwood, Guinea-Bissau Negative    28. Elm mix Negative    29. Hickory Negative    30. Maple mix Negative    31. Oak, [place] mix Negative    32. Pecan Pollen Negative    33. Pine mix Negative     34. Sycamore Eastern Negative    35. Walnut, Black Pollen Negative    36. Alternaria alternata Negative    37. Cladosporium Herbarum Negative    38. Aspergillus mix Negative    39. Penicillium mix Negative    40. Bipolaris sorokiniana (Helminthosporium) Negative    41. Drechslera spicifera (Curvularia) Negative    42. Mucor plumbeus Negative    43. Fusarium moniliforme Negative    44. Aureobasidium pullulans (pullulara) Negative    45. Rhizopus oryzae Negative    46. Botrytis cinera Negative    47. Epicoccum nigrum Negative    48. Phoma betae Negative    49. Candida Albicans Negative    50. Trichophyton mentagrophytes Negative    51. Mite, D Farinae  5,000 AU/ml Negative    52. Mite, D Pteronyssinus  5,000 AU/ml Negative    53. Cat Hair 10,000 BAU/ml Negative    54.  Dog Epithelia Negative    55. Mixed Feathers Negative    56. Horse Epithelia Negative    57. Cockroach, German Negative    58. Mouse Negative    59. Tobacco Leaf Negative          Intradermal - 12/12/20 1509    Time Antigen Placed 1450    Allergen Manufacturer Waynette Buttery    Location Arm    Number of Test 15    Intradermal Select    Control Negative    [place] Negative    Johnson Negative    7 Grass 4+    Ragweed mix Negative    Weed mix Negative    Tree mix 2+    Mold 1 Negative    Mold 2 Negative    Mold 3 Negative    Mold 4 Negative    Cat Negative    Dog Negative    Cockroach Negative    Mite mix Negative            Reducing Pollen Exposure  The American Academy of Allergy, Asthma and Immunology suggests the following steps to reduce your exposure to pollen during allergy seasons.    1. Do not hang sheets or clothing out to dry; pollen may collect on these items. 2. Do not mow lawns or spend time around freshly cut grass; mowing stirs up pollen. 3. Keep windows closed at night.  Keep car windows closed while driving. 4. Minimize morning activities outdoors, a time when pollen counts are  usually at their highest. 5. Stay indoors as much as possible when pollen counts or humidity is high and on windy days when pollen tends to remain in the air longer. 6. Use air conditioning when possible.  Many air conditioners have filters that trap the pollen spores. 7. Use a HEPA room air filter to remove pollen form the indoor air you breathe.

## 2020-12-14 ENCOUNTER — Encounter: Payer: Self-pay | Admitting: Allergy & Immunology

## 2020-12-16 ENCOUNTER — Encounter: Payer: Self-pay | Admitting: Internal Medicine

## 2020-12-16 DIAGNOSIS — J3089 Other allergic rhinitis: Secondary | ICD-10-CM

## 2020-12-16 NOTE — Progress Notes (Signed)
Aeroallergen Immunotherapy    Patient Details  Name: Tracey Escobar  MRN: [id number]  Date of Birth: [birth year]   Order 1 of 1   Vial Label: G/T/DM   0.4 ml (Volume) BAU Concentration -- 7 Grass Mix* 100,000 ([address] [place], [place], [place], Perennial Rye, RedTop, Sweet Vernal, Timothy)  0.6 ml (Volume) 1:20 Concentration -- Eastern 10 Tree Mix (also Sweet Gum)  0.7 ml (Volume)  AU Concentration -- Mite Mix (DF 5,000 & DP 5,000)    1.7 ml Extract Subtotal  3.3 ml Diluent  5.0 ml Maintenance Total    Final Concentration above is stated in weight/volume (wt/vol). Allergen units (AU/ml) biological units (BAU/ml). The total volume is 5 ml.    Schedule: B   Special Instructions: none

## 2020-12-16 NOTE — Progress Notes (Signed)
VIALS EXP 12-16-21

## 2020-12-19 ENCOUNTER — Other Ambulatory Visit: Payer: Self-pay

## 2020-12-19 ENCOUNTER — Ambulatory Visit
Admission: RE | Admit: 2020-12-19 | Discharge: 2020-12-19 | Disposition: A | Discharge status: Home / Self Care | Payer: 59 | Source: Ambulatory Visit | Attending: Internal Medicine | Admitting: Internal Medicine

## 2020-12-19 DIAGNOSIS — N644 Mastodynia: Secondary | ICD-10-CM

## 2020-12-20 ENCOUNTER — Telehealth: Payer: Self-pay | Admitting: Allergy & Immunology

## 2020-12-20 MED ORDER — OLOPATADINE HCL 0.2 % OP SOLN: OPHTHALMIC | 5 refills | Status: DC

## 2020-12-20 NOTE — Telephone Encounter (Signed)
Well, dang. We can try Pazeo one drop per eye daily or Pataday one drop per eye twice daily.   Or she could just purchase over the counter.  Malachi Bonds, MD [facility]

## 2020-12-20 NOTE — Telephone Encounter (Signed)
Pt. Was prescribed lastocaft  The pharmacy no longer carries it, asking for a sub for this medicine.

## 2020-12-20 NOTE — Telephone Encounter (Signed)
Please advise. (looks like it just went OTC this week)

## 2020-12-20 NOTE — Telephone Encounter (Signed)
Patient informed.  Will send in the Pataday and see if it is covered.  If not she cannot get it through insurance she will try OTC.

## 2020-12-21 ENCOUNTER — Other Ambulatory Visit: Payer: Self-pay | Admitting: Internal Medicine

## 2020-12-23 ENCOUNTER — Telehealth: Payer: Self-pay

## 2020-12-23 NOTE — Telephone Encounter (Signed)
[facility] pharmacy is unable to get lastacaft 0.25% eye drops please advise to change in eye drop

## 2020-12-25 ENCOUNTER — Ambulatory Visit (INDEPENDENT_AMBULATORY_CARE_PROVIDER_SITE_OTHER): Payer: 59

## 2020-12-25 ENCOUNTER — Other Ambulatory Visit: Payer: Self-pay | Admitting: Allergy & Immunology

## 2020-12-25 ENCOUNTER — Other Ambulatory Visit: Payer: Self-pay

## 2020-12-25 DIAGNOSIS — J309 Allergic rhinitis, unspecified: Secondary | ICD-10-CM

## 2020-12-25 MED ORDER — OLOPATADINE HCL 0.2 % OP SOLN: 1.0000 [drp] | Freq: Every day | OPHTHALMIC | 5 refills | Status: DC | PRN

## 2020-12-25 NOTE — Telephone Encounter (Signed)
Olopatadine 2% is preferred with her insurance so I have sent that in

## 2020-12-25 NOTE — Telephone Encounter (Signed)
Sorry, I thought I wrote to try Pataday twice daily or Pazeo 1 drop per eye daily.  Malachi Bonds, MD [facility]

## 2020-12-25 NOTE — Progress Notes (Signed)
Immunotherapy   Patient Details  Name: Tracey Escobar MRN: [id number] Date of Birth: [birth year]  12/25/2020  [name] pt here to restart allergy injections blue vial Following schedule: b  Frequency:weekly Epi-Pen:yes Consent signed and patient instructions given.   Berna Bue 12/25/2020, 3:30 PM

## 2020-12-25 NOTE — Addendum Note (Signed)
Addended by: Berna Bue on: 12/25/2020 10:55 AM   Modules accepted: Orders

## 2020-12-26 ENCOUNTER — Encounter: Payer: Self-pay | Admitting: Internal Medicine

## 2020-12-26 ENCOUNTER — Telehealth: Payer: Self-pay

## 2020-12-26 NOTE — Telephone Encounter (Signed)
Pt stated that it was not a rush to get back to her, she would rather talk to Dr. Allyne Gee about some things. She did not go into detail, and did not want to leave a message when call returned.

## 2020-12-27 ENCOUNTER — Telehealth: Payer: Self-pay

## 2020-12-27 NOTE — Telephone Encounter (Signed)
The pt was notified that she needed an appt to discuss why she needed to  be taken out of work.

## 2021-01-01 ENCOUNTER — Ambulatory Visit (INDEPENDENT_AMBULATORY_CARE_PROVIDER_SITE_OTHER): Payer: 59 | Admitting: Nurse Practitioner

## 2021-01-01 ENCOUNTER — Other Ambulatory Visit: Payer: Self-pay

## 2021-01-01 VITALS — BP 132/78 | HR 91 | Temp 98.8°F | Ht 67.0 in | Wt 301.0 lb

## 2021-01-01 DIAGNOSIS — F419 Anxiety disorder, unspecified: Secondary | ICD-10-CM

## 2021-01-01 DIAGNOSIS — Z79899 Other long term (current) drug therapy: Secondary | ICD-10-CM | POA: Diagnosis not present

## 2021-01-01 MED ORDER — CYANOCOBALAMIN 1000 MCG/ML IJ SOLN: 1000.0000 ug | Freq: Once | INTRAMUSCULAR | Status: AC

## 2021-01-01 NOTE — Progress Notes (Signed)
I,Tianna Badgett,acting as a Neurosurgeon for [employer], NP.,have documented all relevant documentation on the behalf of [employer], NP,as directed by  Charlesetta Ivory, NP while in the presence of Charlesetta Ivory, NP.  This visit occurred during the SARS-CoV-2 public health emergency.  Safety protocols were in place, including screening questions prior to the visit, additional usage of staff PPE, and extensive cleaning of exam room while observing appropriate contact time as indicated for disinfecting solutions.  Subjective:     Patient ID: Tracey Escobar , female    DOB: Dec 05, 1970 , 50 y.o.   MRN: [id number]   Chief Complaint  Patient presents with  . Stress    HPI  Patient is here for evaluation for stress from work. Lately the patient has been going through a lot of stress. The stress and anxiety only happens at work. Otherwise she does not have any issues with everyday life. She also just saw her cardiologist this morning who has started her on a new medication. She would like do something about her stress or talk to someone about it.  She stated she has never felt this way ever before in her life and knows it is related to her work environment. She also feels she is need for a mental break from her work to figure out what is best for her.     Past Medical History:  Diagnosis Date  . Arthritis   . Asthma   . Atrial fibrillation (HCC)   . Dysrhythmia    A-Fib  . GERD (gastroesophageal reflux disease)   . Hypertension   . Irritable bowel   . Pneumonia   . Sleep apnea   . Sleep apnea   . Vitamin D deficiency      Family History  Problem Relation Age of Onset  . Hyperlipidemia Mother   . Hypertension Mother   . Hyperlipidemia Father   . Hypertension Father   . COPD Father   . Heart failure Father   . Diabetes Brother   . Hypertension Maternal Grandmother   . Seizures Maternal Grandmother   . Cancer Paternal Grandfather   . Cancer Brother   . Allergic  rhinitis Neg Hx   . Angioedema Neg Hx   . Asthma Neg Hx   . Eczema Neg Hx   . Immunodeficiency Neg Hx   . Urticaria Neg Hx      Current Outpatient Medications:  .  flecainide (TAMBOCOR) 100 MG tablet, Take by mouth., Disp: , Rfl:  .  azelastine (ASTELIN) 0.1 % nasal spray, Place 1 spray into both nostrils 2 (two) times daily., Disp: 30 mL, Rfl: 1 .  Budeson-Glycopyrrol-Formoterol (BREZTRI AEROSPHERE) 160-9-4.8 MCG/ACT AERO, Inhale 2 sprays into the lungs in the morning and at bedtime. 2 puffs 2 times per day, Disp: 10.7 g, Rfl: 5 .  Cholecalciferol (VITAMIN D3) 5000 UNITS TABS, Take 1 tablet by mouth daily., Disp: , Rfl:  .  EPINEPHrine (EPIPEN 2-PAK) 0.3 mg/0.3 mL IJ SOAJ injection, Use as directed for severe allergic reactions, Disp: 2 each, Rfl: 3 .  Estradiol 10 MCG TABS vaginal tablet, estradiol 10 mcg vaginal tablet, Disp: , Rfl:  .  fluticasone (FLONASE) 50 MCG/ACT nasal spray, SHAKE LIQUID AND USE 2 SPRAYS IN EACH NOSTRIL EVERY DAY FOR NASAL CONGESTION, Disp: 16 g, Rfl: 0 .  hydrochlorothiazide (HYDRODIURIL) 12.5 MG tablet, TAKE 1 TABLET(12.5 MG) BY MOUTH DAILY, Disp: 90 tablet, Rfl: 0 .  levocetirizine (XYZAL) 5 MG tablet, TAKE 1 TABLET BY MOUTH EVERY  DAY IN THE EVENING (Patient not taking: Reported on 12/12/2020), Disp: 90 tablet, Rfl: 2 .  LINZESS 290 MCG CAPS capsule, TAKE 1 CAPSULE BY MOUTH EVERY DAY 30 MINUTES BEFORE FIRST MEAL OF THE DAY ON AN EMPTY STOMACH, Disp: 90 capsule, Rfl: 2 .  losartan (COZAAR) 25 MG tablet, TAKE 1 TABLET(25 MG) BY MOUTH DAILY, Disp: 90 tablet, Rfl: 0 .  LUMIFY 0.025 % SOLN, SMARTSIG:1 Drop(s) In Eye(s) Every 12 Hours PRN, Disp: , Rfl:  .  medroxyPROGESTERone (DEPO-PROVERA) 150 MG/ML injection, medroxyprogesterone 150 mg/mL intramuscular suspension  Inject 1 mL every 3 months by intramuscular route around the clock., Disp: , Rfl:  .  metoprolol succinate (TOPROL-XL) 100 MG 24 hr tablet, Take 100 mg by mouth daily. , Disp: , Rfl:  .  montelukast  (SINGULAIR) 10 MG tablet, TAKE 1 TABLET(10 MG) BY MOUTH AT BEDTIME, Disp: 90 tablet, Rfl: 1 .  Olopatadine HCl 0.2 % SOLN, Can use one drop in each eye twice daily if needed., Disp: 2.5 mL, Rfl: 5 .  Olopatadine HCl 0.2 % SOLN, Place 1 drop into both eyes daily as needed., Disp: 2.5 mL, Rfl: 5 .  pantoprazole (PROTONIX) 40 MG tablet, TAKE 1 TABLET(40 MG) BY MOUTH DAILY, Disp: 90 tablet, Rfl: 1 .  RESTASIS 0.05 % ophthalmic emulsion, , Disp: , Rfl:  .  rivaroxaban (XARELTO) 20 MG TABS tablet, TAKE 1 TABLET(20 MG) BY MOUTH WITH SUPPER, Disp: , Rfl:  .  Suvorexant (BELSOMRA) 20 MG TABS, One tab po qhs prn, Disp: 30 tablet, Rfl: 2  Current Facility-Administered Medications:  .  cyanocobalamin ((VITAMIN B-12)) injection 1,000 mcg, 1,000 mcg, Intramuscular, Once, Loriene Taunton, NP   No Known Allergies   Review of Systems  Constitutional: Negative.  Negative for chills and fatigue.  HENT: Negative for rhinorrhea.   Respiratory: Negative.  Negative for chest tightness, shortness of breath and wheezing.   Cardiovascular: Negative.  Negative for chest pain and palpitations.  Gastrointestinal: Negative.   Endocrine: Negative for polydipsia, polyphagia and polyuria.  Neurological: Negative.   Psychiatric/Behavioral: Positive for agitation. Negative for sleep disturbance and suicidal ideas. The patient is not nervous/anxious.        Stress and agitation related to work.      Today's Vitals   01/01/21 1518  BP: 132/78  Pulse: 91  Temp: 98.8 F (37.1 C)  TempSrc: Oral  Weight: (!) 301 lb (136.5 kg)  Height: 5\' 7"  (1.702 m)   Body mass index is 47.14 kg/m.   Objective:  Physical Exam Constitutional:      Appearance: Normal appearance. She is obese.  HENT:     Head: Normocephalic and atraumatic.  Cardiovascular:     Rate and Rhythm: Normal rate and regular rhythm.     Pulses: Normal pulses.     Heart sounds: No murmur heard.   Pulmonary:     Effort: Pulmonary effort is normal.  No respiratory distress.     Breath sounds: Normal breath sounds. No wheezing.  Skin:    General: Skin is warm and dry.     Capillary Refill: Capillary refill takes less than 2 seconds.  Neurological:     General: No focal deficit present.     Mental Status: She is alert and oriented to person, place, and time.  Psychiatric:     Comments: Tearful         Assessment And Plan:     1. Anxiety -Patient is here today because she is very stressed out with  work. She feels like that that her work environment is not healthy.  -patient declines to take medication at this time.  -Advised patient to make an appt with the therapist she was previously seeing -Encouraged patient to try to exercise,do other relaxation activities such as mediation and massage to help with her stress.  -Encouraged patient to take time off from work and reevaluate her options for her overall well being.   2. Drug therapy - cyanocobalamin ((VITAMIN B-12)) injection 1,000 mcg here    Patient was given opportunity to ask questions. Patient verbalized understanding of the plan and was able to repeat key elements of the plan. All questions were answered to their satisfaction.  Rickita Forstner DNP   I, Charlesetta Ivory NP, have reviewed all documentation for this visit. The documentation on 01/01/2021  for the exam, diagnosis, procedures, and orders are all accurate and complete.    IF YOU HAVE BEEN REFERRED TO A SPECIALIST, IT MAY TAKE 1-2 WEEKS TO SCHEDULE/PROCESS THE REFERRAL. IF YOU HAVE NOT HEARD FROM US/SPECIALIST IN TWO WEEKS, PLEASE GIVE [place] A CALL AT [phone].   THE PATIENT IS ENCOURAGED TO PRACTICE SOCIAL DISTANCING DUE TO THE COVID-19 PANDEMIC.

## 2021-01-01 NOTE — Patient Instructions (Signed)
Textbook of family medicine (9th ed., pp. 1062-1073). [place], [place]: [employer].">  Stress, Adult Stress is a normal reaction to life events. Stress is what you feel when life demands more than you are used to, or more than you think you can handle. Some stress can be useful, such as studying for a test or meeting a deadline at work. Stress that occurs too often or for too long can cause problems. It can affect your emotional health and interfere with relationships and normal daily activities. Too much stress can weaken your body's defense system (immune system) and increase your risk for physical illness. If you already have a medical problem, stress can make it worse. What are the causes? All sorts of life events can cause stress. An event that causes stress for one person may not be stressful for another person. Major life events, whether positive or negative, commonly cause stress. Examples include:  Losing a job or starting a new job.  Losing a loved one.  Moving to a new town or home.  Getting married or divorced.  Having a baby.  Getting injured or sick. Less obvious life events can also cause stress, especially if they occur day after day or in combination with each other. Examples include:  Working long hours.  Driving in traffic.  Caring for children.  Being in debt.  Being in a difficult relationship. What are the signs or symptoms? Stress can cause emotional symptoms, including:  Anxiety. This is feeling worried, afraid, on edge, overwhelmed, or out of control.  Anger, including irritation or impatience.  Depression. This is feeling sad, down, helpless, or guilty.  Trouble focusing, remembering, or making decisions. Stress can cause physical symptoms, including:  Aches and pains. These may affect your head, neck, back, stomach, or other areas of your body.  Tight muscles or a clenched jaw.  Low energy.  Trouble sleeping. Stress can cause unhealthy  behaviors, including:  Eating to feel better (overeating) or skipping meals.  Working too much or putting off tasks.  Smoking, drinking alcohol, or using drugs to feel better. How is this diagnosed? Stress is diagnosed through an assessment by your health care provider. He or she may diagnose this condition based on:  Your symptoms and any stressful life events.  Your medical history.  Tests to rule out other causes of your symptoms. Depending on your condition, your health care provider may refer you to a specialist for further evaluation. How is this treated? Stress management techniques are the recommended treatment for stress. Medicine is not typically recommended for the treatment of stress. Techniques to reduce your reaction to stressful life events include:  Stress identification. Monitor yourself for symptoms of stress and identify what causes stress for you. These skills may help you to avoid or prepare for stressful events.  Time management. Set your priorities, keep a calendar of events, and learn to say no. Taking these actions can help you avoid making too many commitments. Techniques for coping with stress include:  Rethinking the problem. Try to think realistically about stressful events rather than ignoring them or overreacting. Try to find the positives in a stressful situation rather than focusing on the negatives.  Exercise. Physical exercise can release both physical and emotional tension. The key is to find a form of exercise that you enjoy and do it regularly.  Relaxation techniques. These relax the body and mind. The key is to find one or more that you enjoy and use the techniques regularly. Examples include: ?   Meditation, deep breathing, or progressive relaxation techniques. ? Yoga or tai chi. ? Biofeedback, mindfulness techniques, or journaling. ? Listening to music, being out in nature, or participating in other hobbies.  Practicing a healthy lifestyle.  Eat a balanced diet, drink plenty of water, limit or avoid caffeine, and get plenty of sleep.  Having a strong support network. Spend time with family, friends, or other people you enjoy being around. Express your feelings and talk things over with someone you trust. Counseling or talk therapy with a mental health professional may be helpful if you are having trouble managing stress on your own.   Follow these instructions at home: Lifestyle  Avoid drugs.  Do not use any products that contain nicotine or tobacco, such as cigarettes, e-cigarettes, and chewing tobacco. If you need help quitting, ask your health care provider.  Limit alcohol intake to no more than 1 drink a day for nonpregnant women and 2 drinks a day for men. One drink equals 12 oz of beer, 5 oz of wine, or 1 oz of hard liquor  Do not use alcohol or drugs to relax.  Eat a balanced diet that includes fresh fruits and vegetables, whole grains, lean meats, fish, eggs, and beans, and low-fat dairy. Avoid processed foods and foods high in added fat, sugar, and salt.  Exercise at least 30 minutes on 5 or more days each week.  Get 7-8 hours of sleep each night.   General instructions  Practice stress management techniques as discussed with your health care provider.  Drink enough fluid to keep your urine clear or pale yellow.  Take over-the-counter and prescription medicines only as told by your health care provider.  Keep all follow-up visits as told by your health care provider. This is important.   Contact a health care provider if:  Your symptoms get worse.  You have new symptoms.  You feel overwhelmed by your problems and can no longer manage them on your own. Get help right away if:  You have thoughts of hurting yourself or others. If you ever feel like you may hurt yourself or others, or have thoughts about taking your own life, get help right away. You can go to your nearest emergency department or  call:  Your local emergency services (911 in the [place]  A suicide crisis helpline, such as the National Suicide Prevention Lifeline at 1-800-273-8255. This is open 24 hours a day. Summary  Stress is a normal reaction to life events. It can cause problems if it happens too often or for too long.  Practicing stress management techniques is the best way to treat stress.  Counseling or talk therapy with a mental health professional may be helpful if you are having trouble managing stress on your own. This information is not intended to replace advice given to you by your health care provider. Make sure you discuss any questions you have with your health care provider. Document Revised: 06/07/2020 Document Reviewed: 06/07/2020 Elsevier Patient Education  2021 [employer] Inc.  

## 2021-01-03 ENCOUNTER — Ambulatory Visit (INDEPENDENT_AMBULATORY_CARE_PROVIDER_SITE_OTHER): Payer: 59

## 2021-01-03 DIAGNOSIS — J309 Allergic rhinitis, unspecified: Secondary | ICD-10-CM

## 2021-01-10 ENCOUNTER — Ambulatory Visit (INDEPENDENT_AMBULATORY_CARE_PROVIDER_SITE_OTHER): Payer: 59

## 2021-01-10 ENCOUNTER — Other Ambulatory Visit: Payer: Self-pay | Admitting: Internal Medicine

## 2021-01-10 DIAGNOSIS — J309 Allergic rhinitis, unspecified: Secondary | ICD-10-CM | POA: Diagnosis not present

## 2021-01-15 ENCOUNTER — Ambulatory Visit (INDEPENDENT_AMBULATORY_CARE_PROVIDER_SITE_OTHER): Payer: 59 | Admitting: *Deleted

## 2021-01-15 DIAGNOSIS — J309 Allergic rhinitis, unspecified: Secondary | ICD-10-CM | POA: Diagnosis not present

## 2021-01-22 ENCOUNTER — Other Ambulatory Visit: Payer: Self-pay | Admitting: Allergy & Immunology

## 2021-01-22 NOTE — Telephone Encounter (Signed)
Please advise to flonase as the pts last avs did not mention it

## 2021-01-30 ENCOUNTER — Ambulatory Visit: Payer: 59 | Admitting: Internal Medicine

## 2021-01-30 NOTE — Telephone Encounter (Signed)
If she wants that, I am fine with it.  Malachi Bonds, MD [facility]

## 2021-01-31 ENCOUNTER — Ambulatory Visit (INDEPENDENT_AMBULATORY_CARE_PROVIDER_SITE_OTHER): Payer: 59

## 2021-01-31 ENCOUNTER — Other Ambulatory Visit: Payer: Self-pay

## 2021-01-31 DIAGNOSIS — J309 Allergic rhinitis, unspecified: Secondary | ICD-10-CM | POA: Diagnosis not present

## 2021-01-31 MED ORDER — LASTACAFT 0.25 % OP SOLN: OPHTHALMIC | 5 refills | Status: DC

## 2021-01-31 NOTE — Telephone Encounter (Signed)
Sent in lastacaft  Eye drop. Put in pharmacy note to have where pt. Can use her flex spending card to purchase the lastacaft even though it's over the counter.

## 2021-02-09 ENCOUNTER — Other Ambulatory Visit: Payer: Self-pay | Admitting: Internal Medicine

## 2021-02-21 ENCOUNTER — Telehealth: Payer: Self-pay

## 2021-02-21 ENCOUNTER — Ambulatory Visit (INDEPENDENT_AMBULATORY_CARE_PROVIDER_SITE_OTHER): Payer: 59

## 2021-02-21 DIAGNOSIS — J309 Allergic rhinitis, unspecified: Secondary | ICD-10-CM | POA: Diagnosis not present

## 2021-02-21 DIAGNOSIS — J4540 Moderate persistent asthma, uncomplicated: Secondary | ICD-10-CM

## 2021-02-21 MED ORDER — PREDNISONE 10 MG PO TABS: ORAL_TABLET | ORAL | 0 refills | Status: DC

## 2021-02-21 NOTE — Addendum Note (Signed)
Addended by: Alfonse Spruce on: 02/21/2021 05:02 PM   Modules accepted: Orders

## 2021-02-21 NOTE — Telephone Encounter (Signed)
Patient states, that Tracey Escobar is not working well for her asthma. Still having S.O.B, wheezing and coughing. She states," it's like she isn't taking anything at all". Would like something else called in for her asthma. Is also asking if Dupixent would help her. Please advise.

## 2021-02-21 NOTE — Telephone Encounter (Signed)
She would need labs to determine if she can get Dupixent or another biologic.  I did order these.  She can come in to get them next week or go to a [facility] over the weekend.  I sent in a short burst of prednisone to help with the coughing.  Please inform the patient.  Malachi Bonds, MD [facility]

## 2021-02-24 NOTE — Telephone Encounter (Signed)
Called and left a detailed message for patient informing her that she needed labs drawn and to notify her that Dr. Dellis Anes has sent in prednisone to her pharmacy for pick up to help with her coughing. I informed patient that the lab requisition form as well as a list of [facility] facilities have been sent to her home and we do have a copy in office if she chooses to come to the [place] office to have her labs drawn.

## 2021-02-26 ENCOUNTER — Encounter: Payer: Self-pay | Admitting: Internal Medicine

## 2021-02-27 ENCOUNTER — Ambulatory Visit: Payer: 59 | Admitting: Internal Medicine

## 2021-02-28 MED ORDER — TRELEGY ELLIPTA 200-62.5-25 MCG/INH IN AEPB: 1.0000 | INHALATION_SPRAY | Freq: Every day | RESPIRATORY_TRACT | 5 refills | Status: DC

## 2021-02-28 NOTE — Telephone Encounter (Signed)
Left a detailed message informing patient of Dr. Ellouise Newer recommendation. Informed patient to contact the office if she has any questions or concerns.

## 2021-02-28 NOTE — Telephone Encounter (Signed)
Attempted to call patient. Left a voicemail asking for patient to return call to discuss.

## 2021-02-28 NOTE — Telephone Encounter (Signed)
Spoke with patient, she stated that she will go to the [place] office Tuesday 03/04/2021 to get her labs drawn. Patient is concerned with her breathing as she is having some shortness of breath. She stated that she is not using any inhalers at this time as she feels they are not helping. She stated that the [facility] was like a breath of fresh air when she first started using it but now she cant even tell she is using an inhaler and has since stopped. Patient is wondering what other inhaler she can use. Please advise.

## 2021-02-28 NOTE — Telephone Encounter (Signed)
We could try Trelegy one puff once daily instead. There is even a copay card to use if needed.  Malachi Bonds, MD [facility]

## 2021-02-28 NOTE — Addendum Note (Signed)
Addended by: Dub Mikes on: 02/28/2021 05:08 PM   Modules accepted: Orders

## 2021-03-06 ENCOUNTER — Ambulatory Visit: Payer: 59 | Admitting: Internal Medicine

## 2021-03-10 LAB — CBC WITH DIFFERENTIAL/PLATELET
Basophils Absolute: 0.1 10*3/uL (ref 0.0–0.2)
Basos: 1 %
EOS (ABSOLUTE): 0.2 10*3/uL (ref 0.0–0.4)
Eos: 3 %
Hematocrit: 41 % (ref 34.0–46.6)
Hemoglobin: 13.1 g/dL (ref 11.1–15.9)
Immature Grans (Abs): 0 10*3/uL (ref 0.0–0.1)
Immature Granulocytes: 0 %
Lymphocytes Absolute: 2.2 10*3/uL (ref 0.7–3.1)
Lymphs: 35 %
MCH: 28.4 pg (ref 26.6–33.0)
MCHC: 32 g/dL (ref 31.5–35.7)
MCV: 89 fL (ref 79–97)
Monocytes Absolute: 0.3 10*3/uL (ref 0.1–0.9)
Monocytes: 6 %
Neutrophils Absolute: 3.4 10*3/uL (ref 1.4–7.0)
Neutrophils: 55 %
Platelets: 345 10*3/uL (ref 150–450)
RBC: 4.61 x10E6/uL (ref 3.77–5.28)
RDW: 13.6 % (ref 11.7–15.4)
WBC: 6.2 10*3/uL (ref 3.4–10.8)

## 2021-03-10 LAB — ANCA TITERS
Atypical pANCA: <1:20 {titer}
C-ANCA: <1:20 {titer}
P-ANCA: <1:20 {titer}

## 2021-03-10 LAB — ALPHA-1-ANTITRYPSIN: A-1 Antitrypsin: 168 mg/dL (ref 101–187)

## 2021-03-10 LAB — ASPERGILLUS PRECIPITINS
A.Fumigatus #1 Abs: NEGATIVE
Aspergillus Flavus Antibodies: NEGATIVE
Aspergillus Niger Antibodies: NEGATIVE
Aspergillus glaucus IgG: NEGATIVE
Aspergillus nidulans IgG: NEGATIVE
Aspergillus terreus IgG: NEGATIVE

## 2021-03-10 LAB — IGE: IgE (Immunoglobulin E), Serum: 19 IU/mL (ref 6–495)

## 2021-03-16 ENCOUNTER — Encounter: Payer: Self-pay | Admitting: Allergy & Immunology

## 2021-03-17 DIAGNOSIS — Z3042 Encounter for surveillance of injectable contraceptive: Secondary | ICD-10-CM | POA: Diagnosis not present

## 2021-03-20 ENCOUNTER — Telehealth: Payer: Self-pay | Admitting: *Deleted

## 2021-03-20 NOTE — Telephone Encounter (Signed)
L/M for patient to discuss Nucala

## 2021-03-20 NOTE — Telephone Encounter (Signed)
Patient interested in [place]. L/M for patient to contact me to discuss. Will need Ins info if sha has any

## 2021-03-24 NOTE — Telephone Encounter (Signed)
Called patient and advised approval, copay card and submit to [facility] for Jani Files she wants delivered to office

## 2021-03-25 DIAGNOSIS — Z7951 Long term (current) use of inhaled steroids: Secondary | ICD-10-CM | POA: Diagnosis not present

## 2021-03-25 DIAGNOSIS — I10 Essential (primary) hypertension: Secondary | ICD-10-CM | POA: Diagnosis not present

## 2021-03-25 DIAGNOSIS — K219 Gastro-esophageal reflux disease without esophagitis: Secondary | ICD-10-CM | POA: Diagnosis not present

## 2021-03-25 DIAGNOSIS — R002 Palpitations: Secondary | ICD-10-CM | POA: Diagnosis not present

## 2021-03-25 DIAGNOSIS — Z79899 Other long term (current) drug therapy: Secondary | ICD-10-CM | POA: Diagnosis not present

## 2021-03-25 DIAGNOSIS — I480 Paroxysmal atrial fibrillation: Secondary | ICD-10-CM | POA: Diagnosis not present

## 2021-03-25 DIAGNOSIS — E876 Hypokalemia: Secondary | ICD-10-CM | POA: Diagnosis not present

## 2021-03-25 DIAGNOSIS — R079 Chest pain, unspecified: Secondary | ICD-10-CM | POA: Diagnosis not present

## 2021-03-25 DIAGNOSIS — J45909 Unspecified asthma, uncomplicated: Secondary | ICD-10-CM | POA: Diagnosis not present

## 2021-03-26 DIAGNOSIS — I480 Paroxysmal atrial fibrillation: Secondary | ICD-10-CM | POA: Diagnosis not present

## 2021-03-28 ENCOUNTER — Telehealth: Payer: Self-pay | Admitting: Allergy & Immunology

## 2021-03-28 ENCOUNTER — Ambulatory Visit (INDEPENDENT_AMBULATORY_CARE_PROVIDER_SITE_OTHER): Payer: BC Managed Care – PPO

## 2021-03-28 DIAGNOSIS — J309 Allergic rhinitis, unspecified: Secondary | ICD-10-CM | POA: Diagnosis not present

## 2021-03-28 MED ORDER — LEVOCETIRIZINE DIHYDROCHLORIDE 5 MG PO TABS: ORAL_TABLET | ORAL | 0 refills | Status: DC

## 2021-03-28 NOTE — Telephone Encounter (Signed)
Refill for levocetirizine x 3 with no refills sent to [facility].

## 2021-03-28 NOTE — Telephone Encounter (Signed)
Pt request prescription for levocetirizine

## 2021-04-01 ENCOUNTER — Other Ambulatory Visit: Payer: Self-pay | Admitting: Internal Medicine

## 2021-04-02 ENCOUNTER — Other Ambulatory Visit: Payer: Self-pay

## 2021-04-02 ENCOUNTER — Ambulatory Visit (INDEPENDENT_AMBULATORY_CARE_PROVIDER_SITE_OTHER): Payer: BC Managed Care – PPO

## 2021-04-02 DIAGNOSIS — J4550 Severe persistent asthma, uncomplicated: Secondary | ICD-10-CM

## 2021-04-02 DIAGNOSIS — J4540 Moderate persistent asthma, uncomplicated: Secondary | ICD-10-CM

## 2021-04-02 MED ORDER — MEPOLIZUMAB 100 MG ~~LOC~~ SOLR
100.0000 mg | SUBCUTANEOUS | Status: DC
  Administered 2021-04-02 – 2023-02-09 (×24): 100 mg via SUBCUTANEOUS

## 2021-04-12 ENCOUNTER — Other Ambulatory Visit: Payer: Self-pay | Admitting: Internal Medicine

## 2021-04-14 ENCOUNTER — Telehealth: Payer: Self-pay

## 2021-04-14 ENCOUNTER — Other Ambulatory Visit: Payer: Self-pay | Admitting: Internal Medicine

## 2021-04-14 NOTE — Telephone Encounter (Signed)
Pa submitted thru cover my meds for protonix waiting on response

## 2021-04-16 ENCOUNTER — Ambulatory Visit: Payer: BC Managed Care – PPO

## 2021-04-17 ENCOUNTER — Telehealth: Payer: Self-pay | Admitting: Allergy & Immunology

## 2021-04-17 MED ORDER — LEVOCETIRIZINE DIHYDROCHLORIDE 5 MG PO TABS: ORAL_TABLET | ORAL | 1 refills | Status: DC

## 2021-04-17 NOTE — Telephone Encounter (Signed)
Pt request prescriptiion  for Xyzal

## 2021-04-17 NOTE — Telephone Encounter (Signed)
Refills sent to [facility] 

## 2021-04-30 ENCOUNTER — Ambulatory Visit (INDEPENDENT_AMBULATORY_CARE_PROVIDER_SITE_OTHER): Payer: BC Managed Care – PPO

## 2021-04-30 ENCOUNTER — Other Ambulatory Visit: Payer: Self-pay

## 2021-04-30 DIAGNOSIS — J4550 Severe persistent asthma, uncomplicated: Secondary | ICD-10-CM

## 2021-04-30 MED ORDER — LEVOCETIRIZINE DIHYDROCHLORIDE 5 MG PO TABS: ORAL_TABLET | ORAL | 0 refills | Status: DC

## 2021-05-08 HISTORY — PX: WRIST SURGERY: SHX841

## 2021-05-14 ENCOUNTER — Other Ambulatory Visit: Payer: Self-pay | Admitting: Internal Medicine

## 2021-05-15 ENCOUNTER — Ambulatory Visit: Payer: Self-pay | Admitting: Nurse Practitioner

## 2021-05-16 ENCOUNTER — Ambulatory Visit (INDEPENDENT_AMBULATORY_CARE_PROVIDER_SITE_OTHER): Payer: BC Managed Care – PPO

## 2021-05-16 DIAGNOSIS — J309 Allergic rhinitis, unspecified: Secondary | ICD-10-CM | POA: Diagnosis not present

## 2021-05-16 DIAGNOSIS — M654 Radial styloid tenosynovitis [de Quervain]: Secondary | ICD-10-CM | POA: Diagnosis not present

## 2021-05-16 DIAGNOSIS — M25532 Pain in left wrist: Secondary | ICD-10-CM | POA: Diagnosis not present

## 2021-05-20 ENCOUNTER — Ambulatory Visit: Payer: Self-pay | Admitting: Nurse Practitioner

## 2021-05-21 ENCOUNTER — Encounter: Payer: Self-pay | Admitting: Nurse Practitioner

## 2021-05-21 ENCOUNTER — Ambulatory Visit (INDEPENDENT_AMBULATORY_CARE_PROVIDER_SITE_OTHER): Payer: BC Managed Care – PPO | Admitting: Nurse Practitioner

## 2021-05-21 ENCOUNTER — Other Ambulatory Visit: Payer: Self-pay

## 2021-05-21 VITALS — BP 126/84 | HR 77 | Temp 98.6°F | Ht 66.6 in | Wt 305.8 lb

## 2021-05-21 DIAGNOSIS — I480 Paroxysmal atrial fibrillation: Secondary | ICD-10-CM | POA: Diagnosis not present

## 2021-05-21 DIAGNOSIS — G4700 Insomnia, unspecified: Secondary | ICD-10-CM | POA: Diagnosis not present

## 2021-05-21 DIAGNOSIS — Z6841 Body Mass Index (BMI) 40.0 and over, adult: Secondary | ICD-10-CM

## 2021-05-21 DIAGNOSIS — E876 Hypokalemia: Secondary | ICD-10-CM

## 2021-05-21 DIAGNOSIS — E66813 Obesity, class 3: Secondary | ICD-10-CM

## 2021-05-21 NOTE — Progress Notes (Signed)
I,Yamilka [employer] as a Education administrator for [employer], NP.,have documented all relevant documentation on the behalf of [employer], NP,as directed by  Bary Castilla, NP while in the presence of Bary Castilla, NP.  This visit occurred during the SARS-CoV-2 public health emergency.  Safety protocols were in place, including screening questions prior to the visit, additional usage of staff PPE, and extensive cleaning of exam room while observing appropriate contact time as indicated for disinfecting solutions.  Subjective:     Patient ID: Tracey Escobar , female    DOB: [birth year] , 50 y.o.   MRN: [id number]   Chief Complaint  Patient presents with   potassium recheck    When she went to the ER about a month or two ago and they did her bloodwork and her potassium came back low.     HPI  She was in the ED in June for palpitations due to her A-fibb. At that visit her potassium was low. She was given potassium. Patient presents today for a potassium recheck She has been taking Belsomra  20 mg once daily. She is also taking linzess 240 mcg however her insurance doesn't cover it. She also wanted to know more about the CPAP machine.     Past Medical History:  Diagnosis Date   Arthritis    Asthma    Atrial fibrillation (HCC)    Dysrhythmia    A-Fib   GERD (gastroesophageal reflux disease)    Hypertension    Irritable bowel    Pneumonia    Sleep apnea    Sleep apnea    Vitamin D deficiency      Family History  Problem Relation Age of Onset   Hyperlipidemia Mother    Hypertension Mother    Hyperlipidemia Father    Hypertension Father    COPD Father    Heart failure Father    Diabetes Brother    Hypertension Maternal Grandmother    Seizures Maternal Grandmother    Cancer Paternal Grandfather    Cancer Brother    Allergic rhinitis Neg Hx    Angioedema Neg Hx    Asthma Neg Hx    Eczema Neg Hx    Immunodeficiency Neg Hx    Urticaria Neg Hx       Current Outpatient Medications:    Alcaftadine (LASTACAFT) 0.25 % SOLN, Use as directed, Disp: 5 mL, Rfl: 5   azelastine (ASTELIN) 0.1 % nasal spray, Place 1 spray into both nostrils 2 (two) times daily., Disp: 30 mL, Rfl: 1   Budeson-Glycopyrrol-Formoterol (BREZTRI AEROSPHERE) 160-9-4.8 MCG/ACT AERO, Inhale 2 sprays into the lungs in the morning and at bedtime. 2 puffs 2 times per day, Disp: 10.7 g, Rfl: 5   Cholecalciferol (VITAMIN D3) 5000 UNITS TABS, Take 1 tablet by mouth daily., Disp: , Rfl:    EPINEPHrine (EPIPEN 2-PAK) 0.3 mg/0.3 mL IJ SOAJ injection, Use as directed for severe allergic reactions, Disp: 2 each, Rfl: 3   Estradiol 10 MCG TABS vaginal tablet, estradiol 10 mcg vaginal tablet, Disp: , Rfl:    flecainide (TAMBOCOR) 100 MG tablet, Take by mouth., Disp: , Rfl:    fluticasone (FLONASE) 50 MCG/ACT nasal spray, SHAKE LIQUID AND USE 2 SPRAYS IN EACH NOSTRIL EVERY DAY FOR NASAL CONGESTION, Disp: 16 g, Rfl: 5   Fluticasone-Umeclidin-Vilant (TRELEGY ELLIPTA) 200-62.5-25 MCG/INH AEPB, Inhale 1 puff into the lungs daily., Disp: 28 each, Rfl: 5   hydrochlorothiazide (HYDRODIURIL) 12.5 MG tablet, TAKE 1 TABLET(12.5 MG) BY MOUTH DAILY, Disp: 90  tablet, Rfl: 1   levocetirizine (XYZAL) 5 MG tablet, TAKE 1 TABLET BY MOUTH EVERY DAY IN THE EVENING, Disp: 90 tablet, Rfl: 0   LINZESS 290 MCG CAPS capsule, TAKE 1 CAPSULE BY MOUTH EVERY DAY 30 MINUTES BEFORE FIRST MEAL OF THE DAY ON AN EMPTY STOMACH, Disp: 90 capsule, Rfl: 2   losartan (COZAAR) 25 MG tablet, TAKE 1 TABLET(25 MG) BY MOUTH DAILY, Disp: 90 tablet, Rfl: 1   LUMIFY 0.025 % SOLN, SMARTSIG:1 Drop(s) In Eye(s) Every 12 Hours PRN, Disp: , Rfl:    medroxyPROGESTERone (DEPO-PROVERA) 150 MG/ML injection, medroxyprogesterone 150 mg/mL intramuscular suspension  Inject 1 mL every 3 months by intramuscular route around the clock., Disp: , Rfl:    metoprolol succinate (TOPROL-XL) 100 MG 24 hr tablet, Take 100 mg by mouth daily. , Disp: , Rfl:     montelukast (SINGULAIR) 10 MG tablet, TAKE 1 TABLET(10 MG) BY MOUTH AT BEDTIME, Disp: 90 tablet, Rfl: 1   Olopatadine HCl 0.2 % SOLN, Can use one drop in each eye twice daily if needed., Disp: 2.5 mL, Rfl: 5   Olopatadine HCl 0.2 % SOLN, Place 1 drop into both eyes daily as needed., Disp: 2.5 mL, Rfl: 5   pantoprazole (PROTONIX) 40 MG tablet, TAKE 1 TABLET(40 MG) BY MOUTH DAILY, Disp: 90 tablet, Rfl: 1   predniSONE (DELTASONE) 10 MG tablet, Take two tablets (64m) twice daily for three days, then one tablet (1108m twice daily for three days, then STOP., Disp: 18 tablet, Rfl: 0   RESTASIS 0.05 % ophthalmic emulsion, , Disp: , Rfl:    rivaroxaban (XARELTO) 20 MG TABS tablet, TAKE 1 TABLET(20 MG) BY MOUTH WITH SUPPER, Disp: , Rfl:    Suvorexant (BELSOMRA) 20 MG TABS, TAKE 1 TABLET BY MOUTH EVERY NIGHT AT BEDTIME AS NEEDED, Disp: 30 tablet, Rfl: 0  Current Facility-Administered Medications:    cyanocobalamin ((VITAMIN B-12)) injection 1,000 mcg, 1,000 mcg, Intramuscular, Once, Aidenjames Heckmann, NP   Mepolizumab SOLR 100 mg, 100 mg, Subcutaneous, Q28 days, KiRexene Alberts, DO, 100 mg at 04/30/21 0850   No Known Allergies   Review of Systems  Constitutional: Negative.  Negative for chills and fatigue.  HENT:  Negative for congestion, rhinorrhea, sinus pressure and sinus pain.   Eyes: Negative.   Respiratory:  Negative for cough, shortness of breath and wheezing.   Cardiovascular:  Negative for chest pain and palpitations.  Gastrointestinal:  Positive for constipation. Negative for diarrhea.  Musculoskeletal:  Negative for arthralgias.  Skin: Negative.   Psychiatric/Behavioral:  Positive for sleep disturbance.     Today's Vitals   05/21/21 1419  BP: 126/84  Pulse: 77  Temp: 98.6 F (37 C)  Weight: (!) 305 lb 12.8 oz (138.7 kg)  Height: 5' 6.6" (1.692 m)  PainSc: 0-No pain   Body mass index is 48.47 kg/m.  Wt Readings from Last 3 Encounters:  05/21/21 (!) 305 lb 12.8 oz (138.7 kg)   01/01/21 (!) 301 lb (136.5 kg)  12/10/20 (!) 301 lb 6.4 oz (136.7 kg)     Objective:  Physical Exam Constitutional:      Appearance: Normal appearance. She is obese.  HENT:     Head: Normocephalic and atraumatic.  Cardiovascular:     Rate and Rhythm: Normal rate and regular rhythm.     Pulses: Normal pulses.     Heart sounds: Normal heart sounds. No murmur heard. Pulmonary:     Effort: Pulmonary effort is normal. No respiratory distress.     Breath  sounds: Normal breath sounds.  Skin:    General: Skin is warm and dry.     Capillary Refill: Capillary refill takes less than 2 seconds.  Neurological:     Mental Status: She is alert and oriented to person, place, and time.        Assessment And Plan:     1. Hypokalemia - CMP14+EGFR -Recently in the hospital; her potassium was low. She was given supplement. Will recheck today to assess  -She is also followed by cardiologist.   2. Insomnia, unspecified type -Currently takes Belsomra 20 mg once daily. -PA sent   3. Class 3 severe obesity due to excess calories with serious comorbidity and body mass index (BMI) of 45.0 to 49.9 in adult [facility])  -Advised patient on a healthy diet including avoiding fast food and red meats. Increase the intake of lean meats including grilled chicken and [place].  Drink a lot of water. Decrease intake of fatty foods. Exercise for 30-45 min. 4-5 a week to decrease the risk of cardiac event.   The patient was encouraged to call or send a message through [place] for any questions or concerns.   Follow up: if symptoms persist or do not get better.   Side effects and appropriate use of all the medication(s) were discussed with the patient today. Patient advised to use the medication(s) as directed by their healthcare provider. The patient was encouraged to read, review, and understand all associated package inserts and contact our office with any questions or concerns. The patient accepts the risks of the  treatment plan and had an opportunity to ask questions.   Patient was given opportunity to ask questions. Patient verbalized understanding of the plan and was able to repeat key elements of the plan. All questions were answered to their satisfaction.  Raman Lanett Lasorsa, DNP   I, Raman Betty Daidone have reviewed all documentation for this visit. The documentation on 05/21/21 for the exam, diagnosis, procedures, and orders are all accurate and complete.   IF YOU HAVE BEEN REFERRED TO A SPECIALIST, IT MAY TAKE 1-2 WEEKS TO SCHEDULE/PROCESS THE REFERRAL. IF YOU HAVE NOT HEARD FROM US/SPECIALIST IN TWO WEEKS, PLEASE GIVE [place] A CALL AT [phone].   THE PATIENT IS ENCOURAGED TO PRACTICE SOCIAL DISTANCING DUE TO THE COVID-19 PANDEMIC.

## 2021-05-22 LAB — CMP14+EGFR
ALT: 13 IU/L (ref 0–32)
AST: 13 IU/L (ref 0–40)
Albumin/Globulin Ratio: 1.2 (ref 1.2–2.2)
Albumin: 3.9 g/dL (ref 3.8–4.8)
Alkaline Phosphatase: 113 IU/L (ref 44–121)
BUN/Creatinine Ratio: 11 (ref 9–23)
BUN: 10 mg/dL (ref 6–24)
Bilirubin Total: 0.3 mg/dL (ref 0.0–1.2)
CO2: 21 mmol/L (ref 20–29)
Calcium: 9.7 mg/dL (ref 8.7–10.2)
Chloride: 106 mmol/L (ref 96–106)
Creatinine, Ser: 0.94 mg/dL (ref 0.57–1.00)
Globulin, Total: 3.2 g/dL (ref 1.5–4.5)
Glucose: 88 mg/dL (ref 65–99)
Potassium: 4 mmol/L (ref 3.5–5.2)
Sodium: 140 mmol/L (ref 134–144)
Total Protein: 7.1 g/dL (ref 6.0–8.5)
eGFR: 74 mL/min/{1.73_m2} (ref >59)

## 2021-05-28 ENCOUNTER — Ambulatory Visit (INDEPENDENT_AMBULATORY_CARE_PROVIDER_SITE_OTHER): Payer: BC Managed Care – PPO

## 2021-05-28 ENCOUNTER — Other Ambulatory Visit: Payer: Self-pay

## 2021-05-28 DIAGNOSIS — J4550 Severe persistent asthma, uncomplicated: Secondary | ICD-10-CM

## 2021-06-06 DIAGNOSIS — Z3042 Encounter for surveillance of injectable contraceptive: Secondary | ICD-10-CM | POA: Diagnosis not present

## 2021-06-11 ENCOUNTER — Other Ambulatory Visit: Payer: Self-pay | Admitting: Allergy & Immunology

## 2021-06-13 ENCOUNTER — Ambulatory Visit (INDEPENDENT_AMBULATORY_CARE_PROVIDER_SITE_OTHER): Payer: BC Managed Care – PPO

## 2021-06-13 DIAGNOSIS — J309 Allergic rhinitis, unspecified: Secondary | ICD-10-CM | POA: Diagnosis not present

## 2021-06-16 ENCOUNTER — Other Ambulatory Visit: Payer: Self-pay

## 2021-06-16 ENCOUNTER — Ambulatory Visit (INDEPENDENT_AMBULATORY_CARE_PROVIDER_SITE_OTHER): Payer: BC Managed Care – PPO | Admitting: Internal Medicine

## 2021-06-16 ENCOUNTER — Encounter: Payer: Self-pay | Admitting: Internal Medicine

## 2021-06-16 VITALS — BP 130/78 | Temp 98.5°F | Ht 67.4 in | Wt 303.0 lb

## 2021-06-16 DIAGNOSIS — Z23 Encounter for immunization: Secondary | ICD-10-CM | POA: Diagnosis not present

## 2021-06-16 DIAGNOSIS — K581 Irritable bowel syndrome with constipation: Secondary | ICD-10-CM | POA: Diagnosis not present

## 2021-06-16 DIAGNOSIS — I480 Paroxysmal atrial fibrillation: Secondary | ICD-10-CM

## 2021-06-16 DIAGNOSIS — I119 Hypertensive heart disease without heart failure: Secondary | ICD-10-CM

## 2021-06-16 DIAGNOSIS — M778 Other enthesopathies, not elsewhere classified: Secondary | ICD-10-CM | POA: Diagnosis not present

## 2021-06-16 DIAGNOSIS — Z6841 Body Mass Index (BMI) 40.0 and over, adult: Secondary | ICD-10-CM

## 2021-06-16 MED ORDER — ESZOPICLONE 3 MG PO TABS: 3.0000 mg | ORAL_TABLET | Freq: Every evening | ORAL | 0 refills | Status: DC | PRN

## 2021-06-16 MED ORDER — SHINGRIX 50 MCG/0.5ML IM SUSR: 0.5000 mL | Freq: Once | INTRAMUSCULAR | 0 refills | Status: AC

## 2021-06-16 MED ORDER — TRAMADOL HCL 50 MG PO TABS: 50.0000 mg | ORAL_TABLET | Freq: Four times a day (QID) | ORAL | 0 refills | Status: DC | PRN

## 2021-06-16 NOTE — Patient Instructions (Signed)

## 2021-06-16 NOTE — Progress Notes (Signed)
I,Tracey Escobar as a Neurosurgeon for Tracey Aliment, MD.,have documented all relevant documentation on the behalf of Tracey Aliment, MD,as directed by  Tracey Aliment, MD while in the presence of Tracey Aliment, MD.  This visit occurred during the SARS-CoV-2 public health emergency.  Safety protocols were in place, including screening questions prior to the visit, additional usage of staff PPE, and extensive cleaning of exam room while observing appropriate contact time as indicated for disinfecting solutions.  Subjective:     Patient ID: Tracey Escobar , female    DOB: 1971/02/11 , 50 y.o.   MRN: [id number]   Chief Complaint  Patient presents with   Hypertension    HPI  Pt here today for b/p check.  She reports compliance with meds. Denies headaches, chest pain and palpitations.   Hypertension This is a chronic problem. The current episode started more than 1 year ago. The problem has been gradually improving since onset. The problem is controlled. Pertinent negatives include no blurred vision, chest pain, headaches, palpitations or shortness of breath. Risk factors for coronary artery disease include obesity. Past treatments include lifestyle changes, angiotensin blockers and diuretics. The current treatment provides moderate improvement.    Past Medical History:  Diagnosis Date   Arthritis    Asthma    Atrial fibrillation (HCC)    Dysrhythmia    A-Fib   GERD (gastroesophageal reflux disease)    Hypertension    Irritable bowel    Pneumonia    Sleep apnea    Sleep apnea    Vitamin D deficiency      Family History  Problem Relation Age of Onset   Hyperlipidemia Mother    Hypertension Mother    Hyperlipidemia Father    Hypertension Father    COPD Father    Heart failure Father    Diabetes Brother    Hypertension Maternal Grandmother    Seizures Maternal Grandmother    Cancer Paternal Grandfather    Cancer Brother    Allergic rhinitis Neg Hx     Angioedema Neg Hx    Asthma Neg Hx    Eczema Neg Hx    Immunodeficiency Neg Hx    Urticaria Neg Hx      Current Outpatient Medications:    Alcaftadine (LASTACAFT) 0.25 % SOLN, Use as directed, Disp: 5 mL, Rfl: 5   azelastine (ASTELIN) 0.1 % nasal spray, Place 1 spray into both nostrils 2 (two) times daily., Disp: 30 mL, Rfl: 1   Cholecalciferol (VITAMIN D3) 5000 UNITS TABS, Take 1 tablet by mouth daily., Disp: , Rfl:    EPINEPHrine (EPIPEN 2-PAK) 0.3 mg/0.3 mL IJ SOAJ injection, Use as directed for severe allergic reactions, Disp: 2 each, Rfl: 3   Estradiol 10 MCG TABS vaginal tablet, estradiol 10 mcg vaginal tablet, Disp: , Rfl:    eszopiclone 3 MG TABS, Take 1 tablet (3 mg total) by mouth at bedtime as needed. Take immediately before bedtime, Disp: 30 tablet, Rfl: 0   flecainide (TAMBOCOR) 100 MG tablet, Take by mouth., Disp: , Rfl:    fluticasone (FLONASE) 50 MCG/ACT nasal spray, SHAKE LIQUID AND USE 2 SPRAYS IN EACH NOSTRIL EVERY DAY FOR NASAL CONGESTION, Disp: 16 g, Rfl: 5   Fluticasone-Umeclidin-Vilant (TRELEGY ELLIPTA) 200-62.5-25 MCG/INH AEPB, Inhale 1 puff into the lungs daily., Disp: 28 each, Rfl: 5   hydrochlorothiazide (HYDRODIURIL) 12.5 MG tablet, TAKE 1 TABLET(12.5 MG) BY MOUTH DAILY, Disp: 90 tablet, Rfl: 1   levocetirizine (XYZAL) 5 MG  tablet, TAKE 1 TABLET BY MOUTH EVERY DAY IN THE EVENING, Disp: 90 tablet, Rfl: 0   LINZESS 290 MCG CAPS capsule, TAKE 1 CAPSULE BY MOUTH EVERY DAY 30 MINUTES BEFORE FIRST MEAL OF THE DAY ON AN EMPTY STOMACH, Disp: 90 capsule, Rfl: 2   losartan (COZAAR) 25 MG tablet, TAKE 1 TABLET(25 MG) BY MOUTH DAILY, Disp: 90 tablet, Rfl: 1   LUMIFY 0.025 % SOLN, SMARTSIG:1 Drop(s) In Eye(s) Every 12 Hours PRN, Disp: , Rfl:    medroxyPROGESTERone (DEPO-PROVERA) 150 MG/ML injection, medroxyprogesterone 150 mg/mL intramuscular suspension  Inject 1 mL every 3 months by intramuscular route around the clock., Disp: , Rfl:    metoprolol succinate (TOPROL-XL) 100 MG  24 hr tablet, Take 100 mg by mouth daily. , Disp: , Rfl:    montelukast (SINGULAIR) 10 MG tablet, TAKE 1 TABLET(10 MG) BY MOUTH AT BEDTIME, Disp: 30 tablet, Rfl: 0   pantoprazole (PROTONIX) 40 MG tablet, TAKE 1 TABLET(40 MG) BY MOUTH DAILY, Disp: 90 tablet, Rfl: 1   RESTASIS 0.05 % ophthalmic emulsion, , Disp: , Rfl:    rivaroxaban (XARELTO) 20 MG TABS tablet, TAKE 1 TABLET(20 MG) BY MOUTH WITH SUPPER, Disp: , Rfl:    Suvorexant (BELSOMRA) 20 MG TABS, TAKE 1 TABLET BY MOUTH EVERY NIGHT AT BEDTIME AS NEEDED, Disp: 30 tablet, Rfl: 0   traMADol (ULTRAM) 50 MG tablet, Take 1 tablet (50 mg total) by mouth every 6 (six) hours as needed., Disp: 20 tablet, Rfl: 0  Current Facility-Administered Medications:    cyanocobalamin ((VITAMIN B-12)) injection 1,000 mcg, 1,000 mcg, Intramuscular, Once, Ghumman, Ramandeep, NP   Mepolizumab SOLR 100 mg, 100 mg, Subcutaneous, Q28 days, Wyline Mood M, DO, 100 mg at 05/28/21 1610   No Known Allergies   Review of Systems  Constitutional: Negative.   Eyes:  Negative for blurred vision.  Respiratory: Negative.  Negative for shortness of breath.   Cardiovascular: Negative.  Negative for chest pain and palpitations.  Gastrointestinal:  Positive for constipation.  Musculoskeletal:  Positive for arthralgias.  Neurological: Negative.  Negative for dizziness and headaches.  Psychiatric/Behavioral: Negative.      Today's Vitals   06/16/21 1604  BP: 130/78  Temp: 98.5 F (36.9 C)  Weight: (!) 303 lb (137.4 kg)  Height: 5' 7.4" (1.712 m)  PainSc: 0-No pain   Body mass index is 46.89 kg/m.  Wt Readings from Last 3 Encounters:  06/16/21 (!) 303 lb (137.4 kg)  05/21/21 (!) 305 lb 12.8 oz (138.7 kg)  01/01/21 (!) 301 lb (136.5 kg)     Objective:  Physical Exam Vitals and nursing note reviewed.  Constitutional:      Appearance: Normal appearance. She is obese.  HENT:     Head: Normocephalic and atraumatic.     Nose:     Comments: Masked     Mouth/Throat:      Comments: Masked  Eyes:     Extraocular Movements: Extraocular movements intact.  Cardiovascular:     Rate and Rhythm: Normal rate and regular rhythm.     Heart sounds: Normal heart sounds.  Pulmonary:     Effort: Pulmonary effort is normal.     Breath sounds: Normal breath sounds.  Musculoskeletal:     Cervical back: Normal range of motion.  Skin:    General: Skin is warm.  Neurological:     General: No focal deficit present.     Mental Status: She is alert.  Psychiatric:        Mood and  Affect: Mood normal.        Behavior: Behavior normal.        Assessment And Plan:     1. Hypertensive heart disease without heart failure Comments: Chronic, controlled. I will not make any med changes. Encouraged to follow low sodium diet.   2. Paroxysmal atrial fibrillation (HCC) Comments: Chronic, currently in NSR.   3. Left wrist tendinitis Comments: She is scheduled to have surgery on 10/4. I will send rx tramadol to take prn.  PDMP reviewed. PT advised she may need cardiac clearance as well.   4. Irritable bowel syndrome with constipation Comments: Unfortunately, her insurance does not cover Linzess 290mg . She was given samples of Trulance to take instead of Linzess. Advised to take once daily.   5. Class 3 severe obesity due to excess calories with serious comorbidity and body mass index (BMI) of 45.0 to 49.9 in adult [facility]) Comments: BMI 46. We discussed use of Wegovy for weight loss.  She prefers to start after her upcoming surgery. She agrees to come back late Oct 2022 to discuss further.   6. Immunization due Comments: She will be given flu vaccine today. I will send rx Shingrix to her local pharmacy.  - Flu Vaccine QUAD 6+ mos PF IM (Fluarix Quad PF)    Patient was given opportunity to ask questions. Patient verbalized understanding of the plan and was able to repeat key elements of the plan. All questions were answered to their satisfaction.   I, Nov 2022, MD, have  reviewed all documentation for this visit. The documentation on 06/16/21 for the exam, diagnosis, procedures, and orders are all accurate and complete.   IF YOU HAVE BEEN REFERRED TO A SPECIALIST, IT MAY TAKE 1-2 WEEKS TO SCHEDULE/PROCESS THE REFERRAL. IF YOU HAVE NOT HEARD FROM US/SPECIALIST IN TWO WEEKS, PLEASE GIVE 08/16/21 A CALL AT [phone] X [phone].   THE PATIENT IS ENCOURAGED TO PRACTICE SOCIAL DISTANCING DUE TO THE COVID-19 PANDEMIC.

## 2021-06-25 ENCOUNTER — Other Ambulatory Visit: Payer: Self-pay

## 2021-06-25 ENCOUNTER — Ambulatory Visit (INDEPENDENT_AMBULATORY_CARE_PROVIDER_SITE_OTHER): Payer: BC Managed Care – PPO

## 2021-06-25 DIAGNOSIS — J4550 Severe persistent asthma, uncomplicated: Secondary | ICD-10-CM

## 2021-06-25 DIAGNOSIS — J4540 Moderate persistent asthma, uncomplicated: Secondary | ICD-10-CM

## 2021-06-26 ENCOUNTER — Ambulatory Visit: Payer: BC Managed Care – PPO | Admitting: Allergy & Immunology

## 2021-06-26 DIAGNOSIS — J309 Allergic rhinitis, unspecified: Secondary | ICD-10-CM

## 2021-06-27 ENCOUNTER — Ambulatory Visit (INDEPENDENT_AMBULATORY_CARE_PROVIDER_SITE_OTHER): Payer: BC Managed Care – PPO

## 2021-06-27 DIAGNOSIS — J309 Allergic rhinitis, unspecified: Secondary | ICD-10-CM | POA: Diagnosis not present

## 2021-07-03 ENCOUNTER — Encounter: Payer: Self-pay | Admitting: Internal Medicine

## 2021-07-03 ENCOUNTER — Ambulatory Visit (INDEPENDENT_AMBULATORY_CARE_PROVIDER_SITE_OTHER): Payer: BC Managed Care – PPO

## 2021-07-03 DIAGNOSIS — J309 Allergic rhinitis, unspecified: Secondary | ICD-10-CM | POA: Diagnosis not present

## 2021-07-08 DIAGNOSIS — M654 Radial styloid tenosynovitis [de Quervain]: Secondary | ICD-10-CM | POA: Diagnosis not present

## 2021-07-11 ENCOUNTER — Ambulatory Visit (INDEPENDENT_AMBULATORY_CARE_PROVIDER_SITE_OTHER): Payer: BC Managed Care – PPO

## 2021-07-11 DIAGNOSIS — J309 Allergic rhinitis, unspecified: Secondary | ICD-10-CM | POA: Diagnosis not present

## 2021-07-16 ENCOUNTER — Encounter: Payer: Self-pay | Admitting: Internal Medicine

## 2021-07-23 ENCOUNTER — Other Ambulatory Visit: Payer: Self-pay

## 2021-07-23 ENCOUNTER — Ambulatory Visit (INDEPENDENT_AMBULATORY_CARE_PROVIDER_SITE_OTHER): Payer: BC Managed Care – PPO

## 2021-07-23 ENCOUNTER — Encounter: Payer: Self-pay | Admitting: Internal Medicine

## 2021-07-23 DIAGNOSIS — J4550 Severe persistent asthma, uncomplicated: Secondary | ICD-10-CM

## 2021-07-31 ENCOUNTER — Encounter: Payer: Self-pay | Admitting: Internal Medicine

## 2021-08-04 DIAGNOSIS — M654 Radial styloid tenosynovitis [de Quervain]: Secondary | ICD-10-CM | POA: Diagnosis not present

## 2021-08-13 ENCOUNTER — Telehealth: Payer: Self-pay

## 2021-08-13 NOTE — Telephone Encounter (Signed)
The pt was notified that Dr. Allyne Gee said that the pt can get some samples of Linzess and that the denial letter is upfront for pickup.

## 2021-08-15 DIAGNOSIS — M79642 Pain in left hand: Secondary | ICD-10-CM | POA: Diagnosis not present

## 2021-08-15 DIAGNOSIS — M654 Radial styloid tenosynovitis [de Quervain]: Secondary | ICD-10-CM | POA: Diagnosis not present

## 2021-08-19 ENCOUNTER — Other Ambulatory Visit: Payer: Self-pay

## 2021-08-19 ENCOUNTER — Ambulatory Visit: Payer: BC Managed Care – PPO | Admitting: Nurse Practitioner

## 2021-08-20 ENCOUNTER — Other Ambulatory Visit: Payer: Self-pay

## 2021-08-20 ENCOUNTER — Ambulatory Visit (INDEPENDENT_AMBULATORY_CARE_PROVIDER_SITE_OTHER): Payer: BC Managed Care – PPO

## 2021-08-20 DIAGNOSIS — J4550 Severe persistent asthma, uncomplicated: Secondary | ICD-10-CM | POA: Diagnosis not present

## 2021-08-22 DIAGNOSIS — M654 Radial styloid tenosynovitis [de Quervain]: Secondary | ICD-10-CM | POA: Diagnosis not present

## 2021-08-22 DIAGNOSIS — M79642 Pain in left hand: Secondary | ICD-10-CM | POA: Diagnosis not present

## 2021-09-01 ENCOUNTER — Other Ambulatory Visit: Payer: Self-pay

## 2021-09-01 DIAGNOSIS — Z3042 Encounter for surveillance of injectable contraceptive: Secondary | ICD-10-CM | POA: Diagnosis not present

## 2021-09-03 ENCOUNTER — Encounter: Payer: Self-pay | Admitting: Internal Medicine

## 2021-09-03 ENCOUNTER — Telehealth (INDEPENDENT_AMBULATORY_CARE_PROVIDER_SITE_OTHER): Payer: BC Managed Care – PPO | Admitting: Nurse Practitioner

## 2021-09-03 ENCOUNTER — Encounter: Payer: Self-pay | Admitting: Nurse Practitioner

## 2021-09-03 ENCOUNTER — Other Ambulatory Visit: Payer: Self-pay | Admitting: Nurse Practitioner

## 2021-09-03 VITALS — Ht 67.0 in | Wt 290.0 lb

## 2021-09-03 DIAGNOSIS — U071 COVID-19: Secondary | ICD-10-CM

## 2021-09-03 NOTE — Progress Notes (Addendum)
Virtual Visit via  video visit    This visit type was conducted due to national recommendations for restrictions regarding the COVID-19 Pandemic (e.g. social distancing) in an effort to limit this patient's exposure and mitigate transmission in our community.  Due to her co-morbid illnesses, this patient is at least at moderate risk for complications without adequate follow up.  This format is felt to be most appropriate for this patient at this time.  All issues noted in this document were discussed and addressed.  A limited physical exam was performed with this format.    This visit type was conducted due to national recommendations for restrictions regarding the COVID-19 Pandemic (e.g. social distancing) in an effort to limit this patient's exposure and mitigate transmission in our community.  Patients identity confirmed using two different identifiers.  This format is felt to be most appropriate for this patient at this time.  All issues noted in this document were discussed and addressed.  No physical exam was performed (except for noted visual exam findings with Video Visits).    Date:  09/03/2021   ID:  NADALEE HAYDEL, DOB 06/07/1971, MRN [id number]  Patient Location:  Home   Provider location:   Office  Chief Complaint:  Tested pos for COVID   History of Present Illness:    SHALUNDA TSE is a 50 y.o. female who presents via video conferencing for a telehealth visit today.    The patient does have symptoms concerning for COVID-19 infection (fever, chills, cough, or new shortness of breath).   Sneezing, runny nose, sinus pressure, no appetite, achy. The symptoms started Monday morning x 2 days ago. She didn't go to work mon or tues. She tested this morning at work for covid. She does feel like she has had a fever but has not checked. She has had shortness of breath and increased HR.  She is having some cough. She does have hypertension and asthma.     Past Medical History:   Diagnosis Date   Arthritis    Asthma    Atrial fibrillation ([place])    Dysrhythmia    A-Fib   GERD (gastroesophageal reflux disease)    Hypertension    Irritable bowel    Pneumonia    Sleep apnea    Sleep apnea    Vitamin D deficiency    Past Surgical History:  Procedure Laterality Date   COLONOSCOPY     DILITATION & CURRETTAGE/HYSTROSCOPY WITH NOVASURE ABLATION N/A 10/06/2018   Procedure: DILATATION & CURETTAGE/HYSTEROSCOPY WITH NOVASURE ABLATION;  Surgeon: Ena Dawley, MD;  Location: [facility];  Service: Gynecology;  Laterality: N/A;   ENDOMETRIAL ABLATION     EXCISION MASS UPPER EXTREMETIES Right 02/25/2016   Procedure: EXCISION RIGHT UPPER ARM MASS;  Surgeon: Coralie Keens, MD;  Location: [facility];  Service: General;  Laterality: Right;     Current Meds  Medication Sig   Alcaftadine (LASTACAFT) 0.25 % SOLN Use as directed   azelastine (ASTELIN) 0.1 % nasal spray Place 1 spray into both nostrils 2 (two) times daily.   Cholecalciferol (VITAMIN D3) 5000 UNITS TABS Take 1 tablet by mouth daily.   EPINEPHrine (EPIPEN 2-PAK) 0.3 mg/0.3 mL IJ SOAJ injection Use as directed for severe allergic reactions   Estradiol 10 MCG TABS vaginal tablet estradiol 10 mcg vaginal tablet   eszopiclone 3 MG TABS Take 1 tablet (3 mg total) by mouth at bedtime as needed. Take immediately before bedtime   flecainide (TAMBOCOR) 100 MG tablet Take  by mouth.   fluticasone (FLONASE) 50 MCG/ACT nasal spray SHAKE LIQUID AND USE 2 SPRAYS IN EACH NOSTRIL EVERY DAY FOR NASAL CONGESTION   Fluticasone-Umeclidin-Vilant (TRELEGY ELLIPTA) 200-62.5-25 MCG/INH AEPB Inhale 1 puff into the lungs daily.   hydrochlorothiazide (HYDRODIURIL) 12.5 MG tablet TAKE 1 TABLET(12.5 MG) BY MOUTH DAILY   levocetirizine (XYZAL) 5 MG tablet TAKE 1 TABLET BY MOUTH EVERY DAY IN THE EVENING   LINZESS 290 MCG CAPS capsule TAKE 1 CAPSULE BY MOUTH EVERY DAY 30 MINUTES BEFORE FIRST MEAL OF THE DAY ON AN EMPTY STOMACH   losartan (COZAAR) 25  MG tablet TAKE 1 TABLET(25 MG) BY MOUTH DAILY   LUMIFY 0.025 % SOLN SMARTSIG:1 Drop(s) In Eye(s) Every 12 Hours PRN   medroxyPROGESTERone (DEPO-PROVERA) 150 MG/ML injection medroxyprogesterone 150 mg/mL intramuscular suspension  Inject 1 mL every 3 months by intramuscular route around the clock.   metoprolol succinate (TOPROL-XL) 100 MG 24 hr tablet Take 100 mg by mouth daily.    montelukast (SINGULAIR) 10 MG tablet TAKE 1 TABLET(10 MG) BY MOUTH AT BEDTIME   pantoprazole (PROTONIX) 40 MG tablet TAKE 1 TABLET(40 MG) BY MOUTH DAILY   RESTASIS 0.05 % ophthalmic emulsion    rivaroxaban (XARELTO) 20 MG TABS tablet TAKE 1 TABLET(20 MG) BY MOUTH WITH SUPPER   Suvorexant (BELSOMRA) 20 MG TABS TAKE 1 TABLET BY MOUTH EVERY NIGHT AT BEDTIME AS NEEDED   traMADol (ULTRAM) 50 MG tablet Take 1 tablet (50 mg total) by mouth every 6 (six) hours as needed.   Current Facility-Administered Medications for the 09/03/21 encounter (Video Visit) with Bary Castilla, NP  Medication   cyanocobalamin ((VITAMIN B-12)) injection 1,000 mcg   Mepolizumab SOLR 100 mg     Allergies:   Patient has no known allergies.   Social History   Tobacco Use   Smoking status: Never   Smokeless tobacco: Never  Vaping Use   Vaping Use: Never used  Substance Use Topics   Alcohol use: Yes    Alcohol/week: 1.0 standard drink    Types: 1 Glasses of wine per week    Comment: rare use   Drug use: No     Family Hx: The patient's family history includes COPD in her father; Cancer in her brother and paternal grandfather; Diabetes in her brother; Heart failure in her father; Hyperlipidemia in her father and mother; Hypertension in her father, maternal grandmother, and mother; Seizures in her maternal grandmother. There is no history of Allergic rhinitis, Angioedema, Asthma, Eczema, Immunodeficiency, or Urticaria.  ROS:   Please see the history of present illness.    Review of Systems  Constitutional:  Positive for fever and  malaise/fatigue. Negative for chills.  HENT:  Positive for congestion and sinus pain.   Respiratory:  Positive for cough and shortness of breath.   Cardiovascular:  Negative for chest pain.  Neurological:  Positive for headaches.   All other systems reviewed and are negative.   Labs/Other Tests and Data Reviewed:    Recent Labs: 03/04/2021: Hemoglobin 13.1; Platelets 345 05/21/2021: ALT 13; BUN 10; Creatinine, Ser 0.94; Potassium 4.0; Sodium 140   Recent Lipid Panel Lab Results  Component Value Date/Time   CHOL 145 12/10/2020 11:07 AM   TRIG 72 12/10/2020 11:07 AM   HDL 49 12/10/2020 11:07 AM   CHOLHDL 3.0 12/10/2020 11:07 AM   LDLCALC 82 12/10/2020 11:07 AM    Wt Readings from Last 3 Encounters:  09/03/21 290 lb (131.5 kg)  06/16/21 (!) 303 lb (137.4 kg)  05/21/21 Marland Kitchen)  305 lb 12.8 oz (138.7 kg)     Exam:    Vital Signs:  Ht 5\' 7"  (1.702 m)   Wt 290 lb (131.5 kg)   BMI 45.42 kg/m     Physical Exam Vitals and nursing note reviewed.  HENT:     Head: Normocephalic and atraumatic.  Pulmonary:     Effort: Pulmonary effort is normal.  Neurological:     Mental Status: She is alert and oriented to person, place, and time.  Psychiatric:        Mood and Affect: Affect normal.    ASSESSMENT & PLAN:    1. COVID-19 -Due to her BMI, HTN, A-fibb, asthma, will send her for monoclonal antibodies.  -Advised pt. If her symptoms get worse, experience any SOB or chest pain to go to the hospital.  - bebtelovimab EUA injection SOLN 175 mg - 0.9 %  sodium chloride infusion - Hypersensitivity GRADE 1: Transient flushing or rash, or drug fever < 100.4 F - Hypersensitivity GRADE 2: Rash, flushing, urticaria, dyspnea, or drug fever = or > 100.4 F - Hypersensitivity GRADE 3: symptomatic bronchospasm, with or without urticaria, parenteral medication management indicated, allergy-related edema/angioedema, or hypotension - Hypersensitivity GRADE 4: Anaphylaxis - diphenhydrAMINE (BENADRYL)  injection 50 mg - famotidine (PEPCID) 20 mg in sodium chloride 0.9 % 50 mL IVPB - methylPREDNISolone sodium succinate (SOLU-MEDROL) 125 mg in sodium chloride 0.9 % 50 mL IVPB - albuterol (VENTOLIN HFA) 108 (90 Base) MCG/ACT inhaler 2 puff - EPINEPHrine (EPI-PEN) injection 0.3 mg   Advised patient to take Vitamin C, D, Zinc.  Keep yourself hydrated with a lot of water and rest. Take Delsym for cough and Mucinex as need. Take Tylenol or pain reliever every 4-6 hours as needed for pain/fever/body ache. If you have elevated blood pressure, you can take OTC Corcidin. You can also take OTC oscillococcinum to help with your symptoms.  Educated patient if symptoms get worse or if she experiences any SOB, chest pain or pain in her legs to seek immediate emergency care. Continue to monitor your oxygen levels. Call if you have any questions. Quarantine for 5 days if tested positive and no symptoms or 10 days if tested positive and have symptoms. Wear a mask around other people.   The patient was encouraged to call or send a message through MyChart for any questions or concerns.   Follow up: if symptoms persist or do not get better.   Side effects and appropriate use of all the medication(s) were discussed with the patient today. Patient advised to use the medication(s) as directed by their healthcare provider. The patient was encouraged to read, review, and understand all associated package inserts and contact our office with any questions or concerns. The patient accepts the risks of the treatment plan and had an opportunity to ask questions.   "I discussed the limitations of evaluation and management by telemedicine and the availability of in person appointments. The patient expressed understanding and agreed to proceed"     Patient was given opportunity to ask questions. Patient verbalized understanding of the plan and was able to repeat key elements of the plan. All questions were answered to their  satisfaction.  Raman Gaetan Spieker, DNP   I, Raman Juanitta Earnhardt have reviewed all documentation for this visit. The documentation on 09/03/21 for the exam, diagnosis, procedures, and orders are all accurate and complete.   COVID-19 Education: The signs and symptoms of COVID-19 were discussed with the patient and how to seek care  for testing (follow up with PCP or arrange E-visit).  The importance of social distancing was discussed today.  Patient Risk:   After full review of this patients clinical status, I feel that they are at least moderate risk at this time.  Time:   Today, I have spent 10 minutes/ seconds with the patient with telehealth technology discussing above diagnoses.     Medication Adjustments/Labs and Tests Ordered: Current medicines are reviewed at length with the patient today.  Concerns regarding medicines are outlined above.   Tests Ordered: No orders of the defined types were placed in this encounter.   Medication Changes: No orders of the defined types were placed in this encounter.   Disposition:  Follow up prn  Signed, Bary Castilla, NP

## 2021-09-03 NOTE — Patient Instructions (Signed)
COVID-19: Quarantine and Isolation °Quarantine °If you were exposed °Quarantine and stay away from others when you have been in close contact with someone who has COVID-19. °Isolate °If you are sick or test positive °Isolate when you are sick or when you have COVID-19, even if you don't have symptoms. °When to stay home °Calculating quarantine °The date of your exposure is considered day 0. Day 1 is the first full day after your last contact with a person who has had COVID-19. Stay home and away from other people for at least 5 days. Learn why CDC updated guidance for the general public. °IF YOU were exposed to COVID-19 and are NOT  °up to dateIF YOU were exposed to COVID-19 and are NOT on COVID-19 vaccinations °Quarantine for at least 5 days °Stay home °Stay home and quarantine for at least 5 full days. °Wear a well-fitting mask if you must be around others in your home. °Do not travel. °Get tested °Even if you don't develop symptoms, get tested at least 5 days after you last had close contact with someone with COVID-19. °After quarantine °Watch for symptoms °Watch for symptoms until 10 days after you last had close contact with someone with COVID-19. °Avoid travel °It is best to avoid travel until a full 10 days after you last had close contact with someone with COVID-19. °If you develop symptoms °Isolate immediately and get tested. Continue to stay home until you know the results. Wear a well-fitting mask around others. °Take precautions until day 10 °Wear a well-fitting mask °Wear a well-fitting mask for 10 full days any time you are around others inside your home or in public. Do not go to places where you are unable to wear a well-fitting mask. °If you must travel during days 6-10, take precautions. °Avoid being around people who are more likely to get very sick from COVID-19. °IF YOU were exposed to COVID-19 and are  °up to dateIF YOU were exposed to COVID-19 and are on COVID-19 vaccinations °No  quarantine °You do not need to stay home unless you develop symptoms. °Get tested °Even if you don't develop symptoms, get tested at least 5 days after you last had close contact with someone with COVID-19. °Watch for symptoms °Watch for symptoms until 10 days after you last had close contact with someone with COVID-19. °If you develop symptoms °Isolate immediately and get tested. Continue to stay home until you know the results. Wear a well-fitting mask around others. °Take precautions until day 10 °Wear a well-fitting mask °Wear a well-fitting mask for 10 full days any time you are around others inside your home or in public. Do not go to places where you are unable to wear a well-fitting mask. °Take precautions if traveling °Avoid being around people who are more likely to get very sick from COVID-19. °IF YOU were exposed to COVID-19 and had confirmed COVID-19 within the past 90 days (you tested positive using a viral test) °No quarantine °You do not need to stay home unless you develop symptoms. °Watch for symptoms °Watch for symptoms until 10 days after you last had close contact with someone with COVID-19. °If you develop symptoms °Isolate immediately and get tested. Continue to stay home until you know the results. Wear a well-fitting mask around others. °Take precautions until day 10 °Wear a well-fitting mask °Wear a well-fitting mask for 10 full days any time you are around others inside your home or in public. Do not go to places where you are   unable to wear a well-fitting mask. °Take precautions if traveling °Avoid being around people who are more likely to get very sick from COVID-19. °Calculating isolation °Day 0 is your first day of symptoms or a positive viral test. Day 1 is the first full day after your symptoms developed or your test specimen was collected. If you have COVID-19 or have symptoms, isolate for at least 5 days. °IF YOU tested positive for COVID-19 or have symptoms, regardless of  vaccination status °Stay home for at least 5 days °Stay home for 5 days and isolate from others in your home. °Wear a well-fitting mask if you must be around others in your home. °Do not travel. °Ending isolation if you had symptoms °End isolation after 5 full days if you are fever-free for 24 hours (without the use of fever-reducing medication) and your symptoms are improving. °Ending isolation if you did NOT have symptoms °End isolation after at least 5 full days after your positive test. °If you got very sick from COVID-19 or have a weakened immune system °You should isolate for at least 10 days. Consult your doctor before ending isolation. °Take precautions until day 10 °Wear a well-fitting mask °Wear a well-fitting mask for 10 full days any time you are around others inside your home or in public. Do not go to places where you are unable to wear a well-fitting mask. °Do not travel °Do not travel until a full 10 days after your symptoms started or the date your positive test was taken if you had no symptoms. °Avoid being around people who are more likely to get very sick from COVID-19. °Definitions °Exposure °Contact with someone infected with SARS-CoV-2, the virus that causes COVID-19, in a way that increases the likelihood of getting infected with the virus. °Close contact °A close contact is someone who was less than 6 feet away from an infected person (laboratory-confirmed or a clinical diagnosis) for a cumulative total of 15 minutes or more over a 24-hour period. For example, three individual 5-minute exposures for a total of 15 minutes. People who are exposed to someone with COVID-19 after they completed at least 5 days of isolation are not considered close contacts. °Quarantine °Quarantine is a strategy used to prevent transmission of COVID-19 by keeping people who have been in close contact with someone with COVID-19 apart from others. °Who does not need to quarantine? °If you had close contact with  someone with COVID-19 and you are in one of the following groups, you do not need to quarantine. °You are up to date with your COVID-19 vaccines. °You had confirmed COVID-19 within the last 90 days (meaning you tested positive using a viral test). °If you are up to date with COVID-19 vaccines, you should wear a well-fitting mask around others for 10 days from the date of your last close contact with someone with COVID-19 (the date of last close contact is considered day 0). Get tested at least 5 days after you last had close contact with someone with COVID-19. If you test positive or develop COVID-19 symptoms, isolate from other people and follow recommendations in the Isolation section below. If you tested positive for COVID-19 with a viral test within the previous 90 days and subsequently recovered and remain without COVID-19 symptoms, you do not need to quarantine or get tested after close contact. You should wear a well-fitting mask around others for 10 days from the date of your last close contact with someone with COVID-19 (the date of last   close contact is considered day 0). If you have COVID-19 symptoms, get tested and isolate from other people and follow recommendations in the Isolation section below. °Who should quarantine? °If you come into close contact with someone with COVID-19, you should quarantine if you are not up to date on COVID-19 vaccines. This includes people who are not vaccinated. °What to do for quarantine °Stay home and away from other people for at least 5 days (day 0 through day 5) after your last contact with a person who has COVID-19. The date of your exposure is considered day 0. Wear a well-fitting mask when around others at home, if possible. °For 10 days after your last close contact with someone with COVID-19, watch for fever (100.4°F or greater), cough, shortness of breath, or other COVID-19 symptoms. °If you develop symptoms, get tested immediately and isolate until you receive  your test results. If you test positive, follow isolation recommendations. °If you do not develop symptoms, get tested at least 5 days after you last had close contact with someone with COVID-19. °If you test negative, you can leave your home, but continue to wear a well-fitting mask when around others at home and in public until 10 days after your last close contact with someone with COVID-19. °If you test positive, you should isolate for at least 5 days from the date of your positive test (if you do not have symptoms). If you do develop COVID-19 symptoms, isolate for at least 5 days from the date your symptoms began (the date the symptoms started is day 0). Follow recommendations in the isolation section below. °If you are unable to get a test 5 days after last close contact with someone with COVID-19, you can leave your home after day 5 if you have been without COVID-19 symptoms throughout the 5-day period. Wear a well-fitting mask for 10 days after your date of last close contact when around others at home and in public. °Avoid people who are have weakened immune systems or are more likely to get very sick from COVID-19, and nursing homes and other high-risk settings, until after at least 10 days. °If possible, stay away from people you live with, especially people who are at higher risk for getting very sick from COVID-19, as well as others outside your home throughout the full 10 days after your last close contact with someone with COVID-19. °If you are unable to quarantine, you should wear a well-fitting mask for 10 days when around others at home and in public. °If you are unable to wear a mask when around others, you should continue to quarantine for 10 days. Avoid people who have weakened immune systems or are more likely to get very sick from COVID-19, and nursing homes and other high-risk settings, until after at least 10 days. °See additional information about travel. °Do not go to places where you are  unable to wear a mask, such as restaurants and some gyms, and avoid eating around others at home and at work until after 10 days after your last close contact with someone with COVID-19. °After quarantine °Watch for symptoms until 10 days after your last close contact with someone with COVID-19. °If you have symptoms, isolate immediately and get tested. °Quarantine in high-risk congregate settings °In certain congregate settings that have high risk of secondary transmission (such as correctional and detention facilities, homeless shelters, or cruise ships), CDC recommends a 10-day quarantine for residents, regardless of vaccination and booster status. During periods of critical staffing   shortages, facilities may consider shortening the quarantine period for staff to ensure continuity of operations. Decisions to shorten quarantine in these settings should be made in consultation with state, local, tribal, or territorial health departments and should take into consideration the context and characteristics of the facility. CDC's setting-specific guidance provides additional recommendations for these settings. °Isolation °Isolation is used to separate people with confirmed or suspected COVID-19 from those without COVID-19. People who are in isolation should stay home until it's safe for them to be around others. At home, anyone sick or infected should separate from others, or wear a well-fitting mask when they need to be around others. People in isolation should stay in a specific "sick room" or area and use a separate bathroom if available. Everyone who has presumed or confirmed COVID-19 should stay home and isolate from other people for at least 5 full days (day 0 is the first day of symptoms or the date of the day of the positive viral test for asymptomatic persons). They should wear a mask when around others at home and in public for an additional 5 days. People who are confirmed to have COVID-19 or are showing  symptoms of COVID-19 need to isolate regardless of their vaccination status. This includes: °People who have a positive viral test for COVID-19, regardless of whether or not they have symptoms. °People with symptoms of COVID-19, including people who are awaiting test results or have not been tested. People with symptoms should isolate even if they do not know if they have been in close contact with someone with COVID-19. °What to do for isolation °Monitor your symptoms. If you have an emergency warning sign (including trouble breathing), seek emergency medical care immediately. °Stay in a separate room from other household members, if possible. °Use a separate bathroom, if possible. °Take steps to improve ventilation at home, if possible. °Avoid contact with other members of the household and pets. °Don't share personal household items, like cups, towels, and utensils. °Wear a well-fitting mask when you need to be around other people. °Learn more about what to do if you are sick and how to notify your contacts. °Ending isolation for people who had COVID-19 and had symptoms °If you had COVID-19 and had symptoms, isolate for at least 5 days. To calculate your 5-day isolation period, day 0 is your first day of symptoms. Day 1 is the first full day after your symptoms developed. You can leave isolation after 5 full days. °You can end isolation after 5 full days if you are fever-free for 24 hours without the use of fever-reducing medication and your other symptoms have improved (Loss of taste and smell may persist for weeks or months after recovery and need not delay the end of isolation). °You should continue to wear a well-fitting mask around others at home and in public for 5 additional days (day 6 through day 10) after the end of your 5-day isolation period. If you are unable to wear a mask when around others, you should continue to isolate for a full 10 days. Avoid people who have weakened immune systems or are more  likely to get very sick from COVID-19, and nursing homes and other high-risk settings, until after at least 10 days. °If you continue to have fever or your other symptoms have not improved after 5 days of isolation, you should wait to end your isolation until you are fever-free for 24 hours without the use of fever-reducing medication and your other symptoms have improved.   Continue to wear a well-fitting mask through day 10. Contact your healthcare provider if you have questions. °See additional information about travel. °Do not go to places where you are unable to wear a mask, such as restaurants and some gyms, and avoid eating around others at home and at work until a full 10 days after your first day of symptoms. °If an individual has access to a test and wants to test, the best approach is to use an antigen test1 towards the end of the 5-day isolation period. Collect the test sample only if you are fever-free for 24 hours without the use of fever-reducing medication and your other symptoms have improved (loss of taste and smell may persist for weeks or months after recovery and need not delay the end of isolation). If your test result is positive, you should continue to isolate until day 10. If your test result is negative, you can end isolation, but continue to wear a well-fitting mask around others at home and in public until day 10. Follow additional recommendations for masking and avoiding travel as described above. °1As noted in the labeling for authorized over-the counter antigen tests: Negative results should be treated as presumptive. Negative results do not rule out SARS-CoV-2 infection and should not be used as the sole basis for treatment or patient management decisions, including infection control decisions. To improve results, antigen tests should be used twice over a three-day period with at least 24 hours and no more than 48 hours between tests. °Note that these recommendations on ending isolation  do not apply to people who are moderately ill or very sick from COVID-19 or have weakened immune systems. See section below for recommendations for when to end isolation for these groups. °Ending isolation for people who tested positive for COVID-19 but had no symptoms °If you test positive for COVID-19 and never develop symptoms, isolate for at least 5 days. Day 0 is the day of your positive viral test (based on the date you were tested) and day 1 is the first full day after the specimen was collected for your positive test. You can leave isolation after 5 full days. °If you continue to have no symptoms, you can end isolation after at least 5 days. °You should continue to wear a well-fitting mask around others at home and in public until day 10 (day 6 through day 10). If you are unable to wear a mask when around others, you should continue to isolate for 10 days. Avoid people who have weakened immune systems or are more likely to get very sick from COVID-19, and nursing homes and other high-risk settings, until after at least 10 days. °If you develop symptoms after testing positive, your 5-day isolation period should start over. Day 0 is your first day of symptoms. Follow the recommendations above for ending isolation for people who had COVID-19 and had symptoms. °See additional information about travel. °Do not go to places where you are unable to wear a mask, such as restaurants and some gyms, and avoid eating around others at home and at work until 10 days after the day of your positive test. °If an individual has access to a test and wants to test, the best approach is to use an antigen test1 towards the end of the 5-day isolation period. If your test result is positive, you should continue to isolate until day 10. If your test result is positive, you can also choose to test daily and if your test result   is negative, you can end isolation, but continue to wear a well-fitting mask around others at home and in  public until day 10. Follow additional recommendations for masking and avoiding travel as described above. °1As noted in the labeling for authorized over-the counter antigen tests: Negative results should be treated as presumptive. Negative results do not rule out SARS-CoV-2 infection and should not be used as the sole basis for treatment or patient management decisions, including infection control decisions. To improve results, antigen tests should be used twice over a three-day period with at least 24 hours and no more than 48 hours between tests. °Ending isolation for people who were moderately or very sick from COVID-19 or have a weakened immune system °People who are moderately ill from COVID-19 (experiencing symptoms that affect the lungs like shortness of breath or difficulty breathing) should isolate for 10 days and follow all other isolation precautions. To calculate your 10-day isolation period, day 0 is your first day of symptoms. Day 1 is the first full day after your symptoms developed. If you are unsure if your symptoms are moderate, talk to a healthcare provider for further guidance. °People who are very sick from COVID-19 (this means people who were hospitalized or required intensive care or ventilation support) and people who have weakened immune systems might need to isolate at home longer. They may also require testing with a viral test to determine when they can be around others. CDC recommends an isolation period of at least 10 and up to 20 days for people who were very sick from COVID-19 and for people with weakened immune systems. Consult with your healthcare provider about when you can resume being around other people. If you are unsure if your symptoms are severe or if you have a weakened immune system, talk to a healthcare provider for further guidance. °People who have a weakened immune system should talk to their healthcare provider about the potential for reduced immune responses to  COVID-19 vaccines and the need to continue to follow current prevention measures (including wearing a well-fitting mask and avoiding crowds and poorly ventilated indoor spaces) to protect themselves against COVID-19 until advised otherwise by their healthcare provider. Close contacts of immunocompromised people--including household members--should also be encouraged to receive all recommended COVID-19 vaccine doses to help protect these people. °Isolation in high-risk congregate settings °In certain high-risk congregate settings that have high risk of secondary transmission and where it is not feasible to cohort people (such as correctional and detention facilities, homeless shelters, and cruise ships), CDC recommends a 10-day isolation period for residents. During periods of critical staffing shortages, facilities may consider shortening the isolation period for staff to ensure continuity of operations. Decisions to shorten isolation in these settings should be made in consultation with state, local, tribal, or territorial health departments and should take into consideration the context and characteristics of the facility. CDC's setting-specific guidance provides additional recommendations for these settings. °This CDC guidance is meant to supplement--not replace--any federal, state, local, territorial, or tribal health and safety laws, rules, and regulations. °Recommendations for specific settings °These recommendations do not apply to healthcare professionals. For guidance specific to these settings, see °Healthcare professionals: Interim Guidance for Managing Healthcare Personnel with SARS-CoV-2 Infection or Exposure to SARS-CoV-2 °Patients, residents, and visitors to healthcare settings: Interim Infection Prevention and Control Recommendations for Healthcare Personnel During the Coronavirus Disease 2019 (COVID-19) Pandemic °Additional setting-specific guidance and recommendations are available. °These  recommendations on quarantine and isolation do apply to K[employer] School   settings. Additional guidance is available here: Overview of COVID-19 Quarantine for K-12 Schools °Travelers: Travel information and recommendations °Congregate facilities and other settings: guidance pages for community, work, and school settings °Ongoing COVID-19 exposure FAQs °I live with someone with COVID-19, but I cannot be separated from them. How do we manage quarantine in this situation? °It is very important for people with COVID-19 to remain apart from other people, if possible, even if they are living together. If separation of the person with COVID-19 from others that they live with is not possible, the other people that they live with will have ongoing exposure, meaning they will be repeatedly exposed until that person is no longer able to spread the virus to other people. In this situation, there are precautions you can take to limit the spread of COVID-19: °The person with COVID-19 and everyone they live with should wear a well-fitting mask inside the home. °If possible, one person should care for the person with COVID-19 to limit the number of people who are in close contact with the infected person. °Take steps to protect yourself and others to reduce transmission in the home: °Quarantine if you are not up to date with your COVID-19 vaccines. °Isolate if you are sick or tested positive for COVID-19, even if you don't have symptoms. °Learn more about the public health recommendations for testing, mask use and quarantine of close contacts, like yourself, who have ongoing exposure. These recommendations differ depending on your vaccination status. °What should I do if I have ongoing exposure to COVID-19 from someone I live with? °Recommendations for this situation depend on your vaccination status: °If you are not up to date on COVID-19 vaccines and have ongoing exposure to COVID-19, you should: °Begin quarantine immediately and  continue to quarantine throughout the isolation period of the person with COVID-19. °Continue to quarantine for an additional 5 days starting the day after the end of isolation for the person with COVID-19. °Get tested at least 5 days after the end of isolation of the infected person that lives with them. °If you test negative, you can leave the home but should continue to wear a well-fitting mask when around others at home and in public until 10 days after the end of isolation for the person with COVID-19. °Isolate immediately if you develop symptoms of COVID-19 or test positive. °If you are up to date with COVID-19 vaccines and have ongoing exposure to COVID-19, you should: °Get tested at least 5 days after your first exposure. A person with COVID-19 is considered infectious starting 2 days before they develop symptoms, or 2 days before the date of their positive test if they do not have symptoms. °Get tested again at least 5 days after the end of isolation for the person with COVID-19. °Wear a well-fitting mask when you are around the person with COVID-19, and do this throughout their isolation period. °Wear a well-fitting mask around others for 10 days after the infected person's isolation period ends. °Isolate immediately if you develop symptoms of COVID-19 or test positive. °What should I do if multiple people I live with test positive for COVID-19 at different times? °Recommendations for this situation depend on your vaccination status: °If you are not up to date with your COVID-19 vaccines, you should: °Quarantine throughout the isolation period of any infected person that you live with. °Continue to quarantine until 5 days after the end of isolation date for the most recently infected person that lives with you. For example, if   the last day of isolation of the person most recently infected with COVID-19 was June 30, the new 5-day quarantine period starts on July 1. °Get tested at least 5 days after the end  of isolation for the most recently infected person that lives with you. °Wear a well-fitting mask when you are around any person with COVID-19 while that person is in isolation. °Wear a well-fitting mask when you are around other people until 10 days after your last close contact. °Isolate immediately if you develop symptoms of COVID-19 or test positive. °If you are up to date with your COVID-19 vaccines, you should: °Get tested at least 5 days after your first exposure. A person with COVID-19 is considered infectious starting 2 days before they developed symptoms, or 2 days before the date of their positive test if they do not have symptoms. °Get tested again at least 5 days after the end of isolation for the most recently infected person that lives with you. °Wear a well-fitting mask when you are around any person with COVID-19 while that person is in isolation. °Wear a well-fitting mask around others for 10 days after the end of isolation for the most recently infected person that lives with you. For example, if the last day of isolation for the person most recently infected with COVID-19 was June 30, the new 10-day period to wear a well-fitting mask indoors in public starts on July 1. °Isolate immediately if you develop symptoms of COVID-19 or test positive. °I had COVID-19 and completed isolation. Do I have to quarantine or get tested if someone I live with gets COVID-19 shortly after I completed isolation? °No. If you recently completed isolation and someone that lives with you tests positive for the virus that causes COVID-19 shortly after the end of your isolation period, you do not have to quarantine or get tested as long as you do not develop new symptoms. Once all of the people that live together have completed isolation or quarantine, refer to the guidance below for new exposures to COVID-19. °If you had COVID-19 in the previous 90 days and then came into close contact with someone with COVID-19, you do  not have to quarantine or get tested if you do not have symptoms. But you should: °Wear a well-fitting mask indoors in public for 10 days after your last close contact. °Monitor for COVID-19 symptoms for 10 days from the date of your last close contact. °Isolate immediately and get tested if symptoms develop. °If more than 90 days have passed since your recovery from infection, follow CDC's recommendations for close contacts. These recommendations will differ depending on your vaccination status. °01/01/2021 °Content source: National Center for Immunization and Respiratory Diseases (NCIRD), Division of Viral Diseases °This information is not intended to replace advice given to you by your health care provider. Make sure you discuss any questions you have with your health care provider. °Document Revised: 05/07/2021 Document Reviewed: 05/07/2021 °Elsevier Patient Education © 2022 Elsevier Inc. ° °

## 2021-09-04 ENCOUNTER — Other Ambulatory Visit: Payer: Self-pay | Admitting: Nurse Practitioner

## 2021-09-04 DIAGNOSIS — U071 COVID-19: Secondary | ICD-10-CM

## 2021-09-04 MED ORDER — MOLNUPIRAVIR EUA 200MG CAPSULE: 4.0000 | ORAL_CAPSULE | Freq: Two times a day (BID) | ORAL | 0 refills | Status: AC

## 2021-09-04 NOTE — Progress Notes (Signed)
They are not doing moncolonal antibodies anymore at the infusion center so will send her antiviral med. Molnupiravir.and chest xray. Advised patient if she gets any worse to go to the emergency room.

## 2021-09-12 DIAGNOSIS — M654 Radial styloid tenosynovitis [de Quervain]: Secondary | ICD-10-CM | POA: Diagnosis not present

## 2021-09-12 DIAGNOSIS — M79642 Pain in left hand: Secondary | ICD-10-CM | POA: Diagnosis not present

## 2021-09-15 ENCOUNTER — Encounter: Payer: Self-pay | Admitting: Internal Medicine

## 2021-09-19 ENCOUNTER — Ambulatory Visit (INDEPENDENT_AMBULATORY_CARE_PROVIDER_SITE_OTHER): Payer: BC Managed Care – PPO

## 2021-09-19 ENCOUNTER — Other Ambulatory Visit: Payer: Self-pay

## 2021-09-19 DIAGNOSIS — J4550 Severe persistent asthma, uncomplicated: Secondary | ICD-10-CM | POA: Diagnosis not present

## 2021-09-19 DIAGNOSIS — M654 Radial styloid tenosynovitis [de Quervain]: Secondary | ICD-10-CM | POA: Diagnosis not present

## 2021-09-19 DIAGNOSIS — M79642 Pain in left hand: Secondary | ICD-10-CM | POA: Diagnosis not present

## 2021-10-02 ENCOUNTER — Other Ambulatory Visit: Payer: Self-pay

## 2021-10-02 ENCOUNTER — Encounter: Payer: Self-pay | Admitting: Internal Medicine

## 2021-10-02 MED ORDER — PANTOPRAZOLE SODIUM 40 MG PO TBEC
DELAYED_RELEASE_TABLET | ORAL | 0 refills | Status: DC
Start: 2021-10-02 — End: 2021-10-29

## 2021-10-06 ENCOUNTER — Telehealth: Payer: Self-pay | Admitting: Allergy & Immunology

## 2021-10-06 NOTE — Telephone Encounter (Signed)
Pt states Trelegy is not working for her, she would like to talk about an alternative.

## 2021-10-06 NOTE — Telephone Encounter (Signed)
Please advise to change in haler as trelegy is not helping her

## 2021-10-07 ENCOUNTER — Other Ambulatory Visit: Payer: Self-pay | Admitting: *Deleted

## 2021-10-07 ENCOUNTER — Encounter: Payer: Self-pay | Admitting: Neurology

## 2021-10-07 ENCOUNTER — Ambulatory Visit: Payer: BC Managed Care – PPO | Admitting: Neurology

## 2021-10-07 VITALS — BP 114/76 | HR 79 | Ht 67.0 in | Wt 293.5 lb

## 2021-10-07 DIAGNOSIS — G4710 Hypersomnia, unspecified: Secondary | ICD-10-CM | POA: Diagnosis not present

## 2021-10-07 DIAGNOSIS — G4733 Obstructive sleep apnea (adult) (pediatric): Secondary | ICD-10-CM | POA: Diagnosis not present

## 2021-10-07 DIAGNOSIS — I4891 Unspecified atrial fibrillation: Secondary | ICD-10-CM | POA: Diagnosis not present

## 2021-10-07 DIAGNOSIS — J4540 Moderate persistent asthma, uncomplicated: Secondary | ICD-10-CM

## 2021-10-07 DIAGNOSIS — R5383 Other fatigue: Secondary | ICD-10-CM | POA: Insufficient documentation

## 2021-10-07 DIAGNOSIS — Z9989 Dependence on other enabling machines and devices: Secondary | ICD-10-CM

## 2021-10-07 DIAGNOSIS — Z72820 Sleep deprivation: Secondary | ICD-10-CM | POA: Insufficient documentation

## 2021-10-07 MED ORDER — ARMODAFINIL 150 MG PO TABS: 150.0000 mg | ORAL_TABLET | Freq: Every day | ORAL | 5 refills | Status: DC

## 2021-10-07 MED ORDER — SYMBICORT 160-4.5 MCG/ACT IN AERO: 2.0000 | INHALATION_SPRAY | Freq: Two times a day (BID) | RESPIRATORY_TRACT | 1 refills | Status: DC

## 2021-10-07 NOTE — Patient Instructions (Signed)
Suvorexant Tablets °What is this medication? °SUVOREXANT (SOO voe REX ant) treats insomnia. It helps you go to sleep faster and stay asleep through the night. °This medicine may be used for other purposes; ask your health care provider or pharmacist if you have questions. °COMMON BRAND NAME(S): Belsomra °What should I tell my care team before I take this medication? °They need to know if you have any of these conditions: °Depression °History of substance abuse or addiction °History of a sudden onset of muscle weakness (cataplexy) °History of falling asleep often at unexpected times (narcolepsy) °If you often drink alcohol °Liver disease °Lung or breathing disease °Sleep apnea °Sleep-walking, driving, eating or other activity while not fully awake after taking a sleep medication °Suicidal thoughts, plans, or attempt; a previous suicide attempt by you or a family member °An unusual or allergic reaction to suvorexant, other medications, foods, dyes, or preservatives °Pregnant or trying to get pregnant °Breast-feeding °How should I use this medication? °Take this medication by mouth with water 30 minutes before going to bed. Follow the directions on the prescription label. It is better to take this medication on an empty stomach. Do not take your medication more often than directed. °A special MedGuide will be given to you by the pharmacist with each prescription and refill. Be sure to read this information carefully each time. °Talk to your care team about the use of this medication in children. Special care may be needed. °Overdosage: If you think you have taken too much of this medicine contact a poison control center or emergency room at once. °NOTE: This medicine is only for you. Do not share this medicine with others. °What if I miss a dose? °This does not apply. This medication should only be taken as directed before going to sleep. Do not take double or extra doses. °What may interact with this  medication? °Alcohol °Antihistamines for allergy, cough, or cold °Certain antibiotics, such as erythromycin or clarithromycin °Certain antivirals for HIV or hepatitis °Certain medications for fungal infections, such as itraconazole, ketoconazole, posaconazole °Certain medications for mental health conditions °Certain medications for seizures, such as carbamazepine, phenytoin, phenobarbital °Conivaptan °Diltiazem °General anesthetics, such as halothane, isoflurane, methoxyflurane, propofol °Grapefruit juice °Medications that relax muscles for surgery °Opioid medications for pain °Other medications for sleep °Rifampin °St. John's Wort °Verapamil °This list may not describe all possible interactions. Give your health care provider a list of all the medicines, herbs, non-prescription drugs, or dietary supplements you use. Also tell them if you smoke, drink alcohol, or use illegal drugs. Some items may interact with your medicine. °What should I watch for while using this medication? °Visit your care team for regular checks on your progress. Keep a regular sleep schedule by going to bed at about the same time each night. Avoid caffeine-containing drinks in the evening hours. Talk to your care team if your insomnia worsens or is not better within 7 to 10 days. °After taking this medication, you may get up out of bed and do an activity that you do not know you are doing. The next morning, you may have no memory of this. Activities include driving a car ("sleep-driving"), making and eating food, talking on the phone, sexual activity, and sleep-walking. Serious injuries have occurred. Stop the medication and call your care team right away if you find out you have done any of these activities. Do not take this medication if you have used alcohol that evening. Do not take it if you have taken another   medication for sleep. The risk of doing these sleep-related activities is higher. °Do not take this medication unless you are  able to stay in bed for a full night (7 to 8 hours) before you must be active again. Tell your care team if you will need to perform activities requiring full alertness, such as driving, the next day. You may have a decrease in mental alertness the day after use, even if you feel that you are fully awake. Do not stand or sit up quickly after taking this medication, especially if you are an older patient. This reduces the risk of dizzy or fainting spells. °If you or your family notice any changes in your moods or behavior, such as new or worsening depression, thoughts of harming yourself, anxiety, other unusual or disturbing thoughts, or memory loss, call your care team right away. °After you stop taking this medication, you may have trouble falling asleep. This is called rebound insomnia. This problem usually goes away on its own after 1 or 2 nights. °What side effects may I notice from receiving this medication? °Side effects that you should report to your care team as soon as possible: °Allergic reactions--skin rash, itching, hives, swelling of the face, lips, tongue, or throat °CNS depression--slow or shallow breathing, shortness of breath, feeling faint, dizziness, confusion, trouble staying awake °Mood and behavior changes--anxiety, nervousness, confusion, hallucinations, irritability, hostility, thoughts of suicide or self-harm, worsening mood, feelings of depression °Sudden and temporary muscle weakness °Unable to move or speak for several minutes upon waking or going to sleep °Unusual sleep behaviors or activities you do not remember, such as driving, eating, or sexual activity °Side effects that usually do not require medical attention (report these to your care team if they continue or are bothersome): °Drowsiness the day after use °Vivid dreams or nightmares °This list may not describe all possible side effects. Call your doctor for medical advice about side effects. You may report side effects to FDA at  1-800-FDA-1088. °Where should I keep my medication? °Keep out of the reach of children and pets. This medication can be abused. Keep it in a safe place to protect it from theft. Do not share it with anyone. It is only for you. Selling or giving away this medication is dangerous and against the law. °Store between 20 and 25 degrees C (68 and 77 degrees F). Protect from light and moisture. Keep the container tightly closed. Get rid of any unused medication after the expiration date. °This medication may cause harm and death if it is taken by other adults, children, or pets. It is important to get rid of the medication as soon as you no longer need it or it is expired. You can do this in two ways: °Take the medication to a medication take-back program. Check with your pharmacy or law enforcement to find a location. °If you cannot return the medication, check the label or package insert to see if the medication should be thrown out in the garbage or flushed down the toilet. If you are not sure, ask your care team. If it is safe to put it in the trash, take the medication out of the container. Mix the medication with cat litter, dirt, coffee grounds, or other unwanted substance. Seal the mixture in a bag or container. Put it in the trash. °NOTE: This sheet is a summary. It may not cover all possible information. If you have questions about this medicine, talk to your doctor, pharmacist, or health care provider. °©   2022 Elsevier/Gold Standard (2021-05-15 00:00:00) Fatigue If you have fatigue, you feel tired all the time and have a lack of energy or a lack of motivation. Fatigue may make it difficult to start or complete tasks because of exhaustion. In general, occasional or mild fatigue is often a normal response to activity or life. However, long-lasting (chronic) or extreme fatigue may be a symptom of a medical condition. Follow these instructions at home: General instructions Watch your fatigue for any  changes. Go to bed and get up at the same time every day. Avoid fatigue by pacing yourself during the day and getting enough sleep at night. Maintain a healthy weight. Medicines Take over-the-counter and prescription medicines only as told by your health care provider. Take a multivitamin, if told by your health care provider.  Do not use herbal or dietary supplements unless they are approved by your health care provider. Activity  Exercise regularly, as told by your health care provider. Use or practice techniques to help you relax, such as yoga, tai chi, meditation, or massage therapy. Eating and drinking  Avoid heavy meals in the evening. Eat a well-balanced diet, which includes lean proteins, whole grains, plenty of fruits and vegetables, and low-fat dairy products. Avoid consuming too much caffeine. Avoid the use of alcohol. Drink enough fluid to keep your urine pale yellow. Lifestyle Change situations that cause you stress. Try to keep your work and personal schedule in balance. Do not use any products that contain nicotine or tobacco, such as cigarettes and e-cigarettes. If you need help quitting, ask your health care provider. Do not use drugs. Contact a health care provider if: Your fatigue does not get better. You have a fever. You suddenly lose or gain weight. You have headaches. You have trouble falling asleep or sleeping through the night. You feel angry, guilty, anxious, or sad. You are unable to have a bowel movement (constipation). Your skin is dry. You have swelling in your legs or another part of your body. Get help right away if: You feel confused. Your vision is blurry. You feel faint or you pass out. You have a severe headache. You have severe pain in your abdomen, your back, or the area between your waist and hips (pelvis). You have chest pain, shortness of breath, or an irregular or fast heartbeat. You are unable to urinate, or you urinate less than  normal. You have abnormal bleeding, such as bleeding from the rectum, vagina, nose, lungs, or nipples. You vomit blood. You have thoughts about hurting yourself or others. If you ever feel like you may hurt yourself or others, or have thoughts about taking your own life, get help right away. You can go to your nearest emergency department or call: Your local emergency services (911 in the [place] A suicide crisis helpline, such as the [facility] at 423-157-0602 or 988 in the [place]. This is open 24 hours a day. Summary If you have fatigue, you feel tired all the time and have a lack of energy or a lack of motivation. Fatigue may make it difficult to start or complete tasks because of exhaustion. Long-lasting (chronic) or extreme fatigue may be a symptom of a medical condition. Exercise regularly, as told by your health care provider. Change situations that cause you stress. Try to keep your work and personal schedule in balance. This information is not intended to replace advice given to you by your health care provider. Make sure you discuss any questions you have with  your health care provider. °Document Revised: 04/16/2021 Document Reviewed: 08/01/2020 °Elsevier Patient Education © 2022 Elsevier Inc. °Quality Sleep Information, Adult °Quality sleep is important for your mental and physical health. It also improves your quality of life. Quality sleep means you: °Are asleep for most of the time you are in bed. °Fall asleep within 30 minutes. °Wake up no more than once a night.  °Are awake for no longer than 20 minutes if you do wake up during the night. °Most adults need 7-8 hours of quality sleep each night. °How can poor sleep affect me? °If you do not get enough quality sleep, you may have: °Mood swings. °Daytime sleepiness. °Confusion. °Decreased reaction time. °Sleep disorders, such as insomnia and sleep apnea. °Difficulty with: °Solving problems. °Coping with  stress. °Paying attention. °These issues may affect your performance and productivity at work, school, and at home. Lack of sleep may also put you at higher risk for accidents, suicide, and risky behaviors. °If you do not get quality sleep you may also be at higher risk for several health problems, including: °Infections. °Type 2 diabetes. °Heart disease. °High blood pressure. °Obesity. °Worsening of long-term conditions, like arthritis, kidney disease, depression, Parkinson's disease, and epilepsy. °What actions can I take to get more quality sleep? °  °Stick to a sleep schedule. Go to sleep and wake up at about the same time each day. Do not try to sleep less on weekdays and make up for lost sleep on weekends. This does not work. °Try to get about 30 minutes of exercise on most days. Do not exercise 2-3 hours before going to bed. °Limit naps during the day to 30 minutes or less. °Do not use any products that contain nicotine or tobacco, such as cigarettes or e-cigarettes. If you need help quitting, ask your health care provider. °Do not drink caffeinated beverages for at least 8 hours before going to bed. Coffee, tea, and some sodas contain caffeine. °Do not drink alcohol close to bedtime. °Do not eat large meals close to bedtime. °Do not take naps in the late afternoon. °Try to get at least 30 minutes of sunlight every day. Morning sunlight is best. °Make time to relax before bed. Reading, listening to music, or taking a hot bath promotes quality sleep. °Make your bedroom a place that promotes quality sleep. Keep your bedroom dark, quiet, and at a comfortable room temperature. Make sure your bed is comfortable. Take out sleep distractions like TV, a computer, smartphone, and bright lights. °If you are lying awake in bed for longer than 20 minutes, get up and do a relaxing activity until you feel sleepy. °Work with your health care provider to treat medical conditions that may affect sleeping, such as: °Nasal  obstruction. °Snoring. °Sleep apnea and other sleep disorders. °Talk to your health care provider if you think any of your prescription medicines may cause you to have difficulty falling or staying asleep. °If you have sleep problems, talk with a sleep consultant. If you think you have a sleep disorder, talk with your health care provider about getting evaluated by a specialist. °Where to find more information °National Sleep Foundation website: https://sleepfoundation.org °National Heart, Lung, and Blood Institute (NHLBI): www.nhlbi.nih.gov/files/docs/public/sleep/healthy_sleep.pdf °Centers for Disease Control and Prevention (CDC): www.cdc.gov/sleep/index.html °Contact a health care provider if you: °Have trouble getting to sleep or staying asleep. °Often wake up very early in the morning and cannot get back to sleep. °Have daytime sleepiness. °Have daytime sleep attacks of suddenly falling asleep and sudden muscle   weakness (narcolepsy). °Have a tingling sensation in your legs with a strong urge to move your legs (restless legs syndrome). °Stop breathing briefly during sleep (sleep apnea). °Think you have a sleep disorder or are taking a medicine that is affecting your quality of sleep. °Summary °Most adults need 7-8 hours of quality sleep each night. °Getting enough quality sleep is an important part of health and well-being. °Make your bedroom a place that promotes quality sleep and avoid things that may cause you to have poor sleep, such as alcohol, caffeine, smoking, and large meals. °Talk to your health care provider if you have trouble falling asleep or staying asleep. °This information is not intended to replace advice given to you by your health care provider. Make sure you discuss any questions you have with your health care provider. °Document Revised: 12/29/2017 Document Reviewed: 12/29/2017 °Elsevier Patient Education © 2022 Elsevier Inc. ° °

## 2021-10-07 NOTE — Telephone Encounter (Signed)
New prescription has been sent in. Patient is scheduled for an office visit later this month. Called patient and left a voicemail asking for a return call to inform.

## 2021-10-07 NOTE — Telephone Encounter (Signed)
She has been on Breztri and that did not seem to work.  Let's change to Symbicort 160/4.22mcg two puffs BID and see how she does with that. We may need to add on another one (to include the triple medication coverage for asthma), but maybe with the Nucala this won't be necessary.   She also needs an appt.   Malachi Bonds, MD [facility]

## 2021-10-07 NOTE — Progress Notes (Signed)
SLEEP MEDICINE CLINIC   Provider:  Larey Seat, M.D.   Referring Provider: Glendale Chard, MD Primary Care Physician:  Glendale Chard, MD  Chief Complaint  Patient presents with   Consult    Rm 10, alone. Pt returns to f/u on sleep concerns. Last seen on 08/09/18, last HST done on 08/12/16. Pt currently using CPAP. Reports not being consistent with CPAP. DME [facility], has not been sent any supplies in over a year. Pt reports diffciutlty sleeping, fatigue due to lack of sleep.     Chief complaint according to patient : "I cannot get enough sleep" and  " I am just very tired all the time".  I have a CPAP but I didn't bring it and I wasn't sent any supplies for 12 months .    HPI:  Tracey Escobar is a 51 y.o. female patient , seen here as a CONSULT on 08-09-2018 from Dr. Baird Cancer  ( after a 3 year hiatus) for a new sleep consult.  The sleep test is now 51 years old and I like to repeat it, she reports sleepiness on CPAP, worsening fatigue, need for naps-  has fallen asleep at her desk. She is not working from home any longer, felt distracted at home.   She had been seen in 10-02-2018 and had a sleep study on that day-  Previously seen in 2017 for a sleep consult, but in the interval there have been medical developments.  She has been hospitalized every of the last 3 years , sometimes overnight, some ED visits, mostly for atrial fibrillation. She is a CPAP user since 09-2018- set up with CPAP after that. [facility] at that time, now adapt.  She had an overnight PSG in 09-2018. The total APNEA/HYPOPNEA INDEX (AHI) was 9.7  /hour. 17 events occurred in REM sleep and 116 events in NREM.  The REM AHI was 14.8 /hour, versus a non-REM AHI of 8.8/h. The patient spent 146 minutes of total sleep time in the supine  position and 318 minutes in non-supine. The supine AHI was 25.1 versus a non-supine AHI of 2.6.  OXYGEN SATURATION & C02:  The Wake baseline 02 saturation was  96%, with the lowest  being 88%. Time spent below 89% saturation equaled 0 minutes.   The sleep test is now 51 years old and I like to repeat it, she reports sleepiness on CPAP, worsening fatigue, need for naps-  has fallen asleep at her desk. She is not working from home.    Tracey Escobar was previously tested for apnea , which was ruled out and an apnea link home sleep test from 12 August 2016. Her AHI was 3.9/h her RDI was 8/h, oxygen desaturation index was 5.9/h and she did not have prolonged hypoxemia time.  Average pulse rate was 88 bpm which is rather high for sleep. Meanwhile she had 2 hospitalizations in March 2018 and in March 2019 for atrial fibrillation-  She continues to feel sleepy at work, has fallen asleep at her desk,  yawning all day, and having no energy to exercise.  She goes to bed by 9 PM and is asleep by 10 PM, wakes up between 2 and 3 AM every morning, rises since she is not able to go back to sleep.   Notes from 07-13-2016 our first consult  Tracey Escobar is a 51 year old female patient referred for a sleep evaluation based on the feeling of chronic and intense fatigue. She feels tired all the time. She has been  treated in the past for gastric reflux disease, asthma, vitamin D deficiency, and hypertension. She has had weight gain, but has no thyroid disease and no diabetes.  She feels tired and "never " got 8 hours of sleep. Some nights she will not sleep, just doze.  Sleep habits are as follows 08-09-2018 : Tracey Escobar is still used to sleep with the TV in the background- she feels hypervigilant without sound-scape, has not put in on a timer.  She has however experience that his sleep sounder if the TV is not running. She feels TV helps her to fall asleep not to stay asleep. The bedroom is otherwise cool, quiet. She prefers to sleep on her side, with 2 pillows supporting her head. She always wakes up between 2 and 3 AM , wakes gasping, choking and sometimes from a dream- sometimes from urge to  urinate.  Often unable to return to sleep or just being in a light daze. Her alarm clock is set for 6:15 AM  She wakes up frequently,  her sleep is fragmented,  and she watches TV again when waking up - and she has multiple bathroom breaks.  Sometimes she wakes up with headaches , leaves the bed and does cleaning, laundry etc.  She reports vividly dreaming and dreaming almost every night, sometimes she feels that she cannot possibly be asleep because her dreams are life like. She has woken herself by snoring and has been told that she snores, she has not been told that she stops breathing at night.  She experienced sleep choking when falling asleep in a seated position.  She does now nap, which she had denies in 2017- she always craves to nap !   Sleep medical history " I have had sleep problems all my life " . New diagnosis of atrial fib, Obesity is increased- over the last 1 year, BMI is now 45.97 kg/m2.    Social history: Lives alone, She may drink alcohol 1-4 times per year, she has never used tobacco products, caffeine use in form of iced tea and soda up to 4 sometimes more than 4 a day. She I has been reducing soft drinks, sweet teas.  She works from 8:30 AM to 5 PM, back in office. has no history of shift work.  Review of Systems: Out of a complete 14 system review, the patient complains of only the following symptoms, and all other reviewed systems are negative.  Sleep paralysis, sleep attacks,  Fatigue, yawning, cataplectic isolated spells. Choking in her sleep.   How likely are you to doze in the following situations: 0 = not likely, 1 = slight chance, 2 = moderate chance, 3 = high chance  Sitting and Reading? Watching Television? Sitting inactive in a public place (theater or meeting)? Lying down in the afternoon when circumstances permit? Sitting and talking to someone? Sitting quietly after lunch without alcohol? In a car, while stopped for a few minutes in traffic? As a  passenger in a car for an hour without a break?  Total = 13 , up from 10/ 24 points, while on CPAP .   FSS at 56 / 63 points on CPAP.    Pre CPAP : Epworth score 10 , Fatigue severity score 47   , depression score 4/15, achiness, numbness, tingling in legs and feet, and has still spasms worse on the left, "charley horse".   SOB with minimal exercise climbing stairs.     Social History   Socioeconomic History  Marital status: Single    Spouse name: Not on file   Number of children: Not on file   Years of education: Not on file   Highest education level: Not on file  Occupational History   Not on file  Social Needs   Financial resource strain: Not on file   Food insecurity:    Worry: Not on file    Inability: Not on file   Transportation needs:    Medical: Not on file    Non-medical: Not on file  Tobacco Use   Smoking status: Never Smoker   Smokeless tobacco: Never Used  Substance and Sexual Activity   Alcohol use: Yes    Alcohol/week: 1.0 standard drinks    Types: 1 Glasses of wine per week    Comment: rare use   Drug use: No   Sexual activity: Not on file  Lifestyle   Physical activity: NONE    Days per week: Not on file    Minutes per session: Not on file   Stress: Not on file  Relationships   Social connections:    Talks on phone:     Gets together:     Attends religious service: Goes to [employer], friends at [employer] member of club or organization:     Attends meetings of clubs or organizations:     Relationship status: Single                          Other Topics Concern   Not on file  Social History Narrative   Not on file    Family History  Problem Relation Age of Onset   Hyperlipidemia Mother    Hypertension Mother    Hyperlipidemia Father    Hypertension Father    COPD Father    Heart failure Father    Diabetes Brother    Hypertension Maternal Grandmother    Seizures Maternal Grandmother    Cancer Paternal Grandfather     Cancer Brother    Allergic rhinitis Neg Hx    Angioedema Neg Hx    Asthma Neg Hx    Eczema Neg Hx    Immunodeficiency Neg Hx    Urticaria Neg Hx     Past Medical History:  Diagnosis Date   Arthritis    Asthma    Atrial fibrillation ([place])    Dysrhythmia    A-Fib   GERD (gastroesophageal reflux disease)    Hypertension    Irritable bowel    Pneumonia    Sleep apnea    Sleep apnea    Vitamin D deficiency     Past Surgical History:  Procedure Laterality Date   COLONOSCOPY     DILITATION & CURRETTAGE/HYSTROSCOPY WITH NOVASURE ABLATION N/A 10/06/2018   Procedure: DILATATION & CURETTAGE/HYSTEROSCOPY WITH NOVASURE ABLATION;  Surgeon: Ena Dawley, MD;  Location: [facility];  Service: Gynecology;  Laterality: N/A;   ENDOMETRIAL ABLATION     EXCISION MASS UPPER EXTREMETIES Right 02/25/2016   Procedure: EXCISION RIGHT UPPER ARM MASS;  Surgeon: Coralie Keens, MD;  Location: [facility];  Service: General;  Laterality: Right;   WRIST SURGERY Left 05/08/2021    Current Outpatient Medications  Medication Sig Dispense Refill   Alcaftadine (LASTACAFT) 0.25 % SOLN Use as directed 5 mL 5   azelastine (ASTELIN) 0.1 % nasal spray Place 1 spray into both nostrils 2 (two) times daily. 30 mL 1   Cholecalciferol (VITAMIN D3) 5000 UNITS TABS  Take 1 tablet by mouth daily.     EPINEPHrine (EPIPEN 2-PAK) 0.3 mg/0.3 mL IJ SOAJ injection Use as directed for severe allergic reactions 2 each 3   Estradiol 10 MCG TABS vaginal tablet estradiol 10 mcg vaginal tablet     flecainide (TAMBOCOR) 100 MG tablet Take by mouth.     fluticasone (FLONASE) 50 MCG/ACT nasal spray SHAKE LIQUID AND USE 2 SPRAYS IN EACH NOSTRIL EVERY DAY FOR NASAL CONGESTION 16 g 5   Fluticasone-Umeclidin-Vilant (TRELEGY ELLIPTA) 200-62.5-25 MCG/INH AEPB Inhale 1 puff into the lungs daily. 28 each 5   hydrochlorothiazide (HYDRODIURIL) 12.5 MG tablet TAKE 1 TABLET(12.5 MG) BY MOUTH DAILY 90 tablet 1   levocetirizine (XYZAL) 5 MG tablet TAKE  1 TABLET BY MOUTH EVERY DAY IN THE EVENING 90 tablet 0   LINZESS 290 MCG CAPS capsule TAKE 1 CAPSULE BY MOUTH EVERY DAY 30 MINUTES BEFORE FIRST MEAL OF THE DAY ON AN EMPTY STOMACH 90 capsule 2   losartan (COZAAR) 25 MG tablet TAKE 1 TABLET(25 MG) BY MOUTH DAILY 90 tablet 1   LUMIFY 0.025 % SOLN SMARTSIG:1 Drop(s) In Eye(s) Every 12 Hours PRN     medroxyPROGESTERone (DEPO-PROVERA) 150 MG/ML injection medroxyprogesterone 150 mg/mL intramuscular suspension  Inject 1 mL every 3 months by intramuscular route around the clock.     metoprolol succinate (TOPROL-XL) 100 MG 24 hr tablet Take 100 mg by mouth daily.      montelukast (SINGULAIR) 10 MG tablet TAKE 1 TABLET(10 MG) BY MOUTH AT BEDTIME 30 tablet 0   pantoprazole (PROTONIX) 40 MG tablet TAKE 1 TABLET(40 MG) BY MOUTH DAILY 30 tablet 0   RESTASIS 0.05 % ophthalmic emulsion      rivaroxaban (XARELTO) 20 MG TABS tablet TAKE 1 TABLET(20 MG) BY MOUTH WITH SUPPER     Suvorexant (BELSOMRA) 20 MG TABS TAKE 1 TABLET BY MOUTH EVERY NIGHT AT BEDTIME AS NEEDED 30 tablet 0   Current Facility-Administered Medications  Medication Dose Route Frequency Provider Last Rate Last Admin   cyanocobalamin ((VITAMIN B-12)) injection 1,000 mcg  1,000 mcg Intramuscular Once Ghumman, Ramandeep, NP       Mepolizumab SOLR 100 mg  100 mg Subcutaneous Q28 days Garnet Sierras, DO   100 mg at 09/19/21 0940    Allergies as of 10/07/2021   (No Known Allergies)    Vitals: BP 114/76    Pulse 79    Ht 5' 7"  (1.702 m)    Wt 293 lb 8 oz (133.1 kg)    SpO2 97%    BMI 45.97 kg/m  Last Weight:  Wt Readings from Last 1 Encounters:  10/07/21 293 lb 8 oz (133.1 kg)   PJA:SNKN mass index is 45.97 kg/m.     Last Height:   Ht Readings from Last 1 Encounters:  10/07/21 5' 7"  (1.702 m)    Physical exam:  BMI is now 45.97 morbidly obese.   General: The patient is awake, alert and appears not in acute distress. The patient is well groomed. Head: Normocephalic, atraumatic. Neck is  supple. Mallampati 5 neck circumference: 14.5 . Nasal airflow patent ,  Sinusitis, TMJ click evident . Retrognathia is seen.  Cardiovascular:  Regular rate and rhythm, without murmurs or carotid bruit, and without distended neck veins. Respiratory: Lungs not wheezing to auscultation, on trilogy   Skin:  With evidence of left ankle edema,  swollen fingers. no rash .Neurologic exam : The patient is yawning non-stop, oriented to place and time.   Attention span &  concentration ability appears limited, " I am too tired to concentrate "  Speech is fluent,  without dysarthria, dysphonia or aphasia.  Mood and affect are fatigue influenced.  Cranial nerves: Pupils are equal and briskly reactive to light. Funduscopic exam without  evidence of pallor or edema. Extraocular movements  in vertical and horizontal planes intact and without nystagmus. Visual fields by finger perimetry are intact. Facial sensation intact to fine touch.  Facial motor strength is symmetric and tongue and uvula move midline. Shoulder shrug was symmetrical.   Motor exam:   Normal tone, muscle bulk and symmetric strength in all extremities.  Sensory:  Fine touch, pinprick and vibration -left ankle less sensitive to tuning fork testing . Proprioception tested in the upper extremities was normal.  Coordination: Rapid alternating movements , Finger-to-nose maneuver without evidence of ataxia, dysmetria or tremor.  Gait and station: Patient walks without assistive device. Stance is stable and normal.  Deep tendon reflexes: in the upper and lower extremities are symmetric and intact.  Babinski maneuver response is downgoing.  The patient was advised of the nature of the diagnosed sleep disorder , the treatment options and risks for general a health and wellness arising from not treating the condition.   Since she has a diagnosis of atrial fibrillation this is even more urgent.  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   Fatigue severity is now endorsed at 59 out of 63 points and her Epworth sleepiness score has risen to 13 out of 24 possible points. While on CPAP with nasal pillows mask. I spent more than 35  minutes of face to face time with the patient. Greater than 50% of time was spent in counseling and coordination of care. We have discussed the diagnosis and differential and I answered the patient's questions.     Assessment:  After physical and neurologic examination, review of laboratory studies,  Personal review of imaging studies, reports of other /same  Imaging studies ,  Results of polysomnography/ neurophysiology testing and pre-existing records as far as provided in visit., my assessment is :  1) OSA with fragmented sleep - Tracey Escobar does not have witnesses to her sleep pattern but has snored herself awake. She did not bring her CPAP machine (!) but sleep is fragmented, she wakes up frequently , choking some nights.  She sleeps in intervals of 2-3 hours. external stimulation by the TV may play a role in this.  Her sleep hygiene remains suboptimal - 5 years after we first dicussed the needs or electronics to not be in the bedroom.  Let's try audio books instead of TV.    2) atrial fibrillation on Fleccanaide.  This can be caused by apnea- other symptoms of her's are Nocturia, yawning, snoring, gasping and choking - all can be related to atrial fibrillation and fluid retention.   3) On CPAP with High Epworth score.need to see compliance.  Reports vivid dreams, irresistible urge to go to sleep.  Has experienced some cataplectic forme fruste manifestation. 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 She is so tired that she forgets more, is inattentive. HLA was negative in 2019 .  DQA1*01:02 Negative   DQB1*06:02 Negative    3) headaches in AM.   Plan:   Treatment plan and additional workup :    HST repeat on watch  pat, one night without CPAP! I need to look at compliance data of her current CPAP.   Her just 51 year old CPAP is not ready for replacement yet. 10-07-2021.   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 TSH ordered. She is on Vit D and Vit B 12 supplements.    Recommended:  Weight management referral / Dr Baird Cancer referred to nutritionist. Sleep hygiene discussion repeated.   RV in 3-4 months.    Asencion Partridge Scout Gumbs MD  10/07/2021   CC: Glendale Chard, [place],   [id number]

## 2021-10-08 NOTE — Telephone Encounter (Signed)
Pt informed of this change  and stated understanding and directions

## 2021-10-09 ENCOUNTER — Other Ambulatory Visit: Payer: Self-pay

## 2021-10-09 NOTE — Telephone Encounter (Signed)
Prior auth done for Linzess 290mg  waiting for a response from the .

## 2021-10-15 ENCOUNTER — Ambulatory Visit (INDEPENDENT_AMBULATORY_CARE_PROVIDER_SITE_OTHER): Payer: BC Managed Care – PPO

## 2021-10-15 ENCOUNTER — Other Ambulatory Visit: Payer: Self-pay

## 2021-10-15 DIAGNOSIS — J4550 Severe persistent asthma, uncomplicated: Secondary | ICD-10-CM

## 2021-10-16 ENCOUNTER — Ambulatory Visit: Payer: BC Managed Care – PPO | Admitting: Nurse Practitioner

## 2021-10-17 DIAGNOSIS — M654 Radial styloid tenosynovitis [de Quervain]: Secondary | ICD-10-CM | POA: Diagnosis not present

## 2021-10-29 ENCOUNTER — Other Ambulatory Visit: Payer: Self-pay | Admitting: Internal Medicine

## 2021-10-30 ENCOUNTER — Other Ambulatory Visit: Payer: Self-pay

## 2021-10-30 ENCOUNTER — Encounter: Payer: Self-pay | Admitting: Allergy & Immunology

## 2021-10-30 ENCOUNTER — Ambulatory Visit (INDEPENDENT_AMBULATORY_CARE_PROVIDER_SITE_OTHER): Payer: BC Managed Care – PPO | Admitting: Allergy & Immunology

## 2021-10-30 VITALS — BP 120/78 | HR 87 | Temp 98.0°F | Resp 18 | Ht 67.0 in | Wt 311.5 lb

## 2021-10-30 DIAGNOSIS — J4540 Moderate persistent asthma, uncomplicated: Secondary | ICD-10-CM

## 2021-10-30 DIAGNOSIS — K219 Gastro-esophageal reflux disease without esophagitis: Secondary | ICD-10-CM | POA: Diagnosis not present

## 2021-10-30 DIAGNOSIS — J3089 Other allergic rhinitis: Secondary | ICD-10-CM

## 2021-10-30 DIAGNOSIS — J302 Other seasonal allergic rhinitis: Secondary | ICD-10-CM

## 2021-10-30 MED ORDER — LEVOCETIRIZINE DIHYDROCHLORIDE 5 MG PO TABS: ORAL_TABLET | ORAL | 1 refills | Status: DC

## 2021-10-30 MED ORDER — AZELASTINE-FLUTICASONE 137-50 MCG/ACT NA SUSP: 2.0000 | NASAL | 5 refills | Status: DC

## 2021-10-30 MED ORDER — BEPOTASTINE BESILATE 1.5 % OP SOLN: 1.0000 [drp] | Freq: Every day | OPHTHALMIC | 5 refills | Status: DC | PRN

## 2021-10-30 NOTE — Progress Notes (Signed)
FOLLOW UP  Date of Service/Encounter:  10/30/21   Assessment:   Moderate persistent asthma, uncomplicated   Perennial allergic rhinitis (grasses, trees)  - with previous sensitization to dust mite in April 2015 as well   Gastroesophageal reflux disease  Atrial fibrillation - on Xarelto  Plan/Recommendations:    1. Moderate persistent asthma without complication - Lung testing very stable.  - We will not make any medication changes at this time.  - Daily controller medication(s): Trelegy one puff once daily + Nucala monthly - Prior to physical activity: albuterol 2 puffs 10-15 minutes before physical activity. - Rescue medications: albuterol 4 puffs every 4-6 hours as needed - Asthma control goals:  * Full participation in all desired activities (may need albuterol before activity) * Albuterol use two time or less a week on average (not counting use with activity) * Cough interfering with sleep two time or less a month * Oral steroids no more than once a year * No hospitalizations  2. Gastroesophageal reflux disease - Continue with omeprazole daily.   3. Seasonal and perennial allergic rhinitis (dust mites, grasses, trees) - We will look into starting allergy shots once there is a real late clinic in [facility].  - Continue with levocetirizine 5 mg daily. - We are going to try to change to Dymista (contains fluticasone and azelastine).  4. Return in about 6 months (around 04/29/2022).   Subjective:   Tracey Escobar is a 51 y.o. female presenting today for follow up of  Chief Complaint  Patient presents with   Asthma    Tracey Escobar has a history of the following: Patient Active Problem List   Diagnosis Date Noted   Sleep deprivation 10/07/2021   Excessive postexertional fatigue 10/07/2021   Hypersomnia, persistent 10/07/2021   OSA on CPAP 10/07/2021   Allergic conjunctivitis of both eyes 06/21/2019   OSA (obstructive sleep apnea) 10/17/2018   Morbid  obesity with body mass index (BMI) of 45.0 to 49.9 in adult [facility]) 08/09/2018   Paroxysmal atrial fibrillation (HCC) 08/09/2018   Excessive daytime sleepiness 08/09/2018   Cataplexy 08/09/2018   Abnormal dreams 08/09/2018   Nocturia more than twice per night 08/09/2018   Hypertensive heart disease without heart failure 06/17/2018   Insomnia 09/08/2017   Morbid obesity (HCC) 09/08/2017   Other long term (current) drug therapy 09/08/2017   Paresthesia of skin 09/08/2017   Snoring 09/08/2017   Allergic conjunctivitis 09/08/2017   Moderate persistent asthma 12/15/2016   Atrial fibrillation with RVR (HCC) 12/15/2016   Morbid obesity with BMI of 40.0-44.9, adult (HCC) 12/15/2016   Acute sinusitis 09/02/2016   Internal hemorrhoid 07/19/2015   Gastrointestinal hemorrhage associated with anorectal source 07/17/2015   Hypertensive disorder 07/17/2015   Lower abdominal pain 07/17/2015   Allergic rhinitis 06/11/2015   Gastroesophageal reflux disease 06/11/2015    History obtained from: chart review and patient.  Tracey Escobar is a 51 y.o. female presenting for a follow up visit.  She was last seen in March 2022.  At that time, her lung testing looks much better.  We continue with Breztri 2 puffs twice daily as well as albuterol as needed.  For her reflux, we continue with omeprazole daily.  She had testing that was positive to grasses and trees.  She desired to start allergen immunotherapy.  In the interim, she contacted [place] in May 2022.  Her Tracey Escobar was not working. She was interested in starting a biologic.  Her absolute eosinophil count was 200, so  ended up getting Nucala approved.  Since the last visit, she has been OK, althoughshe hestitates when she says this .  Asthma/Respiratory Symptom History: She does have some SOB occasionally that is inconsistent. This SOB can happen any time during the day especially when walking up a flight of stairs.  She does think that the Nucala as helped her to  breathe better. She has not needed prednisone and she has not been to the ED since starting this. She decided to stay with the Trelegy one puff once daily. She denies any thrush with this. She is good about rinsing and whatnot.   She does have a history of a fib. She sees a cardiologist 1-2 times per year. She did have echocardiogram in the past. Echocardiogram from 07/10/2019 demonstrated normal left ventricular systolic function with an estimated EF of 55-65%, mild concentric LVH and a known PFO. She sees Dr. Nicanor Bake and is on Xarelto.   Allergic Rhinitis Symptom History: Allergies are well controlled. She never did with allergy shots. She does think that they help but her symptoms worsen in the spring and the summer.  The schedule is a problem for [name]. She gets out of work at 5 pm. She is on leovcetirizine 5mg  daily. She uses Flonase in the morning once daily and azleastine at night once daily.   She works at a . Unfortunately, there are no nursing staff there, so getting allergy shots at work is not an option. She does not get off work until 5:30pm, but if the [facility] office were actually open for a true late clinic, she would be able to make it.   Otherwise, there have been no changes to her past medical history, surgical history, family history, or social history.    Review of Systems  Constitutional: Negative.  Negative for fever, malaise/fatigue and weight loss.  HENT: Negative.  Negative for congestion, ear discharge and ear pain.   Eyes:  Negative for pain, discharge and redness.  Respiratory:  Positive for cough. Negative for sputum production, shortness of breath and wheezing.   Cardiovascular: Negative.  Negative for chest pain and palpitations.  Gastrointestinal:  Negative for abdominal pain, heartburn, nausea and vomiting.  Skin: Negative.  Negative for itching and rash.  Neurological:  Negative for dizziness and headaches.  Endo/Heme/Allergies:  Negative  for environmental allergies. Does not bruise/bleed easily.      Objective:   Blood pressure 120/78, pulse 87, temperature 98 F (36.7 C), temperature source Temporal, resp. rate 18, height 5\' 7"  (1.702 m), weight (!) 311 lb 8.2 oz (141.3 kg), SpO2 97 %. Body mass index is 48.79 kg/m.   Physical Exam:  Physical Exam Vitals reviewed.  Constitutional:      Appearance: She is well-developed.     Comments: Talkative. Friendly.   HENT:     Head: Normocephalic and atraumatic.     Right Ear: Tympanic membrane, ear canal and external ear normal.     Left Ear: Tympanic membrane, ear canal and external ear normal.     Nose: No nasal deformity, septal deviation, mucosal edema or rhinorrhea.     Right Turbinates: Enlarged, swollen and pale.     Left Turbinates: Enlarged, swollen and pale.     Right Sinus: No maxillary sinus tenderness or frontal sinus tenderness.     Left Sinus: No maxillary sinus tenderness or frontal sinus tenderness.     Mouth/Throat:     Mouth: Mucous membranes are not pale and not  dry.     Pharynx: Uvula midline.  Eyes:     General: Lids are normal. No allergic shiner.       Right eye: No discharge.        Left eye: No discharge.     Conjunctiva/sclera: Conjunctivae normal.     Right eye: Right conjunctiva is not injected. No chemosis.    Left eye: Left conjunctiva is not injected. No chemosis.    Pupils: Pupils are equal, round, and reactive to light.  Cardiovascular:     Rate and Rhythm: Normal rate and regular rhythm.     Heart sounds: Normal heart sounds.  Pulmonary:     Effort: Pulmonary effort is normal. No tachypnea, accessory muscle usage or respiratory distress.     Breath sounds: Normal breath sounds. No wheezing, rhonchi or rales.     Comments: Moving air well in all lung fields. No increased work of breathing noted.  Chest:     Chest wall: No tenderness.  Lymphadenopathy:     Cervical: No cervical adenopathy.  Skin:    Coloration: Skin is not  pale.     Findings: No abrasion, erythema, petechiae or rash. Rash is not papular, urticarial or vesicular.  Neurological:     Mental Status: She is alert.  Psychiatric:        Behavior: Behavior is cooperative.     Diagnostic studies:    Spirometry: results abnormal (FEV1: 1.56/60%, FVC: 1.92/59%, FEV1/FVC: 81%).    Spirometry consistent with possible restrictive disease.    Allergy Studies: none        Malachi BondsJoel Lilliane Sposito, MD  [facility]

## 2021-10-30 NOTE — Patient Instructions (Addendum)
1. Moderate persistent asthma without complication - Lung testing very stable.  - We will not make any medication changes at this time.  - Daily controller medication(s): Trelegy one puff once daily + Nucala monthly - Prior to physical activity: albuterol 2 puffs 10-15 minutes before physical activity. - Rescue medications: albuterol 4 puffs every 4-6 hours as needed - Asthma control goals:  * Full participation in all desired activities (may need albuterol before activity) * Albuterol use two time or less a week on average (not counting use with activity) * Cough interfering with sleep two time or less a month * Oral steroids no more than once a year * No hospitalizations  2. Gastroesophageal reflux disease - Continue with omeprazole daily.   3. Seasonal and perennial allergic rhinitis (dust mites, grasses, trees) - We will look into starting allergy shots once there is a real late clinic in [facility].  - Continue with levocetirizine 5 mg daily. - We are going to try to change to Dymista (contains fluticasone and azelastine).  4. Return in about 6 months (around 04/29/2022).    Please inform us of any Emergency Department visits, hospitalizations, or changes in symptoms. Call us before going to the ED for breathing or allergy symptoms since we might be able to fit you in for a sick visit. Feel free to contact us anytime with any questions, problems, or concerns.  It was a pleasure to see you again today!  Websites that have reliable patient information: 1. American Academy of Asthma, Allergy, and Immunology: www.aaaai.org 2. Food Allergy Research and Education (FARE): foodallergy.org 3. Mothers of Asthmatics: http://www.asthmacommunitynetwork.org 4. American College of Allergy, Asthma, and Immunology: www.acaai.org   COVID-19 Vaccine Information can be found at: [email or web address] For questions related to vaccine  distribution or appointments, please email [email or web address] or call [phone].   We realize that you might be concerned about having an allergic reaction to the COVID19 vaccines. To help with that concern, WE ARE OFFERING THE COVID19 VACCINES IN OUR OFFICE! Ask the front desk for dates!     Like [place] on [employer] and Instagram for our latest updates!      A healthy democracy works best when [employer] participate! Make sure you are registered to vote! If you have moved or changed any of your contact information, you will need to get this updated before voting!  In some cases, you MAY be able to register to vote online: [email or web address]

## 2021-10-31 ENCOUNTER — Other Ambulatory Visit: Payer: Self-pay

## 2021-10-31 ENCOUNTER — Telehealth: Payer: Self-pay | Admitting: *Deleted

## 2021-10-31 MED ORDER — MONTELUKAST SODIUM 10 MG PO TABS: 10.0000 mg | ORAL_TABLET | Freq: Every day | ORAL | 0 refills | Status: DC

## 2021-10-31 NOTE — Telephone Encounter (Signed)
PA has been submitted through CoverMyMeds for Azelastine-Fluticasone and is currently pending approval/denial.  ?

## 2021-11-02 ENCOUNTER — Encounter: Payer: Self-pay | Admitting: Allergy & Immunology

## 2021-11-03 ENCOUNTER — Encounter: Payer: Self-pay | Admitting: Internal Medicine

## 2021-11-03 NOTE — Telephone Encounter (Signed)
PA has been resubmitted through CoverMyMeds and is currently pending approval/denial.  

## 2021-11-04 ENCOUNTER — Other Ambulatory Visit: Payer: Self-pay

## 2021-11-04 MED ORDER — TRULANCE 3 MG PO TABS: 3.0000 mg | ORAL_TABLET | Freq: Every day | ORAL | 3 refills | Status: DC

## 2021-11-05 ENCOUNTER — Ambulatory Visit (INDEPENDENT_AMBULATORY_CARE_PROVIDER_SITE_OTHER): Payer: BC Managed Care – PPO | Admitting: Neurology

## 2021-11-05 DIAGNOSIS — R5383 Other fatigue: Secondary | ICD-10-CM

## 2021-11-05 DIAGNOSIS — G4733 Obstructive sleep apnea (adult) (pediatric): Secondary | ICD-10-CM | POA: Diagnosis not present

## 2021-11-05 DIAGNOSIS — J4540 Moderate persistent asthma, uncomplicated: Secondary | ICD-10-CM

## 2021-11-05 DIAGNOSIS — I4891 Unspecified atrial fibrillation: Secondary | ICD-10-CM

## 2021-11-05 DIAGNOSIS — G4710 Hypersomnia, unspecified: Secondary | ICD-10-CM

## 2021-11-06 NOTE — Telephone Encounter (Signed)
Please disregard previous message, PA for Azelastine-Fluticasone has been approved. PA has been faxed to patients pharmacy, labeled, and placed in bulk scanning.

## 2021-11-06 NOTE — Telephone Encounter (Signed)
Unfortunately her insurance is not wanting to cover azelastine-fluticasone. Are you ok with splitting the medication into two?

## 2021-11-07 NOTE — Telephone Encounter (Signed)
Noted - thank you!   Odel Schmid, MD [facility]  

## 2021-11-10 NOTE — Progress Notes (Signed)
[facility]   HOME SLEEP TEST REPORT ( by Watch PAT)   STUDY DATE:  loaded 11-10-2021   ORDERING CLINICIAN: Porfirio Mylar Dohmeier,MD  REFERRING CLINICIAN: Dr Allyne Gee.   CLINICAL INFORMATION/HISTORY: 10-07-2021: patient with CPAP non-compliance ( may be pandemic  related communication break-down) , now with atrial fibrillation.Chief complaint according to patient : "I cannot get enough sleep" and  " I am just very tired all the time".  "I have a CPAP but I didn't bring it and I wasn't sent any supplies for 12 months".      HPI:  Tracey Escobar is a 51 y.o. female patient , seen here as a CONSULT on 08-09-2018 from Dr. Allyne Gee ( after a 3 year hiatus) for a new sleep consult.  The sleep test is now 51 years old and I like to repeat it, she reports sleepiness on CPAP, worsening fatigue, need for naps-  has fallen asleep at her desk. She is not working from home any longer, felt distracted at home.    Epworth sleepiness score: 13/24.   BMI: 46 kg/m   Neck Circumference: 15"   FINDINGS:   Sleep Summary:   Total Recording Time (hours, min):    Total recording time amounted to 7 hours 47 minutes of which 6 hours 18 minutes with a calculated sleep time.  29.5% of sleep were REM sleep.                                     Respiratory Indices:   Calculated pAHI (per hour): 15.1/h                           REM pAHI:   31.2/h                                              NREM pAHI: 8.3/h  Positional AHI: In supine sleep the AHI during the sleep study was 12.7/h in prone sleep 22.7/h and on her left side 18.4/h.  Overall nonsupine AHI was higher than supine AHI.  Snoring data show a mean volume of 41 dB accompanying 25% of total sleep time.                                                 Oxygen Saturation Statistics:      O2 Saturation Range (%): Oxygen saturation varied between 89 at nadir and a maximum of 100% saturation with a mean value of 95%.                                      O2 Saturation (minutes) <89%: 0 minutes          Pulse Rate Statistics:   Pulse Mean (bpm): 84 bpm               Pulse Range:   Between 66 and 105 bpm.              IMPRESSION:  This HST confirms the  presence of overall mild to moderate sleep apnea obstructive sleep apnea.  There is clear REM sleep dependence present but as discussed above a positional dependence was not seen.  There is also no associated hypoxia.  I would recommend to treat this degree of apnea because of the history of cardiac palpitations that was stated.  An auto titration CPAP would still be indicated; If the patient still has her CPAP machine we will set her up for a window between 6 and 18 cm water pressure with 3 cm EPR, she will be set up with a mask of her choice which does not have to be a full facemask.  Heated humidification should be provided and I prefer a ResMed device.  If the patient is unable to tolerate CPAP ,we have very few options in her case.  Her BMI is too high to qualify for inspire which has a cutoff of 32.   Also REM dependent apnea does not respond to inspire or dental device treatment.   In that case of not wanting CPAP ,my recommendation would be aggressive weight loss therapy and respiratory therapy with deep breathing exercises.   RECOMMENDATION:    INTERPRETING PHYSICIAN:   Melvyn Novas, MD   Medical Director of [facility] at [facility].

## 2021-11-14 ENCOUNTER — Ambulatory Visit (INDEPENDENT_AMBULATORY_CARE_PROVIDER_SITE_OTHER): Payer: BC Managed Care – PPO | Admitting: *Deleted

## 2021-11-14 ENCOUNTER — Other Ambulatory Visit: Payer: Self-pay

## 2021-11-14 DIAGNOSIS — J4550 Severe persistent asthma, uncomplicated: Secondary | ICD-10-CM

## 2021-11-17 ENCOUNTER — Encounter: Payer: Self-pay | Admitting: Internal Medicine

## 2021-11-17 ENCOUNTER — Ambulatory Visit: Payer: BC Managed Care – PPO | Admitting: Internal Medicine

## 2021-11-17 ENCOUNTER — Other Ambulatory Visit (HOSPITAL_COMMUNITY): Payer: Self-pay

## 2021-11-17 ENCOUNTER — Other Ambulatory Visit: Payer: Self-pay

## 2021-11-17 VITALS — BP 120/76 | HR 66 | Temp 98.3°F | Ht 67.0 in | Wt 308.6 lb

## 2021-11-17 DIAGNOSIS — K581 Irritable bowel syndrome with constipation: Secondary | ICD-10-CM | POA: Diagnosis not present

## 2021-11-17 DIAGNOSIS — K219 Gastro-esophageal reflux disease without esophagitis: Secondary | ICD-10-CM | POA: Diagnosis not present

## 2021-11-17 DIAGNOSIS — N951 Menopausal and female climacteric states: Secondary | ICD-10-CM

## 2021-11-17 DIAGNOSIS — M25551 Pain in right hip: Secondary | ICD-10-CM | POA: Insufficient documentation

## 2021-11-17 DIAGNOSIS — F5101 Primary insomnia: Secondary | ICD-10-CM

## 2021-11-17 DIAGNOSIS — M2550 Pain in unspecified joint: Secondary | ICD-10-CM

## 2021-11-17 DIAGNOSIS — Z6841 Body Mass Index (BMI) 40.0 and over, adult: Secondary | ICD-10-CM

## 2021-11-17 MED ORDER — SEMAGLUTIDE-WEIGHT MANAGEMENT 0.5 MG/0.5ML ~~LOC~~ SOAJ
0.5000 mg | SUBCUTANEOUS | 0 refills | Status: DC
  Filled 2021-11-17: qty 2, 28d supply, fill #0

## 2021-11-17 NOTE — Patient Instructions (Signed)
Diet for Irritable Bowel Syndrome °When you have irritable bowel syndrome (IBS), it is very important to eat the foods and follow the eating habits that are best for your condition. IBS may cause various symptoms such as pain in the abdomen, constipation, or diarrhea. Choosing the right foods can help to ease the discomfort from these symptoms. Work with your health care provider and diet and nutrition specialist (dietitian) to find the eating plan that will help to control your symptoms. °What are tips for following this plan? °  °Keep a food diary. This will help you identify foods that cause symptoms. Write down: °What you eat and when you eat it. °What symptoms you have. °When symptoms occur in relation to your meals, such as "pain in abdomen 2 hours after dinner." °Eat your meals slowly and in a relaxed setting. °Aim to eat 5-6 small meals per day. Do not skip meals. °Drink enough fluid to keep your urine pale yellow. °Ask your health care provider if you should take an over-the-counter probiotic to help restore healthy bacteria in your gut (digestive tract). °Probiotics are foods that contain good bacteria and yeasts. °Your dietitian may have specific dietary recommendations for you based on your symptoms. He or she may recommend that you: °Avoid foods that cause symptoms. Talk with your dietitian about other ways to get the same nutrients that are in those problem foods. °Avoid foods with gluten. Gluten is a protein that is found in rye, wheat, and barley. °Eat more foods that contain soluble fiber. Examples of foods with high soluble fiber include oats, seeds, and certain fruits and vegetables. Take a fiber supplement if directed by your dietitian. °Reduce or avoid certain foods called FODMAPs. These are foods that contain carbohydrates that are hard to digest. Ask your doctor which foods contain these carbohydrates. °What foods are not recommended? °The following are some foods and drinks that may make your  symptoms worse: °Fatty foods, such as french fries. °Foods that contain gluten, such as pasta and cereal. °Dairy products, such as milk, cheese, and ice cream. °Chocolate. °Alcohol. °Products with caffeine, such as coffee. °Carbonated drinks, such as soda. °Foods that are high in FODMAPs. These include certain fruits and vegetables. °Products with sweeteners such as honey, high fructose corn syrup, sorbitol, and mannitol. °The items listed above may not be a complete list of foods and beverages you should avoid. Contact a dietitian for more information. °What foods are good sources of fiber? °Your health care provider or dietitian may recommend that you eat more foods that contain fiber. Fiber can help to reduce constipation and other IBS symptoms. Add foods with fiber to your diet a little at a time so your body can get used to them. Too much fiber at one time might cause gas and swelling of your abdomen. The following are some foods that are good sources of fiber: °Berries, such as raspberries, strawberries, and blueberries. °Tomatoes. °Carrots. °Brown rice. °Oats. °Seeds, such as chia and pumpkin seeds. °The items listed above may not be a complete list of recommended sources of fiber. Contact your dietitian for more options. °Where to find more information °International Foundation for Functional Gastrointestinal Disorders: www.iffgd.org °National Institute of Diabetes and Digestive and Kidney Diseases: www.niddk.nih.gov °Summary °When you have irritable bowel syndrome (IBS), it is very important to eat the foods and follow the eating habits that are best for your condition. °IBS may cause various symptoms such as pain in the abdomen, constipation, or diarrhea. °Choosing the right   foods can help to ease the discomfort that comes from symptoms. °Keep a food diary. This will help you identify foods that cause symptoms. °Your health care provider or diet and nutrition specialist (dietitian) may recommend that you  eat more foods that contain fiber. °This information is not intended to replace advice given to you by your health care provider. Make sure you discuss any questions you have with your health care provider. °Document Revised: 05/16/2020 Document Reviewed: 05/23/2020 °Elsevier Patient Education © 2022 Elsevier Inc. ° °

## 2021-11-17 NOTE — Progress Notes (Addendum)
I,Katawbba Wiggins,acting as a Education administrator for Maximino Greenland, MD.,have documented all relevant documentation on the behalf of Maximino Greenland, MD,as directed by  Maximino Greenland, MD while in the presence of Maximino Greenland, MD.  This visit occurred during the SARS-CoV-2 public health emergency.  Safety protocols were in place, including screening questions prior to the visit, additional usage of staff PPE, and extensive cleaning of exam room while observing appropriate contact time as indicated for disinfecting solutions.  Subjective:     Patient ID: Tracey Escobar , female    DOB: 10/29/1970 , 51 y.o.   MRN: [id number]   Chief Complaint  Patient presents with   Obesity    HPI  The patient is here today for a follow-up on her weight and Irritable bowel syndrome.  She is interested in starting [facility] for weight loss. She denies having any family/personal history of thyroid cancer.   Additionally, she wanted to get back on Linzess for IBS; however, her insurance will only pay for Trulance.     Past Medical History:  Diagnosis Date   Arthritis    Asthma    Atrial fibrillation (HCC)    Dysrhythmia    A-Fib   GERD (gastroesophageal reflux disease)    Hypertension    Irritable bowel    Pneumonia    Sleep apnea    Sleep apnea    Vitamin D deficiency      Family History  Problem Relation Age of Onset   Hyperlipidemia Mother    Hypertension Mother    Hyperlipidemia Father    Hypertension Father    COPD Father    Heart failure Father    Diabetes Brother    Hypertension Maternal Grandmother    Seizures Maternal Grandmother    Cancer Paternal Grandfather    Cancer Brother    Allergic rhinitis Neg Hx    Angioedema Neg Hx    Asthma Neg Hx    Eczema Neg Hx    Immunodeficiency Neg Hx    Urticaria Neg Hx      Current Outpatient Medications:    Alcaftadine (LASTACAFT) 0.25 % SOLN, Use as directed, Disp: 5 mL, Rfl: 5   Armodafinil 150 MG tablet, Take 1 tablet (150 mg total) by  mouth daily., Disp: 30 tablet, Rfl: 5   Azelastine-Fluticasone (DYMISTA) 137-50 MCG/ACT SUSP, Place 2 sprays into both nostrils 1 day or 1 dose., Disp: 23 g, Rfl: 5   Bepotastine Besilate 1.5 % SOLN, Place 1 drop into both eyes daily as needed., Disp: 10 mL, Rfl: 5   Cholecalciferol (VITAMIN D3) 5000 UNITS TABS, Take 1 tablet by mouth daily., Disp: , Rfl:    EPINEPHrine (EPIPEN 2-PAK) 0.3 mg/0.3 mL IJ SOAJ injection, Use as directed for severe allergic reactions, Disp: 2 each, Rfl: 3   Estradiol 10 MCG TABS vaginal tablet, estradiol 10 mcg vaginal tablet, Disp: , Rfl:    flecainide (TAMBOCOR) 100 MG tablet, Take by mouth., Disp: , Rfl:    Fluticasone-Umeclidin-Vilant (TRELEGY ELLIPTA) 200-62.5-25 MCG/INH AEPB, Inhale 1 puff into the lungs daily., Disp: 28 each, Rfl: 5   hydrochlorothiazide (HYDRODIURIL) 12.5 MG tablet, TAKE 1 TABLET(12.5 MG) BY MOUTH DAILY, Disp: 90 tablet, Rfl: 1   levocetirizine (XYZAL) 5 MG tablet, TAKE 1 TABLET BY MOUTH EVERY DAY IN THE EVENING, Disp: 90 tablet, Rfl: 1   losartan (COZAAR) 25 MG tablet, TAKE 1 TABLET(25 MG) BY MOUTH DAILY, Disp: 90 tablet, Rfl: 1   LUMIFY 0.025 % SOLN, SMARTSIG:1 Drop(s)  In Eye(s) Every 12 Hours PRN, Disp: , Rfl:    medroxyPROGESTERone (DEPO-PROVERA) 150 MG/ML injection, medroxyprogesterone 150 mg/mL intramuscular suspension  Inject 1 mL every 3 months by intramuscular route around the clock., Disp: , Rfl:    metoprolol succinate (TOPROL-XL) 100 MG 24 hr tablet, Take 100 mg by mouth daily. , Disp: , Rfl:    montelukast (SINGULAIR) 10 MG tablet, Take 1 tablet (10 mg total) by mouth at bedtime., Disp: 30 tablet, Rfl: 0   NUCALA 100 MG/ML SOAJ, Inject into the skin., Disp: , Rfl:    pantoprazole (PROTONIX) 40 MG tablet, TAKE 1 TABLET(40 MG) BY MOUTH DAILY, Disp: 30 tablet, Rfl: 0   Plecanatide (TRULANCE) 3 MG TABS, Take 3 mg by mouth daily., Disp: 30 tablet, Rfl: 3   potassium chloride (KLOR-CON) 10 MEQ tablet, potassium chloride ER 10 mEq  tablet,extended release  TAKE ONE TABLET BY MOUTH DAILY FOR 14 DAYS, Disp: , Rfl:    RESTASIS 0.05 % ophthalmic emulsion, , Disp: , Rfl:    rivaroxaban (XARELTO) 20 MG TABS tablet, TAKE 1 TABLET(20 MG) BY MOUTH WITH SUPPER, Disp: , Rfl:    [START ON 12/16/2021] Semaglutide-Weight Management 0.5 MG/0.5ML SOAJ, Inject 0.5 mg into the skin once a week, Disp: 2 mL, Rfl: 0   Suvorexant (BELSOMRA) 20 MG TABS, TAKE 1 TABLET BY MOUTH EVERY NIGHT AT BEDTIME AS NEEDED, Disp: 30 tablet, Rfl: 0   LINZESS 290 MCG CAPS capsule, TAKE 1 CAPSULE BY MOUTH EVERY DAY 30 MINUTES BEFORE FIRST MEAL OF THE DAY ON AN EMPTY STOMACH (Patient not taking: Reported on 11/17/2021), Disp: 90 capsule, Rfl: 2  Current Facility-Administered Medications:    cyanocobalamin ((VITAMIN B-12)) injection 1,000 mcg, 1,000 mcg, Intramuscular, Once, Ghumman, Ramandeep, NP   Mepolizumab SOLR 100 mg, 100 mg, Subcutaneous, Q28 days, Rexene Alberts M, DO, 100 mg at 11/14/21 0900   No Known Allergies   Review of Systems  Constitutional: Negative.   Respiratory: Negative.    Cardiovascular: Negative.   Gastrointestinal:  Positive for constipation.  Musculoskeletal:  Positive for arthralgias.  Neurological: Negative.   Psychiatric/Behavioral: Negative.      Today's Vitals   11/17/21 1626  BP: 120/76  Pulse: 66  Temp: 98.3 F (36.8 C)  Weight: (!) 308 lb 9.6 oz (140 kg)  Height: 5\' 7"  (1.702 m)   Body mass index is 48.33 kg/m.  Wt Readings from Last 3 Encounters:  11/17/21 (!) 308 lb 9.6 oz (140 kg)  10/30/21 (!) 311 lb 8.2 oz (141.3 kg)  10/07/21 293 lb 8 oz (133.1 kg)    BP Readings from Last 3 Encounters:  11/17/21 120/76  10/30/21 120/78  10/07/21 114/76    Objective:  Physical Exam Vitals and nursing note reviewed.  Constitutional:      Appearance: Normal appearance. She is obese.  HENT:     Head: Normocephalic and atraumatic.     Nose:     Comments: Masked     Mouth/Throat:     Comments: Masked  Eyes:      Extraocular Movements: Extraocular movements intact.  Cardiovascular:     Rate and Rhythm: Normal rate and regular rhythm.     Heart sounds: Normal heart sounds.  Pulmonary:     Effort: Pulmonary effort is normal.     Breath sounds: Normal breath sounds.  Musculoskeletal:     Cervical back: Normal range of motion.  Skin:    General: Skin is warm.  Neurological:     General: No focal  deficit present.     Mental Status: She is alert.  Psychiatric:        Mood and Affect: Mood normal.        Behavior: Behavior normal.        Assessment And Plan:     1. Class 3 severe obesity due to excess calories with serious comorbidity and body mass index (BMI) of 45.0 to 49.9 in adult [facility]) Comments: She is encouraged to strive for BMI less than 30 to decrease cardiac risk. Advised to aim for at least 150 minutes of exercise per week.  We discussed the use of Wegovy for obesity. She denies family/personal h/o thyroid cancer. She was given 0.25mg  samples and advised how to administer the medication. She understands that she will not take her next dose until next week. She is reminded to stop eating when full. She will rto in 4 weeks for re-evaluation.  Possible side effects d/w patient.  - Insulin, random(561)  2. Irritable bowel syndrome with constipation Comments: She is now on Trulance with improvement in her sx. She may also benefit from fiber supplementation.   3. Gastroesophageal reflux disease without esophagitis Comments: She will c/w pantoprazole once daily. Advised to stop eating 3 hours prior to going to bed.   4. Arthralgia, unspecified joint Comments: She c/o generalized arthralgias.  Advised to cut out sugar and start drinking dandelion root tea. I will check arthritis panel and vitamin D level today. - Vitamin D (25 hydroxy) - ANA, IFA (with reflex) - CYCLIC CITRUL PEPTIDE ANTIBODY, IGG/IGA - Rheumatoid factor - Sedimentation rate - Uric acid  5. Perimenopause Comments:  Encouraged to follow clean eating diet. Also with insomnia. May benefit from Remifemin daily. Advised to start dandelion root tea nightly.   6. Primary insomnia Comments: She was given samples of Dayvigo to take nightly prn. Advised to develop good bedtime hygiene.     Patient was given opportunity to ask questions. Patient verbalized understanding of the plan and was able to repeat key elements of the plan. All questions were answered to their satisfaction.   I, Maximino Greenland, MD, have reviewed all documentation for this visit. The documentation on 11/17/21 for the exam, diagnosis, procedures, and orders are all accurate and complete.   IF YOU HAVE BEEN REFERRED TO A SPECIALIST, IT MAY TAKE 1-2 WEEKS TO SCHEDULE/PROCESS THE REFERRAL. IF YOU HAVE NOT HEARD FROM US/SPECIALIST IN TWO WEEKS, PLEASE GIVE [place] A CALL AT [phone].   THE PATIENT IS ENCOURAGED TO PRACTICE SOCIAL DISTANCING DUE TO THE COVID-19 PANDEMIC.

## 2021-11-18 ENCOUNTER — Ambulatory Visit: Payer: BC Managed Care – PPO | Admitting: Internal Medicine

## 2021-11-21 ENCOUNTER — Other Ambulatory Visit: Payer: Self-pay | Admitting: Internal Medicine

## 2021-11-24 ENCOUNTER — Telehealth: Payer: Self-pay | Admitting: Neurology

## 2021-11-24 NOTE — Telephone Encounter (Signed)
Pt would like a call from the nurse to discuss having labwork done tomorrow at Dr. Dorothyann Peng' office. She would like a to know if can run thyroid panel at Dr. Zella Ball office in morning?

## 2021-11-24 NOTE — Telephone Encounter (Signed)
Called the patient back. There was no answer. Left a detailed message advising the pt that it would be perfectly fine if she wanted to have the thyroid level checked through her office. Advised that typically If the lab was to come back abnormal we would advise following up with primary care. Advised if I was unsure if they are able to draw the labs off of Dr Dohmeier's order of if the primary care has to re enter the order in but instructed she could discuss this further with Dr Baird Cancer about completing it at their office. We are fine with it from our standpoint. I will see about releasing the orders in hopes that they wont have to be re entered in all together. Instructed the pt to call back with any questions.

## 2021-11-25 ENCOUNTER — Other Ambulatory Visit: Payer: Self-pay

## 2021-11-25 ENCOUNTER — Other Ambulatory Visit: Payer: BC Managed Care – PPO

## 2021-11-25 DIAGNOSIS — J4540 Moderate persistent asthma, uncomplicated: Secondary | ICD-10-CM

## 2021-11-25 DIAGNOSIS — Z3040 Encounter for surveillance of contraceptives, unspecified: Secondary | ICD-10-CM | POA: Diagnosis not present

## 2021-11-25 DIAGNOSIS — Z1211 Encounter for screening for malignant neoplasm of colon: Secondary | ICD-10-CM | POA: Diagnosis not present

## 2021-11-25 DIAGNOSIS — I4891 Unspecified atrial fibrillation: Secondary | ICD-10-CM

## 2021-11-25 DIAGNOSIS — M2550 Pain in unspecified joint: Secondary | ICD-10-CM | POA: Diagnosis not present

## 2021-11-25 DIAGNOSIS — N898 Other specified noninflammatory disorders of vagina: Secondary | ICD-10-CM | POA: Diagnosis not present

## 2021-11-25 DIAGNOSIS — Z6841 Body Mass Index (BMI) 40.0 and over, adult: Secondary | ICD-10-CM | POA: Diagnosis not present

## 2021-11-25 DIAGNOSIS — Z1231 Encounter for screening mammogram for malignant neoplasm of breast: Secondary | ICD-10-CM | POA: Diagnosis not present

## 2021-11-25 DIAGNOSIS — G4733 Obstructive sleep apnea (adult) (pediatric): Secondary | ICD-10-CM

## 2021-11-25 DIAGNOSIS — Z01419 Encounter for gynecological examination (general) (routine) without abnormal findings: Secondary | ICD-10-CM | POA: Diagnosis not present

## 2021-11-26 LAB — THYROID PANEL WITH TSH
Free Thyroxine Index: 1.9 (ref 1.2–4.9)
T3 Uptake Ratio: 22 % — ABNORMAL LOW (ref 24–39)
T4, Total: 8.8 ug/dL (ref 4.5–12.0)
TSH: 2.11 u[IU]/mL (ref 0.450–4.500)

## 2021-11-27 ENCOUNTER — Telehealth: Payer: Self-pay | Admitting: *Deleted

## 2021-11-27 NOTE — Progress Notes (Signed)
Normal T4 , Free thyroxine and TSH , and very slightly reduced T 3 uptake. Normal panel .

## 2021-11-27 NOTE — Telephone Encounter (Signed)
Tried calling [email or web address]. Someone picked up both times but just heard voices in background, pt did not reply.  Sent [employer].

## 2021-11-27 NOTE — Telephone Encounter (Signed)
-----   Message from Melvyn Novas, MD sent at 11/27/2021 12:00 PM EST ----- Normal T4 , Free thyroxine and TSH , and very slightly reduced T 3 uptake. Normal panel .

## 2021-11-28 NOTE — Procedures (Signed)
° ° ° °  °  °[facility] °  °HOME SLEEP TEST REPORT ( by Watch PAT)   °STUDY DATE:  loaded 11-10-2021 °  °ORDERING CLINICIAN: Xaviera Flaten,MD ° °REFERRING CLINICIAN: Dr Sanders. °  °CLINICAL INFORMATION/HISTORY: 10-07-2021: patient with CPAP non-compliance ( may be pandemic  related communication break-down) , now with atrial fibrillation.Chief complaint according to patient : "I cannot get enough sleep" and  " I am just very tired all the time".  °"I have a CPAP but I didn't bring it and I wasn't sent any supplies for 12 months".  °  °  °HPI:  Tracey Escobar is a 51 y.o. female patient , seen here as a CONSULT on 08-09-2018 from Dr. Sanders ( after a 3 year hiatus) for a new sleep consult.  °The sleep test is now 51 years old and I like to repeat it, she reports sleepiness on CPAP, worsening fatigue, need for naps-  has fallen asleep at her desk. She is not working from home any longer, felt distracted at home.  °  °Epworth sleepiness score: 13/24. °  °BMI: 46 kg/m² °  °Neck Circumference: 15" °  °FINDINGS: °  °Sleep Summary: °  °Total Recording Time (hours, min):    Total recording time amounted to 7 hours 47 minutes of which 6 hours 18 minutes with a calculated sleep time.  29.5% of sleep were REM sleep. °  °                                °  °Respiratory Indices: °  °Calculated pAHI (per hour): 15.1/h                         °  °REM pAHI:   31.2/h                                            °  °NREM pAHI: 8.3/h ° °Positional AHI: In supine sleep the AHI during the sleep study was 12.7/h in prone sleep 22.7/h and on her left side 18.4/h.  Overall nonsupine AHI was higher than supine AHI. ° °Snoring data show a mean volume of 41 dB accompanying 25% of total sleep time.                                               °  °Oxygen Saturation Statistics:    °  °O2 Saturation Range (%): Oxygen saturation varied between 89 at nadir and a maximum of 100% saturation with a mean value of 95%.                                    °  °O2 Saturation (minutes) <89%: 0 minutes        °  °Pulse Rate Statistics: °  °Pulse Mean (bpm): 84 bpm             °  °Pulse Range:   Between 66 and 105 bpm.            °  °IMPRESSION:  This HST confirms the   presence of overall mild to moderate sleep apnea obstructive sleep apnea.  There is clear REM sleep dependence present but as discussed above a positional dependence was not seen.  There is also no associated hypoxia.  I would recommend to treat this degree of apnea because of the history of cardiac palpitations that was stated.  An auto titration CPAP would still be indicated; °If the patient still has her CPAP machine we will set her up for a window between 6 and 18 cm water pressure with 3 cm EPR, she will be set up with a mask of her choice which does not have to be a full facemask.  Heated humidification should be provided and I prefer a ResMed device. ° °If the patient is unable to tolerate CPAP ,we have very few options in her case.  Her BMI is too high to qualify for inspire which has a cutoff of 32.   °Also REM dependent apnea does not respond to inspire or dental device treatment.   °In that case of not wanting CPAP ,my recommendation would be aggressive weight loss therapy and respiratory therapy with deep breathing exercises. °  °RECOMMENDATION: ° °  °INTERPRETING PHYSICIAN: ° ° Reynol Arnone, MD  ° °Medical Director of [facility].  ° ° ° ° ° ° ° ° ° ° ° ° ° ° ° ° ° ° ° °

## 2021-11-28 NOTE — Addendum Note (Signed)
Addended by: Melvyn Novas on: 11/28/2021 01:16 PM   Modules accepted: Orders

## 2021-11-29 LAB — RHEUMATOID FACTOR: Rheumatoid fact SerPl-aCnc: 10 IU/mL (ref <14.0)

## 2021-11-29 LAB — INSULIN, RANDOM: INSULIN: 22.7 u[IU]/mL (ref 2.6–24.9)

## 2021-11-29 LAB — ANTINUCLEAR ANTIBODIES, IFA: ANA Titer 1: NEGATIVE

## 2021-11-29 LAB — CYCLIC CITRUL PEPTIDE ANTIBODY, IGG/IGA: Cyclic Citrullin Peptide Ab: 2 units (ref 0–19)

## 2021-11-29 LAB — VITAMIN D 25 HYDROXY (VIT D DEFICIENCY, FRACTURES): Vit D, 25-Hydroxy: 64.1 ng/mL (ref 30.0–100.0)

## 2021-11-29 LAB — URIC ACID: Uric Acid: 6.7 mg/dL (ref 3.0–7.2)

## 2021-11-29 LAB — SEDIMENTATION RATE: Sed Rate: 28 mm/hr (ref 0–40)

## 2021-12-01 ENCOUNTER — Encounter: Payer: Self-pay | Admitting: Neurology

## 2021-12-01 ENCOUNTER — Telehealth: Payer: Self-pay | Admitting: Neurology

## 2021-12-01 DIAGNOSIS — Z309 Encounter for contraceptive management, unspecified: Secondary | ICD-10-CM | POA: Diagnosis not present

## 2021-12-01 NOTE — Telephone Encounter (Signed)
-----   Message from Melvyn Novas, MD sent at 11/28/2021  1:16 PM EST ----- This HST confirms the presence of overall mild to moderate sleep apnea obstructive sleep apnea.  There is clear REM sleep dependence present but as discussed above a positional dependence was not seen.  There is also no associated hypoxia.  I would recommend to treat this degree of apnea because of the history of cardiac palpitations that was stated.  An auto titration CPAP would still be indicated; If the patient still has her CPAP machine we will set her up for a window between 6 and 18 cm water pressure with 3 cm EPR, she will be set up with a mask of her choice which does not have to be a full facemask.  Heated humidification should be provided and I prefer a ResMed device.  If the patient is unable to tolerate CPAP ,we have very few options in her case.  Her BMI is too high to qualify for inspire which has a cutoff of 32.   Also REM dependent apnea does not respond to inspire or dental device treatment.   In that case of not wanting CPAP ,my recommendation would be aggressive weight loss therapy and respiratory therapy with deep breathing exercises.

## 2021-12-01 NOTE — Telephone Encounter (Signed)
I called pt. I advised pt that Dr. Brett Fairy reviewed their sleep study results and found that pt has mild to moderate osa. Dr. Brett Fairy recommends that pt starts auto CPAP. I reviewed PAP compliance expectations with the pt. Pt is agreeable to starting a CPAP. Pt states that she has a CPAP already that she will retry using this. I advised pt that an order will be sent to a DME, [facility], and [facility] will call the pt within about one week after they file with the pt's insurance. [facility] will show the pt how to use the machine, fit for masks, and troubleshoot the CPAP if needed. A follow up appt was made for insurance purposes with Dr. Brett Fairy on May 24,2023 at 8:30 am. Pt verbalized understanding to arrive 15 minutes early and bring their CPAP. A letter with all of this information in it will be mailed to the pt as a reminder. I verified with the pt that the address we have on file is correct. Pt verbalized understanding of results. Pt had no questions at this time but was encouraged to call back if questions arise. I have sent the order to [facility] and have received confirmation that they have received the order.

## 2021-12-03 DIAGNOSIS — Z1231 Encounter for screening mammogram for malignant neoplasm of breast: Secondary | ICD-10-CM | POA: Diagnosis not present

## 2021-12-09 ENCOUNTER — Encounter: Payer: Self-pay | Admitting: Internal Medicine

## 2021-12-09 NOTE — Telephone Encounter (Signed)
Per [facility],  ?"she just received a machine in 2020 and I was able to get the pressure changed. I will put the order in and bring her in for new supplies." ? ?I will send a message to the patient as well. ?

## 2021-12-12 ENCOUNTER — Other Ambulatory Visit: Payer: Self-pay

## 2021-12-12 ENCOUNTER — Ambulatory Visit: Payer: BC Managed Care – PPO

## 2021-12-15 ENCOUNTER — Ambulatory Visit (INDEPENDENT_AMBULATORY_CARE_PROVIDER_SITE_OTHER): Payer: BC Managed Care – PPO | Admitting: *Deleted

## 2021-12-15 ENCOUNTER — Other Ambulatory Visit: Payer: Self-pay

## 2021-12-15 DIAGNOSIS — J4550 Severe persistent asthma, uncomplicated: Secondary | ICD-10-CM | POA: Diagnosis not present

## 2021-12-15 DIAGNOSIS — J3089 Other allergic rhinitis: Secondary | ICD-10-CM | POA: Diagnosis not present

## 2021-12-15 NOTE — Progress Notes (Signed)
VIALS EXP 12-16-22. LAST VIALS WILL BE MADE.  THIS IS 4TH TIME RESTARTING. ?

## 2021-12-16 ENCOUNTER — Ambulatory Visit (INDEPENDENT_AMBULATORY_CARE_PROVIDER_SITE_OTHER): Payer: BC Managed Care – PPO | Admitting: Internal Medicine

## 2021-12-16 ENCOUNTER — Encounter: Payer: Self-pay | Admitting: Internal Medicine

## 2021-12-16 ENCOUNTER — Other Ambulatory Visit: Payer: Self-pay

## 2021-12-16 VITALS — BP 130/80 | HR 84 | Temp 98.1°F | Ht 68.0 in | Wt 301.8 lb

## 2021-12-16 DIAGNOSIS — Z23 Encounter for immunization: Secondary | ICD-10-CM | POA: Diagnosis not present

## 2021-12-16 DIAGNOSIS — Z6841 Body Mass Index (BMI) 40.0 and over, adult: Secondary | ICD-10-CM | POA: Diagnosis not present

## 2021-12-16 DIAGNOSIS — Z0000 Encounter for general adult medical examination without abnormal findings: Secondary | ICD-10-CM | POA: Diagnosis not present

## 2021-12-16 DIAGNOSIS — I119 Hypertensive heart disease without heart failure: Secondary | ICD-10-CM

## 2021-12-16 DIAGNOSIS — I480 Paroxysmal atrial fibrillation: Secondary | ICD-10-CM

## 2021-12-16 LAB — POCT URINALYSIS DIPSTICK
Bilirubin, UA: NEGATIVE
Blood, UA: NEGATIVE
Glucose, UA: NEGATIVE
Ketones, UA: NEGATIVE
Leukocytes, UA: NEGATIVE
Nitrite, UA: NEGATIVE
Protein, UA: NEGATIVE
Spec Grav, UA: 1.02 (ref 1.010–1.025)
Urobilinogen, UA: 0.2 E.U./dL
pH, UA: 6 (ref 5.0–8.0)

## 2021-12-16 NOTE — Progress Notes (Signed)
?Jeri Cos Llittleton,acting as a Neurosurgeon for [name], MD.,have documented all relevant documentation on the behalf of [name], MD,as directed by  [name], MD while in the presence of [name], MD.  ?This visit occurred during the SARS-CoV-2 public health emergency.  Safety protocols were in place, including screening questions prior to the visit, additional usage of staff PPE, and extensive cleaning of exam room while observing appropriate contact time as indicated for disinfecting solutions. ? ?Subjective:  ?  ? Patient ID: Tracey Escobar , female    DOB: 11/16/1970 , 51 y.o.   MRN: [id number] ? ? ?Chief Complaint  ?Patient presents with  ? Annual Exam  ? ? ?HPI ? ?She is here today for a full physical examination.  She is followed by Dr. Estanislado Pandy for her GYN exams.  She states she recently had a Pap smear. She reports compliance with meds.  ? ?Hypertension ?This is a chronic problem. The current episode started more than 1 year ago. The problem has been gradually improving since onset. The problem is controlled. Pertinent negatives include no blurred vision, chest pain, palpitations or shortness of breath. Risk factors for coronary artery disease include obesity. Past treatments include lifestyle changes, angiotensin blockers and diuretics. The current treatment provides moderate improvement.   ? ?Past Medical History:  ?Diagnosis Date  ? Arthritis   ? Asthma   ? Atrial fibrillation (HCC)   ? Dysrhythmia   ? A-Fib  ? GERD (gastroesophageal reflux disease)   ? Hypertension   ? Irritable bowel   ? Pneumonia   ? Sleep apnea   ? Sleep apnea   ? Vitamin D deficiency   ?  ? ?Family History  ?Problem Relation Age of Onset  ? Hyperlipidemia Mother   ? Hypertension Mother   ? Hyperlipidemia Father   ? Hypertension Father   ? COPD Father   ? Heart failure Father   ? Diabetes Brother   ? Hypertension Maternal Grandmother   ? Seizures Maternal Grandmother   ? Cancer Paternal Grandfather   ?  Cancer Brother   ? Allergic rhinitis Neg Hx   ? Angioedema Neg Hx   ? Asthma Neg Hx   ? Eczema Neg Hx   ? Immunodeficiency Neg Hx   ? Urticaria Neg Hx   ? ? ? ?Current Outpatient Medications:  ?  budesonide-formoterol (SYMBICORT) 160-4.5 MCG/ACT inhaler, Inhale 2 puffs into the lungs 2 (two) times daily., Disp: , Rfl:  ?  Cholecalciferol (VITAMIN D3) 5000 UNITS TABS, Take 1 tablet by mouth daily., Disp: , Rfl:  ?  Estradiol 10 MCG TABS vaginal tablet, estradiol 10 mcg vaginal tablet, Disp: , Rfl:  ?  flecainide (TAMBOCOR) 100 MG tablet, Take by mouth., Disp: , Rfl:  ?  hydrochlorothiazide (HYDRODIURIL) 12.5 MG tablet, TAKE 1 TABLET(12.5 MG) BY MOUTH DAILY, Disp: 90 tablet, Rfl: 1 ?  levocetirizine (XYZAL) 5 MG tablet, TAKE 1 TABLET BY MOUTH EVERY DAY IN THE EVENING, Disp: 90 tablet, Rfl: 1 ?  losartan (COZAAR) 25 MG tablet, TAKE 1 TABLET(25 MG) BY MOUTH DAILY, Disp: 90 tablet, Rfl: 1 ?  LUMIFY 0.025 % SOLN, SMARTSIG:1 Drop(s) In Eye(s) Every 12 Hours PRN, Disp: , Rfl:  ?  medroxyPROGESTERone (DEPO-PROVERA) 150 MG/ML injection, medroxyprogesterone 150 mg/mL intramuscular suspension  Inject 1 mL every 3 months by intramuscular route around the clock., Disp: , Rfl:  ?  metoprolol succinate (TOPROL-XL) 100 MG 24 hr tablet, Take 100 mg by mouth daily. ,  Disp: , Rfl:  ?  montelukast (SINGULAIR) 10 MG tablet, Take 1 tablet (10 mg total) by mouth at bedtime., Disp: 30 tablet, Rfl: 0 ?  NUCALA 100 MG/ML SOAJ, Inject into the skin., Disp: , Rfl:  ?  pantoprazole (PROTONIX) 40 MG tablet, TAKE 1 TABLET(40 MG) BY MOUTH DAILY, Disp: 30 tablet, Rfl: 0 ?  potassium chloride (KLOR-CON) 10 MEQ tablet, potassium chloride ER 10 mEq tablet,extended release  TAKE ONE TABLET BY MOUTH DAILY FOR 14 DAYS, Disp: , Rfl:  ?  RESTASIS 0.05 % ophthalmic emulsion, , Disp: , Rfl:  ?  rivaroxaban (XARELTO) 20 MG TABS tablet, TAKE 1 TABLET(20 MG) BY MOUTH WITH SUPPER, Disp: , Rfl:  ?  Armodafinil 150 MG tablet, Take 1 tablet (150 mg total) by mouth  daily. (Patient not taking: Reported on 12/16/2021), Disp: 30 tablet, Rfl: 5 ?  Fluticasone-Umeclidin-Vilant (TRELEGY ELLIPTA) 200-62.5-25 MCG/INH AEPB, Inhale 1 puff into the lungs daily. (Patient not taking: Reported on 12/16/2021), Disp: 28 each, Rfl: 5 ? ?Current Facility-Administered Medications:  ?  cyanocobalamin ((VITAMIN B-12)) injection 1,000 mcg, 1,000 mcg, Intramuscular, Once, Ghumman, Ramandeep, NP ?  Mepolizumab SOLR 100 mg, 100 mg, Subcutaneous, Q28 days, Ellamae Sia, DO, 100 mg at 12/15/21 4332  ? ?No Known Allergies  ? ? ?The patient states she uses Depo-Provera injections for birth control. Last LMP was No LMP recorded. Patient has had an injection.. Negative for Dysmenorrhea. Negative for: breast discharge, breast lump(s), breast pain and breast self exam. Associated symptoms include abnormal vaginal bleeding. Pertinent negatives include abnormal bleeding (hematology), anxiety, decreased libido, depression, difficulty falling sleep, dyspareunia, history of infertility, nocturia, sexual dysfunction, sleep disturbances, urinary incontinence, urinary urgency, vaginal discharge and vaginal itching. Diet regular.The patient states her exercise level is   ? Marland Kitchen The patient's tobacco use is:  ?Social History  ? ?Tobacco Use  ?Smoking Status Never  ?Smokeless Tobacco Never  ?Marland Kitchen She has been exposed to passive smoke. The patient's alcohol use is:  ?Social History  ? ?Substance and Sexual Activity  ?Alcohol Use Yes  ? Alcohol/week: 1.0 standard drink  ? Types: 1 Glasses of wine per week  ? Comment: rare use  ? ? ?Review of Systems  ?Constitutional: Negative.   ?HENT: Negative.    ?Eyes: Negative.  Negative for blurred vision.  ?Respiratory: Negative.  Negative for shortness of breath.   ?Cardiovascular: Negative.  Negative for chest pain and palpitations.  ?Gastrointestinal: Negative.   ?Endocrine: Negative.   ?Genitourinary: Negative.   ?Musculoskeletal: Negative.   ?Skin: Negative.   ?Allergic/Immunologic:  Negative.   ?Neurological: Negative.   ?Hematological: Negative.   ?Psychiatric/Behavioral:  Positive for sleep disturbance.   ?     Oliva Bustard has helped with her sleep issues.    ? ?Today's Vitals  ? 12/16/21 0843  ?BP: 130/80  ?Pulse: 84  ?Temp: 98.1 ?F (36.7 ?C)  ?Weight: (!) 301 lb 12.8 oz (136.9 kg)  ?Height: 5\' 8"  (1.727 m)  ?PainSc: 0-No pain  ? ?Body mass index is 45.89 kg/m?.  ?Wt Readings from Last 3 Encounters:  ?12/16/21 (!) 301 lb 12.8 oz (136.9 kg)  ?11/17/21 (!) 308 lb 9.6 oz (140 kg)  ?10/30/21 (!) 311 lb 8.2 oz (141.3 kg)  ?  ? ?Objective:  ?Physical Exam ?Vitals and nursing note reviewed.  ?Constitutional:   ?   Appearance: Normal appearance.  ?HENT:  ?   Head: Normocephalic and atraumatic.  ?   Right Ear: Tympanic membrane, ear canal and external ear normal.  ?  Left Ear: Tympanic membrane, ear canal and external ear normal.  ?   Nose:  ?   Comments: Masked  ?   Mouth/Throat:  ?   Comments: Masked  ?Eyes:  ?   Extraocular Movements: Extraocular movements intact.  ?   Conjunctiva/sclera: Conjunctivae normal.  ?   Pupils: Pupils are equal, round, and reactive to light.  ?Cardiovascular:  ?   Rate and Rhythm: Normal rate and regular rhythm.  ?   Pulses: Normal pulses.  ?   Heart sounds: Normal heart sounds.  ?Pulmonary:  ?   Effort: Pulmonary effort is normal.  ?   Breath sounds: Normal breath sounds.  ?Chest:  ?Breasts: ?   Tanner Score is 5.  ?   Right: Normal.  ?   Left: Normal.  ?Abdominal:  ?   General: Bowel sounds are normal.  ?   Palpations: Abdomen is soft.  ?   Comments: Obese, soft. Difficult to assess organomegaly due to body habitus  ?Genitourinary: ?   Comments: deferred ?Musculoskeletal:     ?   General: Normal range of motion.  ?   Cervical back: Normal range of motion and neck supple.  ?Skin: ?   General: Skin is warm and dry.  ?Neurological:  ?   General: No focal deficit present.  ?   Mental Status: She is alert and oriented to person, place, and time.  ?Psychiatric:     ?   Mood  and Affect: Mood normal.     ?   Behavior: Behavior normal.  ?  ? ?   ?Assessment And Plan:  ?   ?1. Encounter for general adult medical examination w/o abnormal findings ?Comments: A full exam was performed. I

## 2021-12-16 NOTE — Patient Instructions (Signed)

## 2021-12-17 LAB — CMP14+EGFR
ALT: 10 IU/L (ref 0–32)
AST: 10 IU/L (ref 0–40)
Albumin/Globulin Ratio: 1.4 (ref 1.2–2.2)
Albumin: 4.4 g/dL (ref 3.8–4.9)
Alkaline Phosphatase: 104 IU/L (ref 44–121)
BUN/Creatinine Ratio: 13 (ref 9–23)
BUN: 11 mg/dL (ref 6–24)
Bilirubin Total: 0.4 mg/dL (ref 0.0–1.2)
CO2: 23 mmol/L (ref 20–29)
Calcium: 10.4 mg/dL — ABNORMAL HIGH (ref 8.7–10.2)
Chloride: 107 mmol/L — ABNORMAL HIGH (ref 96–106)
Creatinine, Ser: 0.84 mg/dL (ref 0.57–1.00)
Globulin, Total: 3.2 g/dL (ref 1.5–4.5)
Glucose: 85 mg/dL (ref 70–99)
Potassium: 3.9 mmol/L (ref 3.5–5.2)
Sodium: 143 mmol/L (ref 134–144)
Total Protein: 7.6 g/dL (ref 6.0–8.5)
eGFR: 84 mL/min/{1.73_m2} (ref >59)

## 2021-12-17 LAB — CBC
Hematocrit: 41.6 % (ref 34.0–46.6)
Hemoglobin: 13.6 g/dL (ref 11.1–15.9)
MCH: 27.9 pg (ref 26.6–33.0)
MCHC: 32.7 g/dL (ref 31.5–35.7)
MCV: 85 fL (ref 79–97)
Platelets: 339 10*3/uL (ref 150–450)
RBC: 4.88 x10E6/uL (ref 3.77–5.28)
RDW: 13.2 % (ref 11.7–15.4)
WBC: 5 10*3/uL (ref 3.4–10.8)

## 2021-12-17 LAB — MICROALBUMIN / CREATININE URINE RATIO
Creatinine, Urine: 179.7 mg/dL
Microalb/Creat Ratio: 16 mg/g creat (ref 0–29)
Microalbumin, Urine: 29.1 ug/mL

## 2021-12-17 LAB — HEMOGLOBIN A1C
Est. average glucose Bld gHb Est-mCnc: 108 mg/dL
Hgb A1c MFr Bld: 5.4 % (ref 4.8–5.6)

## 2021-12-17 LAB — LIPID PANEL
Chol/HDL Ratio: 2.9 ratio (ref 0.0–4.4)
Cholesterol, Total: 138 mg/dL (ref 100–199)
HDL: 48 mg/dL (ref >39)
LDL Chol Calc (NIH): 74 mg/dL (ref 0–99)
Triglycerides: 80 mg/dL (ref 0–149)
VLDL Cholesterol Cal: 16 mg/dL (ref 5–40)

## 2021-12-21 ENCOUNTER — Other Ambulatory Visit: Payer: Self-pay | Admitting: Allergy & Immunology

## 2022-01-12 ENCOUNTER — Ambulatory Visit (INDEPENDENT_AMBULATORY_CARE_PROVIDER_SITE_OTHER): Payer: BC Managed Care – PPO

## 2022-01-12 DIAGNOSIS — J4550 Severe persistent asthma, uncomplicated: Secondary | ICD-10-CM

## 2022-01-14 ENCOUNTER — Other Ambulatory Visit: Payer: Self-pay

## 2022-01-14 ENCOUNTER — Ambulatory Visit (INDEPENDENT_AMBULATORY_CARE_PROVIDER_SITE_OTHER): Payer: BC Managed Care – PPO

## 2022-01-14 DIAGNOSIS — J309 Allergic rhinitis, unspecified: Secondary | ICD-10-CM

## 2022-01-14 MED ORDER — EPINEPHRINE 0.3 MG/0.3ML IJ SOAJ: 0.3000 mg | Freq: Once | INTRAMUSCULAR | 1 refills | Status: DC

## 2022-01-14 NOTE — Progress Notes (Signed)
Immunotherapy ? ? ?Patient Details  ?Name: Tracey Escobar ?MRN: [id number] ?Date of Birth: 03-Apr-1971 ? ?01/14/2022 ? ?[name] pt here to restart alergy shots ?Following schedule: b  ?Frequency weekly ?Epi-Pen:yes ?Consent signed and patient instructions given. ? ? ?[name] ?01/14/2022, 8:43 AM ? ? ?

## 2022-01-17 ENCOUNTER — Other Ambulatory Visit: Payer: Self-pay | Admitting: Allergy & Immunology

## 2022-01-23 ENCOUNTER — Ambulatory Visit (INDEPENDENT_AMBULATORY_CARE_PROVIDER_SITE_OTHER): Payer: BC Managed Care – PPO

## 2022-01-23 ENCOUNTER — Telehealth: Payer: Self-pay | Admitting: Neurology

## 2022-01-23 DIAGNOSIS — J309 Allergic rhinitis, unspecified: Secondary | ICD-10-CM | POA: Diagnosis not present

## 2022-01-23 DIAGNOSIS — G4733 Obstructive sleep apnea (adult) (pediatric): Secondary | ICD-10-CM

## 2022-01-23 NOTE — Telephone Encounter (Signed)
Pt is asking to be called on Monday to discuss the CPAP forcing out too much air. ?

## 2022-01-26 NOTE — Addendum Note (Signed)
Addended by: Judi Cong on: 01/26/2022 08:39 AM ? ? Modules accepted: Orders ? ?

## 2022-01-26 NOTE — Telephone Encounter (Signed)
Called the pt back. There was no answer. Left a detailed message advising the patient that her machine no longer picks up data wirelessly and in order for [place] to be able to determine changes that would need to be made in regards to her pressure we would need for her to bring the machine by for a reading of the CPAP.  ? ?**When pt calls back feel free to get more information on how she feels when using the machine and advise that for [place] to determine if we need to make changes she would need to bring the machine to our office so we can pull a download. Alternatively she can contact [employer] and ask to take it there and have them download and see if they can troubleshoot her concerns while there.  ?

## 2022-01-26 NOTE — Telephone Encounter (Addendum)
I read incorrectly. The patient machine quick reading after 01/16/2022. However, there is no data on there. Pt states that may be true because she can't tolerate to even put it on. I advised that I would recommend that she have a education appointment with [employer] and allow them to review the machine and make sure her mask fits properly. Provided the patient with a phone number to contact and schedule this appt ?

## 2022-01-28 ENCOUNTER — Ambulatory Visit (INDEPENDENT_AMBULATORY_CARE_PROVIDER_SITE_OTHER): Payer: BC Managed Care – PPO

## 2022-01-28 DIAGNOSIS — J309 Allergic rhinitis, unspecified: Secondary | ICD-10-CM | POA: Diagnosis not present

## 2022-01-30 ENCOUNTER — Other Ambulatory Visit: Payer: Self-pay | Admitting: Internal Medicine

## 2022-01-30 ENCOUNTER — Encounter: Payer: Self-pay | Admitting: Internal Medicine

## 2022-01-30 MED ORDER — LINACLOTIDE 290 MCG PO CAPS: 290.0000 ug | ORAL_CAPSULE | Freq: Every day | ORAL | 2 refills | Status: DC

## 2022-02-02 DIAGNOSIS — G4733 Obstructive sleep apnea (adult) (pediatric): Secondary | ICD-10-CM | POA: Diagnosis not present

## 2022-02-04 ENCOUNTER — Ambulatory Visit (INDEPENDENT_AMBULATORY_CARE_PROVIDER_SITE_OTHER): Payer: BC Managed Care – PPO

## 2022-02-04 ENCOUNTER — Ambulatory Visit: Payer: BC Managed Care – PPO | Admitting: Neurology

## 2022-02-04 DIAGNOSIS — J309 Allergic rhinitis, unspecified: Secondary | ICD-10-CM

## 2022-02-06 DIAGNOSIS — M654 Radial styloid tenosynovitis [de Quervain]: Secondary | ICD-10-CM | POA: Diagnosis not present

## 2022-02-06 DIAGNOSIS — M25531 Pain in right wrist: Secondary | ICD-10-CM | POA: Diagnosis not present

## 2022-02-10 ENCOUNTER — Ambulatory Visit: Payer: BC Managed Care – PPO | Admitting: Internal Medicine

## 2022-02-10 ENCOUNTER — Encounter: Payer: Self-pay | Admitting: Internal Medicine

## 2022-02-10 VITALS — BP 114/80 | HR 76 | Temp 98.6°F | Ht 67.4 in | Wt 306.6 lb

## 2022-02-10 DIAGNOSIS — K5909 Other constipation: Secondary | ICD-10-CM | POA: Diagnosis not present

## 2022-02-10 DIAGNOSIS — Z6841 Body Mass Index (BMI) 40.0 and over, adult: Secondary | ICD-10-CM

## 2022-02-10 NOTE — Patient Instructions (Signed)

## 2022-02-10 NOTE — Progress Notes (Signed)
?Jeri Cos Llittleton,acting as a Neurosurgeon for Tracey Aliment, MD.,have documented all relevant documentation on the behalf of Tracey Aliment, MD,as directed by  Tracey Aliment, MD while in the presence of Tracey Aliment, MD.  ?This visit occurred during the SARS-CoV-2 public health emergency.  Safety protocols were in place, including screening questions prior to the visit, additional usage of staff PPE, and extensive cleaning of exam room while observing appropriate contact time as indicated for disinfecting solutions. ? ?Subjective:  ?  ? Patient ID: Tracey Escobar , female    DOB: 05/11/1971 , 51 y.o.   MRN: [id number] ? ? ?Chief Complaint  ?Patient presents with  ? Weight Check  ? ? ?HPI ? ?Patient presents today for f/u obesity and insulin resistance. She states she initially did well w/ Rybelsus; however, she felt it was no longer effective after two weeks. Therefore, she has stopped the medication.  ? ?Additionally, she complains of worsening chronic constipation. Unfortunately, her insurance will also not cover Linzess. [employer]ulance is the preferred agent, but she states this is ineffective.  ?  ? ?Past Medical History:  ?Diagnosis Date  ? Arthritis   ? Asthma   ? Atrial fibrillation (HCC)   ? Dysrhythmia   ? A-Fib  ? GERD (gastroesophageal reflux disease)   ? Hypertension   ? Irritable bowel   ? Pneumonia   ? Sleep apnea   ? Sleep apnea   ? Vitamin D deficiency   ?  ? ?Family History  ?Problem Relation Age of Onset  ? Hyperlipidemia Mother   ? Hypertension Mother   ? Hyperlipidemia Father   ? Hypertension Father   ? COPD Father   ? Heart failure Father   ? Diabetes Brother   ? Hypertension Maternal Grandmother   ? Seizures Maternal Grandmother   ? Cancer Paternal Grandfather   ? Cancer Brother   ? Allergic rhinitis Neg Hx   ? Angioedema Neg Hx   ? Asthma Neg Hx   ? Eczema Neg Hx   ? Immunodeficiency Neg Hx   ? Urticaria Neg Hx   ? ? ? ?Current Outpatient Medications:  ?  CALCIUM PO, Take 1 tablet by mouth  daily at 2 am., Disp: , Rfl:  ?  Cholecalciferol (VITAMIN D3) 5000 UNITS TABS, Take 1 tablet by mouth daily., Disp: , Rfl:  ?  EPINEPHRINE 0.3 mg/0.3 mL IJ SOAJ injection, ADMINISTER 0.3 MG IN THE MUSCLE 1 TIME FOR 1 DOSE, Disp: 2 each, Rfl: 1 ?  Estradiol 10 MCG TABS vaginal tablet, estradiol 10 mcg vaginal tablet, Disp: , Rfl:  ?  flecainide (TAMBOCOR) 100 MG tablet, Take by mouth., Disp: , Rfl:  ?  Fluticasone-Umeclidin-Vilant (TRELEGY ELLIPTA) 200-62.5-25 MCG/INH AEPB, Inhale 1 puff into the lungs daily., Disp: 28 each, Rfl: 5 ?  hydrochlorothiazide (HYDRODIURIL) 12.5 MG tablet, TAKE 1 TABLET(12.5 MG) BY MOUTH DAILY, Disp: 90 tablet, Rfl: 1 ?  levocetirizine (XYZAL) 5 MG tablet, TAKE 1 TABLET BY MOUTH EVERY DAY IN THE EVENING, Disp: 90 tablet, Rfl: 1 ?  linaclotide (LINZESS) 290 MCG CAPS capsule, Take 1 capsule (290 mcg total) by mouth daily before breakfast., Disp: 90 capsule, Rfl: 2 ?  losartan (COZAAR) 25 MG tablet, TAKE 1 TABLET(25 MG) BY MOUTH DAILY, Disp: 90 tablet, Rfl: 1 ?  medroxyPROGESTERone (DEPO-PROVERA) 150 MG/ML injection, medroxyprogesterone 150 mg/mL intramuscular suspension  Inject 1 mL every 3 months by intramuscular route around the clock., Disp: , Rfl:  ?  metoprolol succinate (TOPROL-XL)  100 MG 24 hr tablet, Take 100 mg by mouth daily. , Disp: , Rfl:  ?  montelukast (SINGULAIR) 10 MG tablet, TAKE 1 TABLET(10 MG) BY MOUTH AT BEDTIME, Disp: 30 tablet, Rfl: 5 ?  NUCALA 100 MG/ML SOAJ, Inject into the skin., Disp: , Rfl:  ?  pantoprazole (PROTONIX) 40 MG tablet, TAKE 1 TABLET(40 MG) BY MOUTH DAILY, Disp: 30 tablet, Rfl: 0 ?  RESTASIS 0.05 % ophthalmic emulsion, , Disp: , Rfl:  ?  rivaroxaban (XARELTO) 20 MG TABS tablet, TAKE 1 TABLET(20 MG) BY MOUTH WITH SUPPER, Disp: , Rfl:  ?  Armodafinil 150 MG tablet, Take 1 tablet (150 mg total) by mouth daily. (Patient not taking: Reported on 12/16/2021), Disp: 30 tablet, Rfl: 5 ?  budesonide-formoterol (SYMBICORT) 160-4.5 MCG/ACT inhaler, Inhale 2 puffs  into the lungs 2 (two) times daily. (Patient not taking: Reported on 02/10/2022), Disp: , Rfl:  ?  potassium chloride (KLOR-CON) 10 MEQ tablet, potassium chloride ER 10 mEq tablet,extended release  TAKE ONE TABLET BY MOUTH DAILY FOR 14 DAYS (Patient not taking: Reported on 02/10/2022), Disp: , Rfl:  ? ?Current Facility-Administered Medications:  ?  cyanocobalamin ((VITAMIN B-12)) injection 1,000 mcg, 1,000 mcg, Intramuscular, Once, Ghumman, Ramandeep, NP ?  Mepolizumab SOLR 100 mg, 100 mg, Subcutaneous, Q28 days, Ellamae Sia, DO, 100 mg at 02/11/22 0919  ? ?No Known Allergies  ? ?Review of Systems  ?Constitutional: Negative.   ?Respiratory: Negative.    ?Cardiovascular: Negative.   ?Gastrointestinal:  Positive for constipation.  ?Neurological: Negative.   ?Psychiatric/Behavioral: Negative.     ? ?Today's Vitals  ? 02/10/22 1624  ?BP: 114/80  ?Pulse: 76  ?Temp: 98.6 ?F (37 ?C)  ?Weight: (!) 306 lb 9.6 oz (139.1 kg)  ?Height: 5' 7.4" (1.712 m)  ?PainSc: 0-No pain  ? ?Body mass index is 47.45 kg/m?.  ?Wt Readings from Last 3 Encounters:  ?02/10/22 (!) 306 lb 9.6 oz (139.1 kg)  ?12/16/21 (!) 301 lb 12.8 oz (136.9 kg)  ?11/17/21 (!) 308 lb 9.6 oz (140 kg)  ?  ?Objective:  ?Physical Exam ?Vitals and nursing note reviewed.  ?Constitutional:   ?   Appearance: Normal appearance. She is obese.  ?HENT:  ?   Head: Normocephalic and atraumatic.  ?Eyes:  ?   Extraocular Movements: Extraocular movements intact.  ?Cardiovascular:  ?   Rate and Rhythm: Normal rate and regular rhythm.  ?   Heart sounds: Normal heart sounds.  ?Pulmonary:  ?   Effort: Pulmonary effort is normal.  ?   Breath sounds: Normal breath sounds.  ?Musculoskeletal:  ?   Cervical back: Normal range of motion.  ?Skin: ?   General: Skin is warm.  ?Neurological:  ?   General: No focal deficit present.  ?   Mental Status: She is alert.  ?Psychiatric:     ?   Mood and Affect: Mood normal.     ?   Behavior: Behavior normal.  ?   ?Assessment And Plan:  ?   ?1. Class 3  severe obesity due to excess calories with serious comorbidity and body mass index (BMI) of 45.0 to 49.9 in adult [facility]) ?Comments: BMI 47. She agrees to referral to [facility] clinic.  She is encouraged to aim for at least 150 minutes of exercise/week.  ?- Amb Ref to Medical Weight Management ? ?2. Chronic constipation ?Comments: She was given samples of Linzess, 290mg  daily. She will call me after 6/1 when new plan starts, and I will attempt PA again.  ?  ?  Patient was given opportunity to ask questions. Patient verbalized understanding of the plan and was able to repeat key elements of the plan. All questions were answered to their satisfaction.  ? ?I, Tracey Alimentobyn N Jabria Loos, MD, have reviewed all documentation for this visit. The documentation on 02/10/22 for the exam, diagnosis, procedures, and orders are all accurate and complete.  ? ?IF YOU HAVE BEEN REFERRED TO A SPECIALIST, IT MAY TAKE 1-2 WEEKS TO SCHEDULE/PROCESS THE REFERRAL. IF YOU HAVE NOT HEARD FROM US/SPECIALIST IN TWO WEEKS, PLEASE GIVE US A CALL AT [phone].  ? ?THE PATIENT IS ENCOURAGED TO PRACTICE SOCIAL DISTANCING DUE TO THE COVID-19 PANDEMIC.   ?

## 2022-02-11 ENCOUNTER — Ambulatory Visit (INDEPENDENT_AMBULATORY_CARE_PROVIDER_SITE_OTHER): Payer: BC Managed Care – PPO

## 2022-02-11 DIAGNOSIS — J4550 Severe persistent asthma, uncomplicated: Secondary | ICD-10-CM | POA: Diagnosis not present

## 2022-02-11 DIAGNOSIS — K5909 Other constipation: Secondary | ICD-10-CM | POA: Insufficient documentation

## 2022-02-18 ENCOUNTER — Encounter: Payer: Self-pay | Admitting: Internal Medicine

## 2022-02-18 ENCOUNTER — Ambulatory Visit (INDEPENDENT_AMBULATORY_CARE_PROVIDER_SITE_OTHER): Payer: BC Managed Care – PPO

## 2022-02-18 DIAGNOSIS — J309 Allergic rhinitis, unspecified: Secondary | ICD-10-CM

## 2022-02-18 DIAGNOSIS — I480 Paroxysmal atrial fibrillation: Secondary | ICD-10-CM | POA: Diagnosis not present

## 2022-02-18 DIAGNOSIS — G4733 Obstructive sleep apnea (adult) (pediatric): Secondary | ICD-10-CM | POA: Diagnosis not present

## 2022-02-18 DIAGNOSIS — I10 Essential (primary) hypertension: Secondary | ICD-10-CM | POA: Diagnosis not present

## 2022-02-20 ENCOUNTER — Other Ambulatory Visit: Payer: Self-pay | Admitting: Allergy & Immunology

## 2022-02-24 DIAGNOSIS — Z3040 Encounter for surveillance of contraceptives, unspecified: Secondary | ICD-10-CM | POA: Diagnosis not present

## 2022-02-25 ENCOUNTER — Ambulatory Visit (INDEPENDENT_AMBULATORY_CARE_PROVIDER_SITE_OTHER): Payer: BC Managed Care – PPO

## 2022-02-25 ENCOUNTER — Ambulatory Visit: Payer: BC Managed Care – PPO | Admitting: Neurology

## 2022-02-25 DIAGNOSIS — J309 Allergic rhinitis, unspecified: Secondary | ICD-10-CM | POA: Diagnosis not present

## 2022-03-04 ENCOUNTER — Ambulatory Visit (INDEPENDENT_AMBULATORY_CARE_PROVIDER_SITE_OTHER): Payer: BC Managed Care – PPO

## 2022-03-04 DIAGNOSIS — J309 Allergic rhinitis, unspecified: Secondary | ICD-10-CM | POA: Diagnosis not present

## 2022-03-09 ENCOUNTER — Other Ambulatory Visit: Payer: Self-pay | Admitting: Internal Medicine

## 2022-03-11 ENCOUNTER — Encounter: Payer: Self-pay | Admitting: Internal Medicine

## 2022-03-11 ENCOUNTER — Ambulatory Visit (INDEPENDENT_AMBULATORY_CARE_PROVIDER_SITE_OTHER): Payer: BC Managed Care – PPO

## 2022-03-11 DIAGNOSIS — J309 Allergic rhinitis, unspecified: Secondary | ICD-10-CM

## 2022-03-13 ENCOUNTER — Ambulatory Visit (INDEPENDENT_AMBULATORY_CARE_PROVIDER_SITE_OTHER): Payer: BC Managed Care – PPO

## 2022-03-13 DIAGNOSIS — J309 Allergic rhinitis, unspecified: Secondary | ICD-10-CM

## 2022-03-14 ENCOUNTER — Other Ambulatory Visit: Payer: Self-pay | Admitting: Internal Medicine

## 2022-03-14 MED ORDER — DAYVIGO 10 MG PO TABS: 10.0000 mg | ORAL_TABLET | Freq: Every evening | ORAL | 0 refills | Status: DC | PRN

## 2022-03-16 ENCOUNTER — Other Ambulatory Visit: Payer: Self-pay

## 2022-03-16 DIAGNOSIS — K5909 Other constipation: Secondary | ICD-10-CM

## 2022-03-25 ENCOUNTER — Other Ambulatory Visit: Payer: Self-pay | Admitting: Internal Medicine

## 2022-03-25 MED ORDER — DAYVIGO 10 MG PO TABS: 10.0000 mg | ORAL_TABLET | Freq: Every evening | ORAL | 2 refills | Status: DC | PRN

## 2022-04-02 ENCOUNTER — Other Ambulatory Visit: Payer: Self-pay | Admitting: Internal Medicine

## 2022-04-03 ENCOUNTER — Ambulatory Visit (INDEPENDENT_AMBULATORY_CARE_PROVIDER_SITE_OTHER): Payer: BC Managed Care – PPO

## 2022-04-03 ENCOUNTER — Other Ambulatory Visit: Payer: Self-pay

## 2022-04-03 DIAGNOSIS — J309 Allergic rhinitis, unspecified: Secondary | ICD-10-CM

## 2022-04-03 MED ORDER — FLUTICASONE-UMECLIDIN-VILANT 200-62.5-25 MCG/ACT IN AEPB: 1.0000 | INHALATION_SPRAY | Freq: Every day | RESPIRATORY_TRACT | 0 refills | Status: DC

## 2022-04-04 ENCOUNTER — Other Ambulatory Visit: Payer: Self-pay | Admitting: Internal Medicine

## 2022-04-08 DIAGNOSIS — R1084 Generalized abdominal pain: Secondary | ICD-10-CM | POA: Diagnosis not present

## 2022-04-08 DIAGNOSIS — K5900 Constipation, unspecified: Secondary | ICD-10-CM | POA: Diagnosis not present

## 2022-04-08 DIAGNOSIS — R140 Abdominal distension (gaseous): Secondary | ICD-10-CM | POA: Diagnosis not present

## 2022-04-09 ENCOUNTER — Ambulatory Visit (INDEPENDENT_AMBULATORY_CARE_PROVIDER_SITE_OTHER): Payer: BC Managed Care – PPO

## 2022-04-09 DIAGNOSIS — J4550 Severe persistent asthma, uncomplicated: Secondary | ICD-10-CM

## 2022-04-14 ENCOUNTER — Encounter: Payer: Self-pay | Admitting: Internal Medicine

## 2022-04-14 ENCOUNTER — Other Ambulatory Visit: Payer: Self-pay | Admitting: Internal Medicine

## 2022-04-14 MED ORDER — DOXEPIN HCL 6 MG PO TABS: 6.0000 mg | ORAL_TABLET | Freq: Every evening | ORAL | 1 refills | Status: DC | PRN

## 2022-04-15 ENCOUNTER — Ambulatory Visit: Payer: BC Managed Care – PPO | Admitting: Neurology

## 2022-04-30 ENCOUNTER — Ambulatory Visit: Payer: BC Managed Care – PPO | Admitting: Allergy & Immunology

## 2022-05-06 ENCOUNTER — Ambulatory Visit: Payer: BC Managed Care – PPO

## 2022-05-07 ENCOUNTER — Other Ambulatory Visit: Payer: Self-pay

## 2022-05-07 MED ORDER — FLUTICASONE-UMECLIDIN-VILANT 200-62.5-25 MCG/ACT IN AEPB: 1.0000 | INHALATION_SPRAY | Freq: Every day | RESPIRATORY_TRACT | 0 refills | Status: DC

## 2022-05-07 NOTE — Telephone Encounter (Signed)
Courtesy refill for Trelegy given x 1 with no refills at [facility]. Patient is due for an OV

## 2022-05-11 DIAGNOSIS — Z3040 Encounter for surveillance of contraceptives, unspecified: Secondary | ICD-10-CM | POA: Diagnosis not present

## 2022-05-12 ENCOUNTER — Ambulatory Visit: Payer: Self-pay

## 2022-05-12 NOTE — Patient Outreach (Signed)
  Care Coordination   05/12/2022 Name: Tracey Escobar MRN: [id number] DOB: [birth year]   Care Coordination Outreach Attempts:  An unsuccessful telephone outreach was attempted today to offer the patient information about available care coordination services as a benefit of their health plan.   Follow Up Plan:  Additional outreach attempts will be made to offer the patient care coordination information and services.   Encounter Outcome:  No Answer  Care Coordination Interventions Activated:  No   Care Coordination Interventions:  No, not indicated    Bevelyn Ngo, BSW, CDP Social Worker, Certified Dementia Practitioner Care Coordination [phone]

## 2022-05-13 ENCOUNTER — Encounter (INDEPENDENT_AMBULATORY_CARE_PROVIDER_SITE_OTHER): Payer: Self-pay

## 2022-05-13 ENCOUNTER — Ambulatory Visit: Payer: BC Managed Care – PPO

## 2022-05-14 ENCOUNTER — Ambulatory Visit: Payer: Self-pay

## 2022-05-14 NOTE — Patient Outreach (Signed)
  Care Coordination   05/14/2022 Name: Tracey Escobar MRN: [id number] DOB: [birth year]   Care Coordination Outreach Attempts:  A second unsuccessful outreach was attempted today to offer the patient with information about available care coordination services as a benefit of their health plan.     Follow Up Plan:  Additional outreach attempts will be made to offer the patient care coordination information and services.   Encounter Outcome:  No Answer  Care Coordination Interventions Activated:  No   Care Coordination Interventions:  No, not indicated    Bevelyn Ngo, BSW, CDP Social Worker, Certified Dementia Practitioner Care Coordination [phone]

## 2022-05-19 ENCOUNTER — Ambulatory Visit (INDEPENDENT_AMBULATORY_CARE_PROVIDER_SITE_OTHER): Payer: BC Managed Care – PPO

## 2022-05-19 DIAGNOSIS — J4550 Severe persistent asthma, uncomplicated: Secondary | ICD-10-CM | POA: Diagnosis not present

## 2022-05-22 ENCOUNTER — Ambulatory Visit (INDEPENDENT_AMBULATORY_CARE_PROVIDER_SITE_OTHER): Payer: BC Managed Care – PPO | Admitting: *Deleted

## 2022-05-22 DIAGNOSIS — J309 Allergic rhinitis, unspecified: Secondary | ICD-10-CM | POA: Diagnosis not present

## 2022-05-25 ENCOUNTER — Ambulatory Visit: Payer: Self-pay

## 2022-05-25 NOTE — Patient Outreach (Signed)
  Care Coordination   05/25/2022 Name: Tracey Escobar MRN: [id number] DOB: 1971/04/15   Care Coordination Outreach Attempts:  A third unsuccessful outreach was attempted today to offer the patient with information about available care coordination services as a benefit of their health plan.   Follow Up Plan:  No further outreach attempts will be made at this time. We have been unable to contact the patient to offer or enroll patient in care coordination services  Encounter Outcome:  No Answer  Care Coordination Interventions Activated:  No   Care Coordination Interventions:  No, not indicated    Bevelyn Ngo, BSW, CDP Social Worker, Certified Dementia Practitioner Care Coordination [phone]

## 2022-05-29 ENCOUNTER — Ambulatory Visit (INDEPENDENT_AMBULATORY_CARE_PROVIDER_SITE_OTHER): Payer: BC Managed Care – PPO | Admitting: *Deleted

## 2022-05-29 DIAGNOSIS — J309 Allergic rhinitis, unspecified: Secondary | ICD-10-CM | POA: Diagnosis not present

## 2022-06-02 ENCOUNTER — Ambulatory Visit (INDEPENDENT_AMBULATORY_CARE_PROVIDER_SITE_OTHER): Payer: BC Managed Care – PPO | Admitting: Allergy & Immunology

## 2022-06-02 VITALS — BP 126/70 | HR 87 | Temp 97.6°F | Resp 20 | Wt 308.0 lb

## 2022-06-02 DIAGNOSIS — J302 Other seasonal allergic rhinitis: Secondary | ICD-10-CM

## 2022-06-02 DIAGNOSIS — K219 Gastro-esophageal reflux disease without esophagitis: Secondary | ICD-10-CM | POA: Diagnosis not present

## 2022-06-02 DIAGNOSIS — J4540 Moderate persistent asthma, uncomplicated: Secondary | ICD-10-CM

## 2022-06-02 DIAGNOSIS — J3089 Other allergic rhinitis: Secondary | ICD-10-CM | POA: Diagnosis not present

## 2022-06-02 MED ORDER — FLUTICASONE-UMECLIDIN-VILANT 200-62.5-25 MCG/ACT IN AEPB: 1.0000 | INHALATION_SPRAY | Freq: Every day | RESPIRATORY_TRACT | 2 refills | Status: DC

## 2022-06-02 MED ORDER — RESTASIS 0.05 % OP EMUL: 1.0000 [drp] | Freq: Two times a day (BID) | OPHTHALMIC | 5 refills | Status: DC

## 2022-06-02 NOTE — Patient Instructions (Addendum)
1. Moderate persistent asthma without complication - Lung testing very stable.  - I might consider doing full pulmonary function testing at the next visit if your spirometry continues to look bad.  - We will wait until next time to see where your spirometry heading.  - Daily controller medication(s): Trelegy one puff once daily + Nucala monthly - Prior to physical activity: albuterol 2 puffs 10-15 minutes before physical activity. - Rescue medications: albuterol 4 puffs every 4-6 hours as needed - Asthma control goals:  * Full participation in all desired activities (may need albuterol before activity) * Albuterol use two time or less a week on average (not counting use with activity) * Cough interfering with sleep two time or less a month * Oral steroids no more than once a year * No hospitalizations  2. Gastroesophageal reflux disease - Continue with omeprazole daily.   3. Seasonal and perennial allergic rhinitis (dust mites, grasses, trees) - We will continue with allergy shots at the same schedule.  - Continue with levocetirizine 5 mg daily. - We will continue with Dymista.  4. Return in about 6 months (around 12/03/2022).    Please inform us of any Emergency Department visits, hospitalizations, or changes in symptoms. Call us before going to the ED for breathing or allergy symptoms since we might be able to fit you in for a sick visit. Feel free to contact us anytime with any questions, problems, or concerns.  It was a pleasure to see you again today!  Websites that have reliable patient information: 1. American Academy of Asthma, Allergy, and Immunology: www.aaaai.org 2. Food Allergy Research and Education (FARE): foodallergy.org 3. Mothers of Asthmatics: http://www.asthmacommunitynetwork.org 4. American College of Allergy, Asthma, and Immunology: www.acaai.org   COVID-19 Vaccine Information can be found at:  [email or web address].[email or web address] For questions related to vaccine distribution or appointments, please email [email or web address] or call [phone].   We realize that you might be concerned about having an allergic reaction to the COVID19 vaccines. To help with that concern, WE ARE OFFERING THE COVID19 VACCINES IN OUR OFFICE! Ask the front desk for dates!     "Like" [place] on Facebook and Instagram for our latest updates!      A healthy democracy works best when [employer] participate! Make sure you are registered to vote! If you have moved or changed any of your contact information, you will need to get this updated before voting!  In some cases, you MAY be able to register to vote online: [email or web address]

## 2022-06-02 NOTE — Progress Notes (Signed)
FOLLOW UP  Date of Service/Encounter:  06/02/22   Assessment:   Moderate persistent asthma, uncomplicated   Perennial allergic rhinitis (grasses, trees)  - with previous sensitization to dust mite in April 2015 as well   Gastroesophageal reflux disease   Atrial fibrillation - on Xarelto  Plan/Recommendations:    Patient Instructions  1. Moderate persistent asthma without complication - Lung testing very stable.  - I might consider doing full pulmonary function testing at the next visit if your spirometry continues to look bad.  - We will wait until next time to see where your spirometry heading.  - Daily controller medication(s): Trelegy one puff once daily + Nucala monthly - Prior to physical activity: albuterol 2 puffs 10-15 minutes before physical activity. - Rescue medications: albuterol 4 puffs every 4-6 hours as needed - Asthma control goals:  * Full participation in all desired activities (may need albuterol before activity) * Albuterol use two time or less a week on average (not counting use with activity) * Cough interfering with sleep two time or less a month * Oral steroids no more than once a year * No hospitalizations  2. Gastroesophageal reflux disease - Continue with omeprazole daily.   3. Seasonal and perennial allergic rhinitis (dust mites, grasses, trees) - We will continue with allergy shots at the same schedule.  - Continue with levocetirizine 5 mg daily. - We will continue with Dymista.  4. Return in about 6 months (around 12/03/2022).    Please inform us of any Emergency Department visits, hospitalizations, or changes in symptoms. Call us before going to the ED for breathing or allergy symptoms since we might be able to fit you in for a sick visit. Feel free to contact us anytime with any questions, problems, or concerns.  It was a pleasure to see you again today!  Websites that have reliable patient information: 1. American Academy of Asthma,  Allergy, and Immunology: www.aaaai.org 2. Food Allergy Research and Education (FARE): foodallergy.org 3. Mothers of Asthmatics: http://www.asthmacommunitynetwork.org 4. American College of Allergy, Asthma, and Immunology: www.acaai.org   COVID-19 Vaccine Information can be found at: [email or web address] For questions related to vaccine distribution or appointments, please email [email or web address] or call [phone].   We realize that you might be concerned about having an allergic reaction to the COVID19 vaccines. To help with that concern, WE ARE OFFERING THE COVID19 VACCINES IN OUR OFFICE! Ask the front desk for dates!     "Like" [place] on Facebook and Instagram for our latest updates!      A healthy democracy works best when [employer] participate! Make sure you are registered to vote! If you have moved or changed any of your contact information, you will need to get this updated before voting!  In some cases, you MAY be able to register to vote online: [email or web address]           Subjective:   Tracey Escobar is a 51 y.o. female presenting today for follow up of  Chief Complaint  Patient presents with  . Follow-up    Everything has been going well.    Tracey Escobar has a history of the following: Patient Active Problem List   Diagnosis Date Noted  . Chronic constipation 02/11/2022  . Irritable bowel syndrome with constipation 11/17/2021  . Arthralgia 11/17/2021  . Sleep deprivation 10/07/2021  . Excessive postexertional fatigue 10/07/2021  . Hypersomnia, persistent 10/07/2021  . OSA on CPAP 10/07/2021  . Allergic conjunctivitis of both eyes 06/21/2019  .  OSA (obstructive sleep apnea) 10/17/2018  . Class 3 severe obesity due to excess calories with serious comorbidity and body mass index (BMI) of 45.0 to 49.9 in adult (HCC) 08/09/2018  . Paroxysmal atrial fibrillation  (HCC) 08/09/2018  . Excessive daytime sleepiness 08/09/2018  . Cataplexy 08/09/2018  . Abnormal dreams 08/09/2018  . Nocturia more than twice per night 08/09/2018  . Hypertensive heart disease without heart failure 06/17/2018  . Insomnia 09/08/2017  . Morbid obesity (HCC) 09/08/2017  . Other long term (current) drug therapy 09/08/2017  . Paresthesia of skin 09/08/2017  . Snoring 09/08/2017  . Allergic conjunctivitis 09/08/2017  . Moderate persistent asthma 12/15/2016  . Atrial fibrillation with RVR (HCC) 12/15/2016  . Morbid obesity with BMI of 40.0-44.9, adult (HCC) 12/15/2016  . Acute sinusitis 09/02/2016  . Internal hemorrhoid 07/19/2015  . Gastrointestinal hemorrhage associated with anorectal source 07/17/2015  . Hypertensive disorder 07/17/2015  . Lower abdominal pain 07/17/2015  . Allergic rhinitis 06/11/2015  . Gastroesophageal reflux disease 06/11/2015    History obtained from: chart review and {Persons; PED relatives w/patient:19415::"patient"}.  Tracey Escobar is a 51 y.o. female presenting for {Blank single:19197::"a food challenge","a drug challenge","skin testing","a sick visit","an evaluation of ***","a follow up visit"}.  She was last seen in January 2023.  At that time, her lung testing was stable.  We continue with Trelegy 1 puff once daily as well as Nucala monthly.  For her allergic rhinitis, she started allergy shots patient.  We continue with Xyzal as well as Dymista as scheduled.  GERD was controlled with omeprazole.  Since last visit, she has done welltt  Asthma/Respiratory Symptom History: She remains on her Trelegy one puff once daily. This is mostly affordable. She uses her flex spending, She pays $35 per refill.  She denies any problems at all. She does not sleep at night; she has never really slept well at night.   {Blank single:19197::"Allergic Rhinitis Symptom History: ***"," "}  {Blank single:19197::"Food Allergy Symptom History: ***"," "}  {Blank  single:19197::"Skin Symptom History: ***"," "}  {Blank single:19197::"GERD Symptom History: ***"," "}  A fib is well controlled. She has had a couple of flares that come up out of nowhere. She remains on Xarelto. She is on metoprolol and felcanide. She started flecanide around one year ago. She was on a different drug before that, but she cannot remember the name.   She works at a [employer].    Otherwise, there have been no changes to her past medical history, surgical history, family history, or social history.    ROS     Objective:   Blood pressure 126/70, pulse 87, temperature 97.6 F (36.4 C), temperature source Temporal, resp. rate 20, weight (!) 308 lb (139.7 kg), SpO2 100 %. Body mass index is 47.67 kg/m.    Physical Exam   Diagnostic studies:    Spirometry: results abnormal (FEV1: 1.46/57%, FVC: 1.73/54%, FEV1/FVC: 84%).    Spirometry consistent with possible restrictive disease. {Blank single:19197::"Albuterol/Atrovent nebulizer","Xopenex/Atrovent nebulizer","Albuterol nebulizer","Albuterol four puffs via MDI","Xopenex four puffs via MDI"} treatment given in clinic with {Blank single:19197::"significant improvement in FEV1 per ATS criteria","significant improvement in FVC per ATS criteria","significant improvement in FEV1 and FVC per ATS criteria","improvement in FEV1, but not significant per ATS criteria","improvement in FVC, but not significant per ATS criteria","improvement in FEV1 and FVC, but not significant per ATS criteria","no improvement"}.  Allergy Studies: {Blank single:19197::"none","labs sent instead"," "}    {Blank single:19197::"Allergy testing results were read and interpreted by myself, documented by clinical staff."," "}  Salvatore Marvel, MD  [facility]

## 2022-06-04 ENCOUNTER — Encounter: Payer: Self-pay | Admitting: Allergy & Immunology

## 2022-06-16 ENCOUNTER — Ambulatory Visit: Payer: BC Managed Care – PPO

## 2022-06-18 ENCOUNTER — Encounter: Payer: BC Managed Care – PPO | Admitting: Internal Medicine

## 2022-06-18 ENCOUNTER — Ambulatory Visit (INDEPENDENT_AMBULATORY_CARE_PROVIDER_SITE_OTHER): Payer: BC Managed Care – PPO

## 2022-06-18 DIAGNOSIS — J4550 Severe persistent asthma, uncomplicated: Secondary | ICD-10-CM

## 2022-06-18 NOTE — Progress Notes (Signed)
DISREGARD - PT DID NOT SHOW   I,Tracey Escobar,acting as a scribe for Tracey Aliment, MD.,have documented all relevant documentation on the behalf of Tracey Aliment, MD,as directed by  Tracey Aliment, MD while in the presence of Tracey Aliment, MD.    Subjective:     Patient ID: Tracey Escobar , female    DOB: Mar 01, 1971 , 51 y.o.   MRN: [id number]   Chief Complaint  Patient presents with   Hypertension    HPI  Pt here today for b/p check.  She reports compliance with meds. Denies headaches, chest pain and palpitations.   Hypertension This is a chronic problem. The current episode started more than 1 year ago. The problem has been gradually improving since onset. The problem is controlled. Pertinent negatives include no blurred vision or headaches. Risk factors for coronary artery disease include obesity. Past treatments include lifestyle changes, angiotensin blockers and diuretics. The current treatment provides moderate improvement.     Past Medical History:  Diagnosis Date   Arthritis    Asthma    Atrial fibrillation (HCC)    Dysrhythmia    A-Fib   GERD (gastroesophageal reflux disease)    Hypertension    Irritable bowel    Pneumonia    Sleep apnea    Sleep apnea    Vitamin D deficiency      Family History  Problem Relation Age of Onset   Hyperlipidemia Mother    Hypertension Mother    Hyperlipidemia Father    Hypertension Father    COPD Father    Heart failure Father    Diabetes Brother    Hypertension Maternal Grandmother    Seizures Maternal Grandmother    Cancer Paternal Grandfather    Cancer Brother    Allergic rhinitis Neg Hx    Angioedema Neg Hx    Asthma Neg Hx    Eczema Neg Hx    Immunodeficiency Neg Hx    Urticaria Neg Hx      Current Outpatient Medications:    CALCIUM PO, Take 1 tablet by mouth daily at 2 am., Disp: , Rfl:    Doxepin HCl 6 MG TABS, Take 1 tablet (6 mg total) by mouth at bedtime as needed., Disp: 30 tablet, Rfl:  1   EPINEPHRINE 0.3 mg/0.3 mL IJ SOAJ injection, ADMINISTER 0.3 MG IN THE MUSCLE 1 TIME FOR 1 DOSE, Disp: 2 each, Rfl: 1   Estradiol 10 MCG TABS vaginal tablet, estradiol 10 mcg vaginal tablet, Disp: , Rfl:    flecainide (TAMBOCOR) 100 MG tablet, Take by mouth., Disp: , Rfl:    Fluticasone-Umeclidin-Vilant (TRELEGY ELLIPTA) 200-62.5-25 MCG/ACT AEPB, Inhale 1 puff into the lungs daily., Disp: 90 each, Rfl: 2   hydrochlorothiazide (HYDRODIURIL) 12.5 MG tablet, TAKE 1 TABLET(12.5 MG) BY MOUTH DAILY, Disp: 90 tablet, Rfl: 1   Lemborexant (DAYVIGO) 10 MG TABS, Take 10 mg by mouth at bedtime as needed., Disp: 30 tablet, Rfl: 2   levocetirizine (XYZAL) 5 MG tablet, TAKE 1 TABLET BY MOUTH EVERY DAY IN THE EVENING, Disp: 90 tablet, Rfl: 1   linaclotide (LINZESS) 290 MCG CAPS capsule, Take 1 capsule (290 mcg total) by mouth daily before breakfast., Disp: 90 capsule, Rfl: 2   losartan (COZAAR) 25 MG tablet, TAKE 1 TABLET(25 MG) BY MOUTH DAILY, Disp: 90 tablet, Rfl: 1   medroxyPROGESTERone (DEPO-PROVERA) 150 MG/ML injection, medroxyprogesterone 150 mg/mL intramuscular suspension  Inject 1 mL every 3 months by intramuscular route around the clock., Disp: ,  Rfl:    metoprolol succinate (TOPROL-XL) 100 MG 24 hr tablet, Take 100 mg by mouth daily. , Disp: , Rfl:    montelukast (SINGULAIR) 10 MG tablet, TAKE 1 TABLET(10 MG) BY MOUTH AT BEDTIME, Disp: 30 tablet, Rfl: 5   NUCALA 100 MG/ML SOAJ, INJECT THE CONTENTS OF 1 PEN (100 MG) UNDER THE SKIN EVERY 4 WEEKS, Disp: 1 mL, Rfl: 11   pantoprazole (PROTONIX) 40 MG tablet, TAKE 1 TABLET(40 MG) BY MOUTH DAILY, Disp: 90 tablet, Rfl: 1   RESTASIS 0.05 % ophthalmic emulsion, Place 1 drop into both eyes 2 (two) times daily., Disp: 5.5 mL, Rfl: 5   rivaroxaban (XARELTO) 20 MG TABS tablet, TAKE 1 TABLET(20 MG) BY MOUTH WITH SUPPER, Disp: , Rfl:   Current Facility-Administered Medications:    cyanocobalamin ((VITAMIN B-12)) injection 1,000 mcg, 1,000 mcg, Intramuscular, Once,  Ghumman, Ramandeep, NP   Mepolizumab SOLR 100 mg, 100 mg, Subcutaneous, Q28 days, Wyline Mood M, DO, 100 mg at 05/19/22 2542   No Known Allergies   Review of Systems  Constitutional: Negative.   Eyes: Negative.  Negative for blurred vision.  Respiratory: Negative.    Cardiovascular: Negative.   Musculoskeletal: Negative.   Skin: Negative.   Neurological: Negative.  Negative for headaches.  Psychiatric/Behavioral: Negative.       There were no vitals filed for this visit. There is no height or weight on file to calculate BMI.   Objective:  Physical Exam      Assessment And Plan:     1. Hypertensive heart disease without heart failure     Patient was given opportunity to ask questions. Patient verbalized understanding of the plan and was able to repeat key elements of the plan. All questions were answered to their satisfaction.  Tracey Escobar, CMA   I, Tracey Escobar, CMA, have reviewed all documentation for this visit. The documentation on 06/18/22 for the exam, diagnosis, procedures, and orders are all accurate and complete.   IF YOU HAVE BEEN REFERRED TO A SPECIALIST, IT MAY TAKE 1-2 WEEKS TO SCHEDULE/PROCESS THE REFERRAL. IF YOU HAVE NOT HEARD FROM US/SPECIALIST IN TWO WEEKS, PLEASE GIVE [place] A CALL AT [phone].   THE PATIENT IS ENCOURAGED TO PRACTICE SOCIAL DISTANCING DUE TO THE COVID-19 PANDEMIC.

## 2022-06-18 NOTE — Patient Instructions (Signed)
Hypertension, Adult ?Hypertension is another name for high blood pressure. High blood pressure forces your heart to work harder to pump blood. This can cause problems over time. ?There are two numbers in a blood pressure reading. There is a top number (systolic) over a bottom number (diastolic). It is best to have a blood pressure that is below 120/80. ?What are the causes? ?The cause of this condition is not known. Some other conditions can lead to high blood pressure. ?What increases the risk? ?Some lifestyle factors can make you more likely to develop high blood pressure: ?Smoking. ?Not getting enough exercise or physical activity. ?Being overweight. ?Having too much fat, sugar, calories, or salt (sodium) in your diet. ?Drinking too much alcohol. ?Other risk factors include: ?Having any of these conditions: ?Heart disease. ?Diabetes. ?High cholesterol. ?Kidney disease. ?Obstructive sleep apnea. ?Having a family history of high blood pressure and high cholesterol. ?Age. The risk increases with age. ?Stress. ?What are the signs or symptoms? ?High blood pressure may not cause symptoms. Very high blood pressure (hypertensive crisis) may cause: ?Headache. ?Fast or uneven heartbeats (palpitations). ?Shortness of breath. ?Nosebleed. ?Vomiting or feeling like you may vomit (nauseous). ?Changes in how you see. ?Very bad chest pain. ?Feeling dizzy. ?Seizures. ?How is this treated? ?This condition is treated by making healthy lifestyle changes, such as: ?Eating healthy foods. ?Exercising more. ?Drinking less alcohol. ?Your doctor may prescribe medicine if lifestyle changes do not help enough and if: ?Your top number is above 130. ?Your bottom number is above 80. ?Your personal target blood pressure may vary. ?Follow these instructions at home: ?Eating and drinking ? ?If told, follow the DASH eating plan. To follow this plan: ?Fill one half of your plate at each meal with fruits and vegetables. ?Fill one fourth of your plate  at each meal with whole grains. Whole grains include whole-wheat pasta, brown rice, and whole-grain bread. ?Eat or drink low-fat dairy products, such as skim milk or low-fat yogurt. ?Fill one fourth of your plate at each meal with low-fat (lean) proteins. Low-fat proteins include fish, chicken without skin, eggs, beans, and tofu. ?Avoid fatty meat, cured and processed meat, or chicken with skin. ?Avoid pre-made or processed food. ?Limit the amount of salt in your diet to less than 1,500 mg each day. ?Do not drink alcohol if: ?Your doctor tells you not to drink. ?You are pregnant, may be pregnant, or are planning to become pregnant. ?If you drink alcohol: ?Limit how much you have to: ?0-1 drink a day for women. ?0-2 drinks a day for men. ?Know how much alcohol is in your drink. In the [place] one drink equals one 12 oz bottle of beer (355 mL), one 5 oz glass of wine (148 mL), or one 1? oz glass of hard liquor (44 mL). ?Lifestyle ? ?Work with your doctor to stay at a healthy weight or to lose weight. Ask your doctor what the best weight is for you. ?Get at least 30 minutes of exercise that causes your heart to beat faster (aerobic exercise) most days of the week. This may include walking, swimming, or biking. ?Get at least 30 minutes of exercise that strengthens your muscles (resistance exercise) at least 3 days a week. This may include lifting weights or doing Pilates. ?Do not smoke or use any products that contain nicotine or tobacco. If you need help quitting, ask your doctor. ?Check your blood pressure at home as told by your doctor. ?Keep all follow-up visits. ?Medicines ?Take over-the-counter and prescription medicines   only as told by your doctor. Follow directions carefully. ?Do not skip doses of blood pressure medicine. The medicine does not work as well if you skip doses. Skipping doses also puts you at risk for problems. ?Ask your doctor about side effects or reactions to medicines that you should watch  for. ?Contact a doctor if: ?You think you are having a reaction to the medicine you are taking. ?You have headaches that keep coming back. ?You feel dizzy. ?You have swelling in your ankles. ?You have trouble with your vision. ?Get help right away if: ?You get a very bad headache. ?You start to feel mixed up (confused). ?You feel weak or numb. ?You feel faint. ?You have very bad pain in your: ?Chest. ?Belly (abdomen). ?You vomit more than once. ?You have trouble breathing. ?These symptoms may be an emergency. Get help right away. Call 911. ?Do not wait to see if the symptoms will go away. ?Do not drive yourself to the hospital. ?Summary ?Hypertension is another name for high blood pressure. ?High blood pressure forces your heart to work harder to pump blood. ?For most people, a normal blood pressure is less than 120/80. ?Making healthy choices can help lower blood pressure. If your blood pressure does not get lower with healthy choices, you may need to take medicine. ?This information is not intended to replace advice given to you by your health care provider. Make sure you discuss any questions you have with your health care provider. ?Document Revised: 07/10/2021 Document Reviewed: 07/10/2021 ?Elsevier Patient Education ? 2023 Elsevier Inc. ? ?

## 2022-06-19 ENCOUNTER — Ambulatory Visit (INDEPENDENT_AMBULATORY_CARE_PROVIDER_SITE_OTHER): Payer: BC Managed Care – PPO

## 2022-06-19 DIAGNOSIS — J309 Allergic rhinitis, unspecified: Secondary | ICD-10-CM | POA: Diagnosis not present

## 2022-07-01 ENCOUNTER — Ambulatory Visit: Payer: BC Managed Care – PPO | Admitting: Neurology

## 2022-07-01 ENCOUNTER — Encounter: Payer: Self-pay | Admitting: Neurology

## 2022-07-08 ENCOUNTER — Encounter (INDEPENDENT_AMBULATORY_CARE_PROVIDER_SITE_OTHER): Payer: BC Managed Care – PPO | Admitting: Internal Medicine

## 2022-07-15 ENCOUNTER — Ambulatory Visit (INDEPENDENT_AMBULATORY_CARE_PROVIDER_SITE_OTHER): Payer: BC Managed Care – PPO

## 2022-07-15 DIAGNOSIS — J4550 Severe persistent asthma, uncomplicated: Secondary | ICD-10-CM

## 2022-08-01 ENCOUNTER — Other Ambulatory Visit: Payer: Self-pay | Admitting: Allergy & Immunology

## 2022-08-03 DIAGNOSIS — Z3040 Encounter for surveillance of contraceptives, unspecified: Secondary | ICD-10-CM | POA: Diagnosis not present

## 2022-08-12 ENCOUNTER — Other Ambulatory Visit (HOSPITAL_COMMUNITY): Payer: Self-pay

## 2022-08-12 ENCOUNTER — Ambulatory Visit: Payer: BC Managed Care – PPO

## 2022-08-12 ENCOUNTER — Ambulatory Visit (INDEPENDENT_AMBULATORY_CARE_PROVIDER_SITE_OTHER): Payer: BC Managed Care – PPO

## 2022-08-12 ENCOUNTER — Telehealth: Payer: Self-pay | Admitting: *Deleted

## 2022-08-12 DIAGNOSIS — J309 Allergic rhinitis, unspecified: Secondary | ICD-10-CM | POA: Diagnosis not present

## 2022-08-12 MED ORDER — NUCALA 100 MG/ML ~~LOC~~ SOAJ
SUBCUTANEOUS | 11 refills | Status: DC
  Filled 2022-08-12: qty 1, fill #0
  Filled 2022-08-12: qty 1, 28d supply, fill #0
  Filled 2022-09-07 – 2022-09-10 (×3): qty 1, 28d supply, fill #1
  Filled 2022-10-06: qty 1, 28d supply, fill #2
  Filled 2022-10-29: qty 1, 28d supply, fill #3
  Filled 2022-12-01: qty 1, 28d supply, fill #4
  Filled 2022-12-30: qty 1, 28d supply, fill #5
  Filled 2023-01-26: qty 1, 28d supply, fill #6

## 2022-08-12 NOTE — Telephone Encounter (Signed)
Called [employer] and they advised Tracey Escobar and sent Rx for Nucala to [facility] and called and advised patient of change of pharmacy

## 2022-08-14 ENCOUNTER — Other Ambulatory Visit (HOSPITAL_COMMUNITY): Payer: Self-pay

## 2022-08-17 ENCOUNTER — Other Ambulatory Visit (HOSPITAL_COMMUNITY): Payer: Self-pay

## 2022-08-19 ENCOUNTER — Ambulatory Visit (INDEPENDENT_AMBULATORY_CARE_PROVIDER_SITE_OTHER): Payer: BC Managed Care – PPO

## 2022-08-19 DIAGNOSIS — J4550 Severe persistent asthma, uncomplicated: Secondary | ICD-10-CM | POA: Diagnosis not present

## 2022-09-04 ENCOUNTER — Other Ambulatory Visit (HOSPITAL_COMMUNITY): Payer: Self-pay

## 2022-09-07 ENCOUNTER — Other Ambulatory Visit (HOSPITAL_COMMUNITY): Payer: Self-pay

## 2022-09-08 ENCOUNTER — Other Ambulatory Visit (HOSPITAL_COMMUNITY): Payer: Self-pay

## 2022-09-08 DIAGNOSIS — M545 Low back pain, unspecified: Secondary | ICD-10-CM | POA: Diagnosis not present

## 2022-09-08 DIAGNOSIS — M25551 Pain in right hip: Secondary | ICD-10-CM | POA: Diagnosis not present

## 2022-09-08 DIAGNOSIS — M25552 Pain in left hip: Secondary | ICD-10-CM | POA: Diagnosis not present

## 2022-09-08 DIAGNOSIS — M25561 Pain in right knee: Secondary | ICD-10-CM | POA: Diagnosis not present

## 2022-09-09 ENCOUNTER — Other Ambulatory Visit (HOSPITAL_COMMUNITY): Payer: Self-pay

## 2022-09-10 ENCOUNTER — Other Ambulatory Visit (HOSPITAL_COMMUNITY): Payer: Self-pay

## 2022-09-11 ENCOUNTER — Other Ambulatory Visit (HOSPITAL_COMMUNITY): Payer: Self-pay

## 2022-09-16 ENCOUNTER — Ambulatory Visit (INDEPENDENT_AMBULATORY_CARE_PROVIDER_SITE_OTHER): Payer: BC Managed Care – PPO

## 2022-09-16 DIAGNOSIS — J4550 Severe persistent asthma, uncomplicated: Secondary | ICD-10-CM

## 2022-10-01 ENCOUNTER — Other Ambulatory Visit (HOSPITAL_COMMUNITY): Payer: Self-pay

## 2022-10-06 ENCOUNTER — Other Ambulatory Visit (HOSPITAL_COMMUNITY): Payer: Self-pay

## 2022-10-08 DIAGNOSIS — G4733 Obstructive sleep apnea (adult) (pediatric): Secondary | ICD-10-CM | POA: Diagnosis not present

## 2022-10-09 ENCOUNTER — Other Ambulatory Visit: Payer: Self-pay

## 2022-10-12 ENCOUNTER — Other Ambulatory Visit: Payer: Self-pay

## 2022-10-14 ENCOUNTER — Ambulatory Visit (INDEPENDENT_AMBULATORY_CARE_PROVIDER_SITE_OTHER): Payer: BC Managed Care – PPO

## 2022-10-14 DIAGNOSIS — J4550 Severe persistent asthma, uncomplicated: Secondary | ICD-10-CM

## 2022-10-16 ENCOUNTER — Ambulatory Visit (INDEPENDENT_AMBULATORY_CARE_PROVIDER_SITE_OTHER): Payer: BC Managed Care – PPO

## 2022-10-16 DIAGNOSIS — J309 Allergic rhinitis, unspecified: Secondary | ICD-10-CM

## 2022-10-19 ENCOUNTER — Other Ambulatory Visit: Payer: Self-pay | Admitting: Internal Medicine

## 2022-10-21 DIAGNOSIS — Z3042 Encounter for surveillance of injectable contraceptive: Secondary | ICD-10-CM | POA: Diagnosis not present

## 2022-10-29 ENCOUNTER — Other Ambulatory Visit (HOSPITAL_COMMUNITY): Payer: Self-pay

## 2022-10-30 ENCOUNTER — Ambulatory Visit (INDEPENDENT_AMBULATORY_CARE_PROVIDER_SITE_OTHER): Payer: BC Managed Care – PPO

## 2022-10-30 DIAGNOSIS — J309 Allergic rhinitis, unspecified: Secondary | ICD-10-CM

## 2022-11-03 ENCOUNTER — Other Ambulatory Visit (HOSPITAL_COMMUNITY): Payer: Self-pay

## 2022-11-06 ENCOUNTER — Ambulatory Visit (INDEPENDENT_AMBULATORY_CARE_PROVIDER_SITE_OTHER): Payer: BC Managed Care – PPO

## 2022-11-06 ENCOUNTER — Ambulatory Visit: Payer: Self-pay

## 2022-11-06 DIAGNOSIS — J309 Allergic rhinitis, unspecified: Secondary | ICD-10-CM

## 2022-11-07 ENCOUNTER — Other Ambulatory Visit: Payer: Self-pay | Admitting: Allergy & Immunology

## 2022-11-08 DIAGNOSIS — G4733 Obstructive sleep apnea (adult) (pediatric): Secondary | ICD-10-CM | POA: Diagnosis not present

## 2022-11-11 ENCOUNTER — Other Ambulatory Visit: Payer: Self-pay | Admitting: Allergy & Immunology

## 2022-11-11 ENCOUNTER — Ambulatory Visit (INDEPENDENT_AMBULATORY_CARE_PROVIDER_SITE_OTHER): Payer: BC Managed Care – PPO

## 2022-11-11 DIAGNOSIS — J4550 Severe persistent asthma, uncomplicated: Secondary | ICD-10-CM

## 2022-11-12 ENCOUNTER — Encounter: Payer: Self-pay | Admitting: Internal Medicine

## 2022-11-12 ENCOUNTER — Ambulatory Visit: Payer: BC Managed Care – PPO | Admitting: Internal Medicine

## 2022-11-12 VITALS — BP 118/72 | HR 95 | Temp 98.3°F | Ht 67.0 in | Wt 310.6 lb

## 2022-11-12 DIAGNOSIS — K5909 Other constipation: Secondary | ICD-10-CM | POA: Diagnosis not present

## 2022-11-12 DIAGNOSIS — G4733 Obstructive sleep apnea (adult) (pediatric): Secondary | ICD-10-CM | POA: Diagnosis not present

## 2022-11-12 DIAGNOSIS — E559 Vitamin D deficiency, unspecified: Secondary | ICD-10-CM

## 2022-11-12 DIAGNOSIS — J4540 Moderate persistent asthma, uncomplicated: Secondary | ICD-10-CM

## 2022-11-12 DIAGNOSIS — Z6841 Body Mass Index (BMI) 40.0 and over, adult: Secondary | ICD-10-CM

## 2022-11-12 DIAGNOSIS — I119 Hypertensive heart disease without heart failure: Secondary | ICD-10-CM

## 2022-11-12 DIAGNOSIS — Z23 Encounter for immunization: Secondary | ICD-10-CM | POA: Diagnosis not present

## 2022-11-12 DIAGNOSIS — I480 Paroxysmal atrial fibrillation: Secondary | ICD-10-CM

## 2022-11-12 NOTE — Progress Notes (Signed)
I,[name],acting as a scribe for Maximino Greenland, MD.,have documented all relevant documentation on the behalf of Maximino Greenland, MD,as directed by  Maximino Greenland, MD while in the presence of Maximino Greenland, MD.    Subjective:     Patient ID: Tracey Escobar , female    DOB: 09-11-1971 , 52 y.o.   MRN: [id number]   Chief Complaint  Patient presents with   Hypertension    HPI  Pt here today for b/p check.  She reports compliance with meds. Denies headaches, chest pain and palpitations. She admits she is not exercising as she should.   Dr Cletis Media: GYN. Patient completes pap & mammogram at their office. She does have an upcoming appt with them soon.   Hypertension This is a chronic problem. The current episode started more than 1 year ago. The problem has been gradually improving since onset. The problem is controlled. Pertinent negatives include no blurred vision, chest pain, headaches, palpitations or shortness of breath. Risk factors for coronary artery disease include obesity. Past treatments include lifestyle changes, angiotensin blockers and diuretics. The current treatment provides moderate improvement.     Past Medical History:  Diagnosis Date   Arthritis    Asthma    Atrial fibrillation (HCC)    Dysrhythmia    A-Fib   GERD (gastroesophageal reflux disease)    Hypertension    Irritable bowel    Pneumonia    Sleep apnea    Sleep apnea    Vitamin D deficiency      Family History  Problem Relation Age of Onset   Hyperlipidemia Mother    Hypertension Mother    Hyperlipidemia Father    Hypertension Father    COPD Father    Heart failure Father    Diabetes Brother    Hypertension Maternal Grandmother    Seizures Maternal Grandmother    Cancer Paternal Grandfather    Cancer Brother    Allergic rhinitis Neg Hx    Angioedema Neg Hx    Asthma Neg Hx    Eczema Neg Hx    Immunodeficiency Neg Hx    Urticaria Neg Hx      Current Outpatient  Medications:    Bepotastine Besilate 1.5 % SOLN, INSTILL 1 DROP IN BOTH EYES DAILY AS NEEDED, Disp: 10 mL, Rfl: 5   CALCIUM PO, Take 1 tablet by mouth daily at 2 am., Disp: , Rfl:    Doxepin HCl 6 MG TABS, Take 1 tablet (6 mg total) by mouth at bedtime as needed., Disp: 30 tablet, Rfl: 1   EPINEPHRINE 0.3 mg/0.3 mL IJ SOAJ injection, ADMINISTER 0.3 MG IN THE MUSCLE 1 TIME FOR 1 DOSE, Disp: 2 each, Rfl: 1   Estradiol 10 MCG TABS vaginal tablet, estradiol 10 mcg vaginal tablet, Disp: , Rfl:    flecainide (TAMBOCOR) 100 MG tablet, Take by mouth., Disp: , Rfl:    Fluticasone-Umeclidin-Vilant (TRELEGY ELLIPTA) 200-62.5-25 MCG/ACT AEPB, Inhale 1 puff into the lungs daily., Disp: 90 each, Rfl: 2   hydrochlorothiazide (HYDRODIURIL) 12.5 MG tablet, TAKE 1 TABLET(12.5 MG) BY MOUTH DAILY, Disp: 90 tablet, Rfl: 1   Lemborexant (DAYVIGO) 10 MG TABS, Take 10 mg by mouth at bedtime as needed., Disp: 30 tablet, Rfl: 2   levocetirizine (XYZAL) 5 MG tablet, TAKE 1 TABLET BY MOUTH EVERY DAY IN THE EVENING, Disp: 90 tablet, Rfl: 1   losartan (COZAAR) 25 MG tablet, TAKE 1 TABLET(25 MG) BY MOUTH DAILY, Disp: 90 tablet, Rfl: 1  medroxyPROGESTERone (DEPO-PROVERA) 150 MG/ML injection, medroxyprogesterone 150 mg/mL intramuscular suspension  Inject 1 mL every 3 months by intramuscular route around the clock., Disp: , Rfl:    Mepolizumab (NUCALA) 100 MG/ML SOAJ, INJECT THE CONTENTS OF 1 PEN (100 MG) UNDER THE SKIN EVERY 4 WEEKS, Disp: 1 mL, Rfl: 11   metoprolol succinate (TOPROL-XL) 100 MG 24 hr tablet, Take 100 mg by mouth daily. , Disp: , Rfl:    montelukast (SINGULAIR) 10 MG tablet, TAKE 1 TABLET(10 MG) BY MOUTH AT BEDTIME, Disp: 90 tablet, Rfl: 0   pantoprazole (PROTONIX) 40 MG tablet, TAKE 1 TABLET(40 MG) BY MOUTH DAILY, Disp: 90 tablet, Rfl: 1   RESTASIS 0.05 % ophthalmic emulsion, INSTILL 1 DROP IN BOTH EYES TWICE DAILY, Disp: 60 each, Rfl: 0   rivaroxaban (XARELTO) 20 MG TABS tablet, TAKE 1 TABLET(20 MG) BY MOUTH  WITH SUPPER, Disp: , Rfl:   Current Facility-Administered Medications:    cyanocobalamin ((VITAMIN B-12)) injection 1,000 mcg, 1,000 mcg, Intramuscular, Once, Ghumman, Ramandeep, NP   Mepolizumab SOLR 100 mg, 100 mg, Subcutaneous, Q28 days, Rexene Alberts M, DO, 100 mg at 11/11/22 0913   No Known Allergies   Review of Systems  Constitutional: Negative.   Eyes:  Negative for blurred vision.  Respiratory: Negative.  Negative for shortness of breath.   Cardiovascular: Negative.  Negative for chest pain and palpitations.  Gastrointestinal:  Positive for constipation.  Neurological: Negative.  Negative for headaches.  Psychiatric/Behavioral: Negative.       Today's Vitals   11/12/22 1548  BP: 118/72  Pulse: 95  Temp: 98.3 F (36.8 C)  SpO2: 98%  Weight: (!) 310 lb 9.6 oz (140.9 kg)  Height: 5' 7"$  (1.702 m)   Body mass index is 48.65 kg/m.  Wt Readings from Last 3 Encounters:  11/12/22 (!) 310 lb 9.6 oz (140.9 kg)  06/02/22 (!) 308 lb (139.7 kg)  02/10/22 (!) 306 lb 9.6 oz (139.1 kg)    Objective:  Physical Exam Vitals and nursing note reviewed.  Constitutional:      Appearance: Normal appearance. She is obese.  HENT:     Head: Normocephalic and atraumatic.     Nose:     Comments: Masked     Mouth/Throat:     Comments: Masked  Eyes:     Extraocular Movements: Extraocular movements intact.  Cardiovascular:     Rate and Rhythm: Normal rate and regular rhythm.     Heart sounds: Normal heart sounds.  Pulmonary:     Effort: Pulmonary effort is normal.     Breath sounds: Normal breath sounds.  Musculoskeletal:     Cervical back: Normal range of motion.  Skin:    General: Skin is warm.  Neurological:     General: No focal deficit present.     Mental Status: She is alert.  Psychiatric:        Mood and Affect: Mood normal.        Behavior: Behavior normal.      Assessment And Plan:     1. Hypertensive heart disease without heart failure Comments: Chronic, well  controlled. She will c/w HCTZ 12.50m and losartan 211mdaily. She is encouraged to follow low sodium diet. She will f/u w/in six months for next CPE.  - CMP14+EGFR - Amb Referral To Provider Referral Exercise Program (P.R.E.P)  2. Paroxysmal atrial fibrillation (HCC) Comments: Chronic, currently in sinus rhythm. [facility] She will c/w flecainide, metoprolol and Xarelto. Most recent Cardio note reviewed  in detail.   3. Chronic constipation Comments: Chronic, unfortunately her insurance does not cover Linzess which she finds most effective. She has been seen by GI, currently on stool softener. Advised to take Trulance (covered by her insurance) w/ stool softener and she may have better results.   4. OSA on CPAP Comments: Chronic, encouraged to wear CPAP nightly for at least four hours.  5. Vitamin D deficiency disease Comments: I will check vitamin D level and supplement as needed. - Vitamin D (25 hydroxy)  6. Class 3 severe obesity due to excess calories with serious comorbidity and body mass index (BMI) of 45.0 to 49.9 in adult [facility]) Comments: BMI 48. She is agreeable to PREP program. Encouraged to aim for 150 minutes of exercise/week, while initially striving for BMI<40 to decrease cardiac risk. - Amb Referral To Provider Referral Exercise Program (P.R.E.P)  7. Need for influenza vaccination - Flu Vaccine QUAD 6+ mos PF IM (Fluarix Quad PF)  Patient was given opportunity to ask questions. Patient verbalized understanding of the plan and was able to repeat key elements of the plan. All questions were answered to their satisfaction.   I, Maximino Greenland, MD, have reviewed all documentation for this visit. The documentation on 11/12/22 for the exam, diagnosis, procedures, and orders are all accurate and complete.   IF YOU HAVE BEEN REFERRED TO A SPECIALIST, IT MAY TAKE 1-2 WEEKS TO SCHEDULE/PROCESS THE REFERRAL. IF YOU HAVE NOT HEARD FROM US/SPECIALIST IN TWO  WEEKS, PLEASE GIVE [place] A CALL AT [phone] X [phone].   THE PATIENT IS ENCOURAGED TO PRACTICE SOCIAL DISTANCING DUE TO THE COVID-19 PANDEMIC.

## 2022-11-12 NOTE — Patient Instructions (Signed)
Hypertension, Adult Hypertension is another name for high blood pressure. High blood pressure forces your heart to work harder to pump blood. This can cause problems over time. There are two numbers in a blood pressure reading. There is a top number (systolic) over a bottom number (diastolic). It is best to have a blood pressure that is below 120/80. What are the causes? The cause of this condition is not known. Some other conditions can lead to high blood pressure. What increases the risk? Some lifestyle factors can make you more likely to develop high blood pressure: Smoking. Not getting enough exercise or physical activity. Being overweight. Having too much fat, sugar, calories, or salt (sodium) in your diet. Drinking too much alcohol. Other risk factors include: Having any of these conditions: Heart disease. Diabetes. High cholesterol. Kidney disease. Obstructive sleep apnea. Having a family history of high blood pressure and high cholesterol. Age. The risk increases with age. Stress. What are the signs or symptoms? High blood pressure may not cause symptoms. Very high blood pressure (hypertensive crisis) may cause: Headache. Fast or uneven heartbeats (palpitations). Shortness of breath. Nosebleed. Vomiting or feeling like you may vomit (nauseous). Changes in how you see. Very bad chest pain. Feeling dizzy. Seizures. How is this treated? This condition is treated by making healthy lifestyle changes, such as: Eating healthy foods. Exercising more. Drinking less alcohol. Your doctor may prescribe medicine if lifestyle changes do not help enough and if: Your top number is above 130. Your bottom number is above 80. Your personal target blood pressure may vary. Follow these instructions at home: Eating and drinking  If told, follow the DASH eating plan. To follow this plan: Fill one half of your plate at each meal with fruits and vegetables. Fill one fourth of your plate  at each meal with whole grains. Whole grains include whole-wheat pasta, brown rice, and whole-grain bread. Eat or drink low-fat dairy products, such as skim milk or low-fat yogurt. Fill one fourth of your plate at each meal with low-fat (lean) proteins. Low-fat proteins include fish, chicken without skin, eggs, beans, and tofu. Avoid fatty meat, cured and processed meat, or chicken with skin. Avoid pre-made or processed food. Limit the amount of salt in your diet to less than 1,500 mg each day. Do not drink alcohol if: Your doctor tells you not to drink. You are pregnant, may be pregnant, or are planning to become pregnant. If you drink alcohol: Limit how much you have to: 0-1 drink a day for women. 0-2 drinks a day for men. Know how much alcohol is in your drink. In the [place] one drink equals one 12 oz bottle of beer (355 mL), one 5 oz glass of wine (148 mL), or one 1 oz glass of hard liquor (44 mL). Lifestyle  Work with your doctor to stay at a healthy weight or to lose weight. Ask your doctor what the best weight is for you. Get at least 30 minutes of exercise that causes your heart to beat faster (aerobic exercise) most days of the week. This may include walking, swimming, or biking. Get at least 30 minutes of exercise that strengthens your muscles (resistance exercise) at least 3 days a week. This may include lifting weights or doing Pilates. Do not smoke or use any products that contain nicotine or tobacco. If you need help quitting, ask your doctor. Check your blood pressure at home as told by your doctor. Keep all follow-up visits. Medicines Take over-the-counter and prescription medicines  only as told by your doctor. Follow directions carefully. ?Do not skip doses of blood pressure medicine. The medicine does not work as well if you skip doses. Skipping doses also puts you at risk for problems. ?Ask your doctor about side effects or reactions to medicines that you should watch  for. ?Contact a doctor if: ?You think you are having a reaction to the medicine you are taking. ?You have headaches that keep coming back. ?You feel dizzy. ?You have swelling in your ankles. ?You have trouble with your vision. ?Get help right away if: ?You get a very bad headache. ?You start to feel mixed up (confused). ?You feel weak or numb. ?You feel faint. ?You have very bad pain in your: ?Chest. ?Belly (abdomen). ?You vomit more than once. ?You have trouble breathing. ?These symptoms may be an emergency. Get help right away. Call 911. ?Do not wait to see if the symptoms will go away. ?Do not drive yourself to the hospital. ?Summary ?Hypertension is another name for high blood pressure. ?High blood pressure forces your heart to work harder to pump blood. ?For most people, a normal blood pressure is less than 120/80. ?Making healthy choices can help lower blood pressure. If your blood pressure does not get lower with healthy choices, you may need to take medicine. ?This information is not intended to replace advice given to you by your health care provider. Make sure you discuss any questions you have with your health care provider. ?Document Revised: 07/10/2021 Document Reviewed: 07/10/2021 ?Elsevier Patient Education ? 2023 Elsevier Inc. ? ?

## 2022-11-13 ENCOUNTER — Ambulatory Visit (INDEPENDENT_AMBULATORY_CARE_PROVIDER_SITE_OTHER): Payer: BC Managed Care – PPO

## 2022-11-13 DIAGNOSIS — J309 Allergic rhinitis, unspecified: Secondary | ICD-10-CM

## 2022-11-13 LAB — CMP14+EGFR
ALT: 13 IU/L (ref 0–32)
AST: 12 IU/L (ref 0–40)
Albumin/Globulin Ratio: 1.4 (ref 1.2–2.2)
Albumin: 4.4 g/dL (ref 3.8–4.9)
Alkaline Phosphatase: 114 IU/L (ref 44–121)
BUN/Creatinine Ratio: 14 (ref 9–23)
BUN: 12 mg/dL (ref 6–24)
Bilirubin Total: 0.3 mg/dL (ref 0.0–1.2)
CO2: 23 mmol/L (ref 20–29)
Calcium: 10.8 mg/dL — ABNORMAL HIGH (ref 8.7–10.2)
Chloride: 103 mmol/L (ref 96–106)
Creatinine, Ser: 0.88 mg/dL (ref 0.57–1.00)
Globulin, Total: 3.1 g/dL (ref 1.5–4.5)
Glucose: 91 mg/dL (ref 70–99)
Potassium: 4.6 mmol/L (ref 3.5–5.2)
Sodium: 141 mmol/L (ref 134–144)
Total Protein: 7.5 g/dL (ref 6.0–8.5)
eGFR: 79 mL/min/{1.73_m2} (ref >59)

## 2022-11-13 LAB — VITAMIN D 25 HYDROXY (VIT D DEFICIENCY, FRACTURES): Vit D, 25-Hydroxy: 40.7 ng/mL (ref 30.0–100.0)

## 2022-11-15 ENCOUNTER — Other Ambulatory Visit: Payer: Self-pay | Admitting: Allergy & Immunology

## 2022-11-16 ENCOUNTER — Encounter (INDEPENDENT_AMBULATORY_CARE_PROVIDER_SITE_OTHER): Payer: Self-pay | Admitting: Family Medicine

## 2022-11-16 ENCOUNTER — Ambulatory Visit (INDEPENDENT_AMBULATORY_CARE_PROVIDER_SITE_OTHER): Payer: BC Managed Care – PPO | Admitting: Family Medicine

## 2022-11-16 VITALS — BP 140/83 | HR 88 | Temp 97.7°F | Ht 67.0 in | Wt 304.0 lb

## 2022-11-16 DIAGNOSIS — Z6841 Body Mass Index (BMI) 40.0 and over, adult: Secondary | ICD-10-CM

## 2022-11-16 DIAGNOSIS — G4733 Obstructive sleep apnea (adult) (pediatric): Secondary | ICD-10-CM | POA: Diagnosis not present

## 2022-11-16 DIAGNOSIS — I480 Paroxysmal atrial fibrillation: Secondary | ICD-10-CM | POA: Diagnosis not present

## 2022-11-16 DIAGNOSIS — I119 Hypertensive heart disease without heart failure: Secondary | ICD-10-CM | POA: Diagnosis not present

## 2022-11-16 DIAGNOSIS — Z0289 Encounter for other administrative examinations: Secondary | ICD-10-CM

## 2022-11-16 NOTE — Assessment & Plan Note (Signed)
Denies many heart palpitations Triggered by stress Limits some caffeine intake Avoid use of phentermine

## 2022-11-16 NOTE — Assessment & Plan Note (Signed)
BP is borderline high today Takes all anti hypertensive meds Taking Tylenol Sinus for a viral URI

## 2022-11-16 NOTE — Assessment & Plan Note (Signed)
Not liking to wear CPAP at night and hopes to see improvements with weight loss Gets only 4 hrs of good sleep at night, chronic.

## 2022-11-16 NOTE — Progress Notes (Signed)
Office: [phone]  /  Fax: [phone]   Initial Visit  Tracey Escobar was seen in clinic today to evaluate for obesity. She is interested in losing weight to improve overall health and reduce the risk of weight related complications. She presents today to review program treatment options, initial physical assessment, and evaluation.     She was referred by: PCP  When asked what else they would like to accomplish? She states: Adopt healthier eating patterns, Improve energy levels and physical activity, Improve existing medical conditions, and Improve quality of life  When asked how has your weight affected you? She states: Contributed to medical problems, Contributed to orthopedic problems or mobility issues, and Having fatigue  Some associated conditions: Hypertension, OSA, and Other:    Contributing factors: Family history, Stress, Reduced physical activity, Eating patterns, and Menopause  Weight promoting medications identified: None  Current nutrition plan: None and Other: doesn't eat many veggies  and drinks a lot of SSBs and eats out a lot  Current level of physical activity: Walking  Current or previous pharmacotherapy: GLP-1 and Phentermine  Response to medication: Other: had to stop Phentermine with A Fib  and insurance didn't cover Ozempic  Past medical history includes:   Past Medical History:  Diagnosis Date   Arthritis    Asthma    Atrial fibrillation ([place])    Dysrhythmia    A-Fib   GERD (gastroesophageal reflux disease)    Hypertension    Irritable bowel    Pneumonia    Sleep apnea    Sleep apnea    Vitamin D deficiency      Objective:   BP (!) 140/83   Pulse 88   Temp 97.7 F (36.5 C)   Ht 5' 7"$  (1.702 m)   Wt (!) 304 lb (137.9 kg)   SpO2 98%   BMI 47.61 kg/m  She was weighed on the bioimpedance scale: Body mass index is 47.61 kg/m.   General:  Alert, oriented and cooperative. Patient is in no acute distress.  Respiratory: Normal  respiratory effort, no problems with respiration noted  Extremities: Normal range of motion.    Mental Status: Normal mood and affect. Normal behavior. Normal judgment and thought content.   DIAGNOSTIC DATA REVIEWED:  BMET    Component Value Date/Time   NA 141 11/12/2022 1636   K 4.6 11/12/2022 1636   CL 103 11/12/2022 1636   CO2 23 11/12/2022 1636   GLUCOSE 91 11/12/2022 1636   GLUCOSE 86 02/25/2016 0710   BUN 12 11/12/2022 1636   CREATININE 0.88 11/12/2022 1636   CALCIUM 10.8 (H) 11/12/2022 1636   GFRNONAA 88 10/30/2020 1219   GFRAA 101 10/30/2020 1219   Lab Results  Component Value Date   HGBA1C 5.4 12/16/2021   HGBA1C 5.7 (H) 11/16/2018   Lab Results  Component Value Date   INSULIN 22.7 11/25/2021   INSULIN 24.3 12/05/2019   CBC    Component Value Date/Time   WBC 5.0 12/16/2021 0923   WBC 5.9 09/27/2018 1059   RBC 4.88 12/16/2021 0923   RBC 4.88 09/27/2018 1059   HGB 13.6 12/16/2021 0923   HCT 41.6 12/16/2021 0923   PLT 339 12/16/2021 0923   MCV 85 12/16/2021 0923   MCH 27.9 12/16/2021 0923   MCH 27.0 09/27/2018 1059   MCHC 32.7 12/16/2021 0923   MCHC 30.9 09/27/2018 1059   RDW 13.2 12/16/2021 0923   Iron/TIBC/Ferritin/ %Sat    Component Value Date/Time   IRON 54 08/09/2018  [id number]   TIBC 272 08/09/2018 0927   FERRITIN 74 08/09/2018 0927   IRONPCTSAT 20 08/09/2018 0927   Lipid Panel     Component Value Date/Time   CHOL 138 12/16/2021 0923   TRIG 80 12/16/2021 0923   HDL 48 12/16/2021 0923   CHOLHDL 2.9 12/16/2021 0923   LDLCALC 74 12/16/2021 0923   Hepatic Function Panel     Component Value Date/Time   PROT 7.5 11/12/2022 1636   ALBUMIN 4.4 11/12/2022 1636   Escobar 12 11/12/2022 1636   ALT 13 11/12/2022 1636   ALKPHOS 114 11/12/2022 1636   BILITOT 0.3 11/12/2022 1636      Component Value Date/Time   TSH 2.110 11/25/2021 0902     Assessment and Plan:   Hypertensive heart disease without heart failure Assessment & Plan: BP is borderline  high today Takes all anti hypertensive meds Taking Tylenol Sinus for a viral URI   Morbid obesity (HCC)  Paroxysmal atrial fibrillation (HCC) Assessment & Plan: Denies many heart palpitations Triggered by stress Limits some caffeine intake Avoid use of phentermine   BMI 45.0-49.9, adult (HCC)  OSA (obstructive sleep apnea) Assessment & Plan: Not liking to wear CPAP at night and hopes to see improvements with weight loss Gets only 4 hrs of good sleep at night, chronic.         Obesity Treatment / Action Plan:  Patient will work on garnering support from family and friends to begin weight loss journey. Will work on eliminating or reducing the presence of highly palatable, calorie dense foods in the home. Will complete provided nutritional and psychosocial assessment questionnaire before the next appointment. Will be scheduled for indirect calorimetry to determine resting energy expenditure in a fasting state.  This will allow [place] to create a reduced calorie, high-protein meal plan to promote loss of fat mass while preserving muscle mass. Will think about ideas on how to incorporate physical activity into their daily routine. Will reduce liquid calories and sugary drinks from diet. Was counseled on nutritional approaches to weight loss and benefits of complex carbs and high quality protein as part of nutritional weight management. Was counseled on pharmacotherapy and role as an adjunct in weight management.   Obesity Education Performed Today:  She was weighed on the bioimpedance scale and results were discussed and documented in the synopsis.  We discussed obesity as a disease and the importance of a more detailed evaluation of all the factors contributing to the disease.  We discussed the importance of long term lifestyle changes which include nutrition, exercise and behavioral modifications as well as the importance of customizing this to her specific health and social  needs.  We discussed the benefits of reaching a healthier weight to alleviate the symptoms of existing conditions and reduce the risks of the biomechanical, metabolic and psychological effects of obesity.  Tracey Escobar appears to be in the action stage of change and states they are ready to start intensive lifestyle modifications and behavioral modifications.  30 minutes was spent today on this visit including the above counseling, pre-visit chart review, and post-visit documentation.  Reviewed by clinician on day of visit: allergies, medications, problem list, medical history, surgical history, family history, social history, and previous encounter notes.    Loyal Gambler, DO

## 2022-11-17 ENCOUNTER — Encounter: Payer: Self-pay | Admitting: Internal Medicine

## 2022-11-20 ENCOUNTER — Telehealth: Payer: Self-pay

## 2022-11-20 NOTE — Telephone Encounter (Signed)
Called [phone] program referral, left voicemail

## 2022-11-22 ENCOUNTER — Other Ambulatory Visit: Payer: Self-pay | Admitting: Allergy & Immunology

## 2022-11-23 ENCOUNTER — Telehealth: Payer: Self-pay

## 2022-11-23 NOTE — Telephone Encounter (Signed)
Returned her call,explained [employer]; works in [place], can come to [place] M/W 4pm class which will be starting in April; will contact her end of March to confirm start date and set up assessment visit.

## 2022-11-25 ENCOUNTER — Telehealth: Payer: Self-pay

## 2022-11-25 NOTE — Telephone Encounter (Signed)
PA request received via CMM for Azelastine-Fluticasone 137-50MCG/ACT suspension  PA has been submitted to [facility] and is pending determination.  Key: Tracey Escobar

## 2022-11-26 ENCOUNTER — Ambulatory Visit (INDEPENDENT_AMBULATORY_CARE_PROVIDER_SITE_OTHER): Payer: BC Managed Care – PPO

## 2022-11-26 DIAGNOSIS — J309 Allergic rhinitis, unspecified: Secondary | ICD-10-CM

## 2022-11-26 DIAGNOSIS — M654 Radial styloid tenosynovitis [de Quervain]: Secondary | ICD-10-CM | POA: Diagnosis not present

## 2022-11-26 NOTE — Telephone Encounter (Signed)
PA has been APPROVED from 11/25/2022 through 11/24/2023

## 2022-11-30 ENCOUNTER — Other Ambulatory Visit (HOSPITAL_COMMUNITY): Payer: Self-pay

## 2022-12-01 ENCOUNTER — Other Ambulatory Visit (HOSPITAL_COMMUNITY): Payer: Self-pay

## 2022-12-02 ENCOUNTER — Other Ambulatory Visit (HOSPITAL_COMMUNITY): Payer: Self-pay

## 2022-12-03 ENCOUNTER — Ambulatory Visit: Payer: BC Managed Care – PPO | Admitting: Allergy & Immunology

## 2022-12-03 ENCOUNTER — Ambulatory Visit (INDEPENDENT_AMBULATORY_CARE_PROVIDER_SITE_OTHER): Payer: BC Managed Care – PPO

## 2022-12-03 DIAGNOSIS — J309 Allergic rhinitis, unspecified: Secondary | ICD-10-CM | POA: Diagnosis not present

## 2022-12-07 ENCOUNTER — Other Ambulatory Visit: Payer: Self-pay | Admitting: Allergy & Immunology

## 2022-12-07 DIAGNOSIS — G4733 Obstructive sleep apnea (adult) (pediatric): Secondary | ICD-10-CM | POA: Diagnosis not present

## 2022-12-07 DIAGNOSIS — J3089 Other allergic rhinitis: Secondary | ICD-10-CM | POA: Diagnosis not present

## 2022-12-07 NOTE — Progress Notes (Signed)
VIALS EXP 12-07-23

## 2022-12-08 ENCOUNTER — Encounter: Payer: Self-pay | Admitting: Pharmacist

## 2022-12-09 ENCOUNTER — Ambulatory Visit (INDEPENDENT_AMBULATORY_CARE_PROVIDER_SITE_OTHER): Payer: BC Managed Care – PPO

## 2022-12-09 ENCOUNTER — Ambulatory Visit: Payer: BC Managed Care – PPO

## 2022-12-09 DIAGNOSIS — J4550 Severe persistent asthma, uncomplicated: Secondary | ICD-10-CM | POA: Diagnosis not present

## 2022-12-15 DIAGNOSIS — Z6841 Body Mass Index (BMI) 40.0 and over, adult: Secondary | ICD-10-CM | POA: Diagnosis not present

## 2022-12-15 DIAGNOSIS — M25562 Pain in left knee: Secondary | ICD-10-CM | POA: Diagnosis not present

## 2022-12-16 ENCOUNTER — Ambulatory Visit (INDEPENDENT_AMBULATORY_CARE_PROVIDER_SITE_OTHER): Payer: 59 | Admitting: Family Medicine

## 2022-12-17 ENCOUNTER — Encounter: Payer: BC Managed Care – PPO | Admitting: Internal Medicine

## 2022-12-18 ENCOUNTER — Telehealth: Payer: Self-pay

## 2022-12-18 ENCOUNTER — Ambulatory Visit (INDEPENDENT_AMBULATORY_CARE_PROVIDER_SITE_OTHER): Payer: BC Managed Care – PPO

## 2022-12-18 DIAGNOSIS — J309 Allergic rhinitis, unspecified: Secondary | ICD-10-CM | POA: Diagnosis not present

## 2022-12-18 NOTE — Telephone Encounter (Signed)
Patient came in today for an allergy injection and has had to restart her Blue vial several times. She is currently on Schedule A, can she be switched to Schedule B in hopes she will be able to reach the end of her vials?

## 2022-12-18 NOTE — Telephone Encounter (Signed)
I will let her know and see if RUSH immunotherapy is an option for her.

## 2022-12-18 NOTE — Telephone Encounter (Signed)
Would rush immunotherapy be better for her? We can switch to B, no problem!   Salvatore Marvel, MD Allergy and [place] [facility] [place]

## 2022-12-22 ENCOUNTER — Ambulatory Visit (INDEPENDENT_AMBULATORY_CARE_PROVIDER_SITE_OTHER): Payer: BC Managed Care – PPO

## 2022-12-22 DIAGNOSIS — J309 Allergic rhinitis, unspecified: Secondary | ICD-10-CM | POA: Diagnosis not present

## 2022-12-23 ENCOUNTER — Ambulatory Visit (INDEPENDENT_AMBULATORY_CARE_PROVIDER_SITE_OTHER): Payer: 59 | Admitting: Family Medicine

## 2022-12-23 ENCOUNTER — Other Ambulatory Visit: Payer: Self-pay | Admitting: Allergy & Immunology

## 2022-12-23 DIAGNOSIS — J3089 Other allergic rhinitis: Secondary | ICD-10-CM

## 2022-12-23 NOTE — Progress Notes (Unsigned)
Allergen script written for Rush Immunotherapy.   Salvatore Marvel, MD Allergy and [place] [facility] [place]

## 2022-12-23 NOTE — Telephone Encounter (Signed)
Patient checked with her insurance company which approved the rush. She has been on allergy injections before. She is currently on the blue vial, but would like to do the rush to get her up to the green vial. Will give Tracey Escobar patient's information to call patient and schedule.

## 2022-12-24 NOTE — Progress Notes (Unsigned)
I wrote the script yesterday for rush. Was that not correct?   I actually looked through all of her testing and saw that we had neglected to mix in red cedar which was positive somewhere along the line.   If she has vials made already, we can just leave it out and add later if needed.   Salvatore Marvel, MD Allergy and [place] [facility] [place]

## 2022-12-28 NOTE — Progress Notes (Signed)
Aeroallergen Immunotherapy   Ordering Provider: Dr. Salvatore Marvel   Patient Details  Name: Tracey Escobar  MRN: [id number]  Date of Birth: [birth year]   Order 1 of 1   Vial Label: G/T/DM   0.3 ml (Volume)  BAU Concentration -- 7 Grass Mix* 100,000 ([id number] [place], [place], [place], [place]ial [place], [place], Sweet [place], [place])  0.7 ml (Volume)  1:20 Concentration -- Eastern 10 Tree Mix (also Sweet Gum)  0.2 ml (Volume)  1:10 Concentration -- Cedar, red  0.8 ml (Volume)   AU Concentration -- Mite Mix (DF 5,000 & DP 5,000)    2.0  ml Extract Subtotal  3.0  ml Diluent  5.0  ml Maintenance Total   Schedule:  B   Silver Vial (1:1,000,000): RUSH  Blue Vial (1:100,000): RUSH  Yellow Vial (1:10,000): RUSH  Green Vial (1:1,000): Schedule B (6 doses)  Red Vial (1:100): Schedule A (14 doses)   Special Instructions: RUSH, B Schedule for Allena Katz, and then A Schedule for Red Vial.

## 2022-12-28 NOTE — Progress Notes (Signed)
VIALS FOR RUSH EXP 12-28-23

## 2022-12-28 NOTE — Progress Notes (Unsigned)
I wrote the script a second time. Hopefully this one is fine.   Salvatore Marvel, MD Allergy and [place] [facility] [place]

## 2022-12-29 ENCOUNTER — Other Ambulatory Visit (HOSPITAL_COMMUNITY): Payer: Self-pay

## 2022-12-30 ENCOUNTER — Other Ambulatory Visit (HOSPITAL_COMMUNITY): Payer: Self-pay

## 2022-12-31 ENCOUNTER — Other Ambulatory Visit: Payer: Self-pay | Admitting: *Deleted

## 2022-12-31 ENCOUNTER — Other Ambulatory Visit: Payer: Self-pay

## 2022-12-31 ENCOUNTER — Other Ambulatory Visit (HOSPITAL_COMMUNITY): Payer: Self-pay

## 2022-12-31 MED ORDER — FLUTICASONE-UMECLIDIN-VILANT 200-62.5-25 MCG/ACT IN AEPB: INHALATION_SPRAY | RESPIRATORY_TRACT | 0 refills | Status: DC

## 2023-01-06 ENCOUNTER — Ambulatory Visit (INDEPENDENT_AMBULATORY_CARE_PROVIDER_SITE_OTHER): Payer: 59 | Admitting: Family Medicine

## 2023-01-07 ENCOUNTER — Ambulatory Visit: Payer: BC Managed Care – PPO

## 2023-01-08 ENCOUNTER — Telehealth: Payer: Self-pay

## 2023-01-08 ENCOUNTER — Other Ambulatory Visit: Payer: Self-pay

## 2023-01-08 MED ORDER — PREDNISONE 20 MG PO TABS: ORAL_TABLET | ORAL | 0 refills | Status: DC

## 2023-01-08 MED ORDER — FAMOTIDINE 20 MG PO TABS: ORAL_TABLET | ORAL | 0 refills | Status: DC

## 2023-01-08 NOTE — Telephone Encounter (Signed)
Called to confirm participation in PREP class starting April 29, left voicemail requesting return call.

## 2023-01-12 ENCOUNTER — Ambulatory Visit (INDEPENDENT_AMBULATORY_CARE_PROVIDER_SITE_OTHER): Payer: BC Managed Care – PPO

## 2023-01-12 DIAGNOSIS — N951 Menopausal and female climacteric states: Secondary | ICD-10-CM | POA: Diagnosis not present

## 2023-01-12 DIAGNOSIS — M1712 Unilateral primary osteoarthritis, left knee: Secondary | ICD-10-CM | POA: Diagnosis not present

## 2023-01-12 DIAGNOSIS — J4550 Severe persistent asthma, uncomplicated: Secondary | ICD-10-CM

## 2023-01-12 DIAGNOSIS — N898 Other specified noninflammatory disorders of vagina: Secondary | ICD-10-CM | POA: Diagnosis not present

## 2023-01-12 DIAGNOSIS — Z01419 Encounter for gynecological examination (general) (routine) without abnormal findings: Secondary | ICD-10-CM | POA: Diagnosis not present

## 2023-01-12 DIAGNOSIS — M25562 Pain in left knee: Secondary | ICD-10-CM | POA: Diagnosis not present

## 2023-01-12 DIAGNOSIS — Z1231 Encounter for screening mammogram for malignant neoplasm of breast: Secondary | ICD-10-CM | POA: Diagnosis not present

## 2023-01-14 ENCOUNTER — Telehealth: Payer: Self-pay

## 2023-01-14 NOTE — Telephone Encounter (Signed)
Called again re: PREP class that starts 4/29, left voicemail requesting call back.

## 2023-01-15 ENCOUNTER — Ambulatory Visit: Payer: BC Managed Care – PPO | Admitting: Internal Medicine

## 2023-01-15 ENCOUNTER — Encounter: Payer: Self-pay | Admitting: Internal Medicine

## 2023-01-15 VITALS — BP 104/72 | HR 68 | Temp 97.7°F | Resp 16

## 2023-01-15 DIAGNOSIS — J309 Allergic rhinitis, unspecified: Secondary | ICD-10-CM

## 2023-01-15 DIAGNOSIS — J3089 Other allergic rhinitis: Secondary | ICD-10-CM

## 2023-01-15 NOTE — Progress Notes (Signed)
RAPID DESENSITIZATION Note  RE: Tracey Escobar MRN: [id number] DOB: 08-15-1971 Date of Office Visit: 01/15/2023  Subjective:  Patient presents today for rapid desensitization.  Interval History: Patient has not been ill, she has taken all premedications as per protocol.  Recent/Current History: Pulmonary disease: no Cardiac disease: no Respiratory infection: no Rash: no Itch: no Swelling: no Cough: no Shortness of breath: no Runny/stuffy nose: no Itchy eyes: no  Patient/guardian was informed of the procedure with verbalized understanding of the risk of anaphylaxis. Consent has been signed.   Medication List:  Current Outpatient Medications  Medication Sig Dispense Refill   Azelastine-Fluticasone 137-50 MCG/ACT SUSP USE 2 SPRAYS IN EACH NOSTRIL ONCE DAILY 23 g 5   Bepotastine Besilate 1.5 % SOLN INSTILL 1 DROP IN BOTH EYES DAILY AS NEEDED 10 mL 5   CALCIUM PO Take 1 tablet by mouth daily at 2 am.     Doxepin HCl 6 MG TABS Take 1 tablet (6 mg total) by mouth at bedtime as needed. 30 tablet 1   EPINEPHRINE 0.3 mg/0.3 mL IJ SOAJ injection ADMINISTER 0.3 MG IN THE MUSCLE 1 TIME FOR 1 DOSE 2 each 1   Estradiol 10 MCG TABS vaginal tablet estradiol 10 mcg vaginal tablet     famotidine (PEPCID) 20 MG tablet Take 1 tab twice daily Thursday and Friday for RUSH appt 4/12 4 tablet 0   flecainide (TAMBOCOR) 100 MG tablet Take by mouth.     Fluticasone-Umeclidin-Vilant (TRELEGY ELLIPTA) 200-62.5-25 MCG/ACT AEPB Inhale one puff once daily 60 each 0   hydrochlorothiazide (HYDRODIURIL) 12.5 MG tablet TAKE 1 TABLET(12.5 MG) BY MOUTH DAILY 90 tablet 1   Lemborexant (DAYVIGO) 10 MG TABS Take 10 mg by mouth at bedtime as needed. 30 tablet 2   levocetirizine (XYZAL) 5 MG tablet TAKE 1 TABLET BY MOUTH EVERY DAY IN THE EVENING 90 tablet 0   losartan (COZAAR) 25 MG tablet TAKE 1 TABLET(25 MG) BY MOUTH DAILY 90 tablet 1   medroxyPROGESTERone (DEPO-PROVERA) 150 MG/ML injection medroxyprogesterone  150 mg/mL intramuscular suspension  Inject 1 mL every 3 months by intramuscular route around the clock.     Mepolizumab (NUCALA) 100 MG/ML SOAJ INJECT THE CONTENTS OF 1 PEN (100 MG) UNDER THE SKIN EVERY 4 WEEKS 1 mL 11   metoprolol succinate (TOPROL-XL) 100 MG 24 hr tablet Take 100 mg by mouth daily.      montelukast (SINGULAIR) 10 MG tablet TAKE 1 TABLET(10 MG) BY MOUTH AT BEDTIME 90 tablet 0   pantoprazole (PROTONIX) 40 MG tablet TAKE 1 TABLET(40 MG) BY MOUTH DAILY 90 tablet 1   predniSONE (DELTASONE) 20 MG tablet Take 2 tabs Thursday morning and Friday morning before RUSH appt. 4/12 4 tablet 0   RESTASIS 0.05 % ophthalmic emulsion INSTILL 1 DROP IN BOTH EYES TWICE DAILY 60 each 1   rivaroxaban (XARELTO) 20 MG TABS tablet TAKE 1 TABLET(20 MG) BY MOUTH WITH SUPPER     Current Facility-Administered Medications  Medication Dose Route Frequency Provider Last Rate Last Admin   cyanocobalamin ((VITAMIN B-12)) injection 1,000 mcg  1,000 mcg Intramuscular Once Ghumman, Ramandeep, NP       Mepolizumab SOLR 100 mg  100 mg Subcutaneous Q28 days Wyline Mood M, DO   100 mg at 01/12/23 1411   Allergies: No Known Allergies I reviewed her past medical history, social history, family history, and environmental history and no significant changes have been reported from her previous visit.  ROS: Negative except as per HPI.  Objective:  There were no vitals taken for this visit. There is no height or weight on file to calculate BMI.   General Appearance:  Alert, cooperative, no distress, appears stated age  Head:  Normocephalic, without obvious abnormality, atraumatic  Eyes:  Conjunctiva clear, EOM's intact  Nose: Nares normal  Throat: Lips, tongue normal; teeth and gums normal, normal posterior oropharnyx  Neck: Supple, symmetrical  Lungs:   CTAB, Respirations unlabored, no coughing  Heart:  RRR, no murmur, Appears well perfused  Extremities: No edema  Skin: Skin color, texture, turgor normal, no  rashes or lesions on visualized portions of skin  Neurologic: No gross deficits     Diagnostics: PROCEDURES:  Patient received the following doses every hour: Step 1:  0.3ml - 1:1,000,000 dilution (silver vial) Step 2:  0.52ml - 1:1,000,000 dilution (silver vial) Step 3: 0.74ml - 1:100,000 dilution (blue vial)  Step 4: 0.20ml - 1:100,000 dilution (blue vial)  Step 5: 0.58ml - 1:10,000 dilution (gold vial) Step 6: 0.36ml - 1:10,000 dilution (gold vial) Step 7: 0.68ml - 1:10,000 dilution (gold vial) Step 8: 0.64ml - 1:10,000 dilution (gold vial)  Patient was observed for 1 hour after the last dose.   Procedure started at 8:30 AM Procedure ended at 2:32 PM   ASSESSMENT/PLAN:   Patient has tolerated the rapid desensitization protocol.  Next appointment: Start at 0.40ml of 1:1000 dilution (green vial) and build up per protocol.

## 2023-01-18 DIAGNOSIS — N951 Menopausal and female climacteric states: Secondary | ICD-10-CM | POA: Diagnosis not present

## 2023-01-19 ENCOUNTER — Ambulatory Visit: Payer: BC Managed Care – PPO | Admitting: Allergy & Immunology

## 2023-01-19 DIAGNOSIS — J309 Allergic rhinitis, unspecified: Secondary | ICD-10-CM

## 2023-01-19 LAB — HM PAP SMEAR

## 2023-01-22 ENCOUNTER — Ambulatory Visit (INDEPENDENT_AMBULATORY_CARE_PROVIDER_SITE_OTHER): Payer: BC Managed Care – PPO

## 2023-01-22 DIAGNOSIS — J3089 Other allergic rhinitis: Secondary | ICD-10-CM | POA: Diagnosis not present

## 2023-01-25 ENCOUNTER — Other Ambulatory Visit: Payer: Self-pay

## 2023-01-26 ENCOUNTER — Other Ambulatory Visit (HOSPITAL_COMMUNITY): Payer: Self-pay

## 2023-01-29 ENCOUNTER — Ambulatory Visit (INDEPENDENT_AMBULATORY_CARE_PROVIDER_SITE_OTHER): Payer: BC Managed Care – PPO

## 2023-01-29 DIAGNOSIS — J309 Allergic rhinitis, unspecified: Secondary | ICD-10-CM

## 2023-02-01 ENCOUNTER — Other Ambulatory Visit: Payer: Self-pay

## 2023-02-01 ENCOUNTER — Other Ambulatory Visit (HOSPITAL_COMMUNITY): Payer: Self-pay

## 2023-02-05 ENCOUNTER — Ambulatory Visit (INDEPENDENT_AMBULATORY_CARE_PROVIDER_SITE_OTHER): Payer: BC Managed Care – PPO

## 2023-02-05 DIAGNOSIS — J309 Allergic rhinitis, unspecified: Secondary | ICD-10-CM

## 2023-02-09 ENCOUNTER — Ambulatory Visit (INDEPENDENT_AMBULATORY_CARE_PROVIDER_SITE_OTHER): Payer: BC Managed Care – PPO

## 2023-02-09 DIAGNOSIS — J4550 Severe persistent asthma, uncomplicated: Secondary | ICD-10-CM | POA: Diagnosis not present

## 2023-02-11 ENCOUNTER — Ambulatory Visit (INDEPENDENT_AMBULATORY_CARE_PROVIDER_SITE_OTHER): Payer: BC Managed Care – PPO | Admitting: Allergy & Immunology

## 2023-02-11 ENCOUNTER — Other Ambulatory Visit: Payer: Self-pay

## 2023-02-11 ENCOUNTER — Encounter: Payer: Self-pay | Admitting: Allergy & Immunology

## 2023-02-11 VITALS — BP 110/64 | HR 81 | Temp 98.5°F | Resp 24 | Ht 67.0 in | Wt 310.4 lb

## 2023-02-11 DIAGNOSIS — J4540 Moderate persistent asthma, uncomplicated: Secondary | ICD-10-CM | POA: Diagnosis not present

## 2023-02-11 DIAGNOSIS — K219 Gastro-esophageal reflux disease without esophagitis: Secondary | ICD-10-CM

## 2023-02-11 DIAGNOSIS — J3089 Other allergic rhinitis: Secondary | ICD-10-CM | POA: Diagnosis not present

## 2023-02-11 DIAGNOSIS — J302 Other seasonal allergic rhinitis: Secondary | ICD-10-CM

## 2023-02-11 MED ORDER — CETIRIZINE HCL 10 MG PO TABS: 10.0000 mg | ORAL_TABLET | ORAL | 2 refills | Status: DC

## 2023-02-11 MED ORDER — AZELASTINE-FLUTICASONE 137-50 MCG/ACT NA SUSP: 2.0000 | Freq: Every day | NASAL | 2 refills | Status: DC

## 2023-02-11 MED ORDER — ALBUTEROL SULFATE HFA 108 (90 BASE) MCG/ACT IN AERS: 2.0000 | INHALATION_SPRAY | RESPIRATORY_TRACT | 1 refills | Status: DC | PRN

## 2023-02-11 MED ORDER — LEVOCETIRIZINE DIHYDROCHLORIDE 5 MG PO TABS: 5.0000 mg | ORAL_TABLET | Freq: Every evening | ORAL | 2 refills | Status: DC

## 2023-02-11 MED ORDER — CROMOLYN SODIUM 4 % OP SOLN: 1.0000 [drp] | Freq: Four times a day (QID) | OPHTHALMIC | 12 refills | Status: DC

## 2023-02-11 MED ORDER — FLUTICASONE-UMECLIDIN-VILANT 200-62.5-25 MCG/ACT IN AEPB: INHALATION_SPRAY | RESPIRATORY_TRACT | 2 refills | Status: DC

## 2023-02-11 NOTE — Progress Notes (Signed)
FOLLOW UP  Date of Service/Encounter:  02/11/23   Assessment:   Moderate persistent asthma, uncomplicated - doing better on Nucala monthly   Perennial allergic rhinitis (grasses, trees, dust mite)  - on allergen immunotherapy since before 2016 but has not reached maintenance   Gastroesophageal reflux disease   Atrial fibrillation - on Xarelto and flecanide   Previously on amiodarone  Plan/Recommendations:   1. Moderate persistent asthma without complication - Lung testing looks great today for you, but it is still not normal.  - We may consider changing to Dupixent in place of Nucala (this might help to block a lot of the rhinitis symptoms).  - Daily controller medication(s): Trelegy one puff once daily + Nucala monthly - Prior to physical activity: albuterol 2 puffs 10-15 minutes before physical activity. - Rescue medications: albuterol 4 puffs every 4-6 hours as needed - Asthma control goals:  * Full participation in all desired activities (may need albuterol before activity) * Albuterol use two time or less a week on average (not counting use with activity) * Cough interfering with sleep two time or less a month * Oral steroids no more than once a year * No hospitalizations  2. Gastroesophageal reflux disease - Continue with omeprazole daily.   3. Seasonal and perennial allergic rhinitis (dust mites, grasses, trees) - We will continue with allergy shots at the same schedule.  - You now have the schedule down for your allergy shots.  - Continue with levocetirizine 5 mg at night. - Start Zyrtec (cetirizine) 10mg  in the morning.  - We will continue with Dymista. - Continue with your current eye drop (beponastine) as you are doing. - Add Opticrom (cromolyn eye drops) four times daily to stabilize the mast cells in your eye area.    4. Return in about 3 months (around 05/14/2023).   Subjective:   Tracey Escobar is a 52 y.o. female presenting today for follow up of   Chief Complaint  Patient presents with   Follow-up   Nasal Congestion    Pt c/o red itchy watery gritty eyes with nasal congestion everyday.    Tracey Escobar has a history of the following: Patient Active Problem List   Diagnosis Date Noted   Chronic constipation 02/11/2022   Irritable bowel syndrome with constipation 11/17/2021   Arthralgia 11/17/2021   Sleep deprivation 10/07/2021   Excessive postexertional fatigue 10/07/2021   Hypersomnia, persistent 10/07/2021   OSA on CPAP 10/07/2021   Allergic conjunctivitis of both eyes 06/21/2019   OSA (obstructive sleep apnea) 10/17/2018   Class 3 severe obesity due to excess calories with serious comorbidity and body mass index (BMI) of 45.0 to 49.9 in adult [facility]) 08/09/2018   Paroxysmal atrial fibrillation (HCC) 08/09/2018   Excessive daytime sleepiness 08/09/2018   Cataplexy 08/09/2018   Abnormal dreams 08/09/2018   Nocturia more than twice per night 08/09/2018   Hypertensive heart disease without heart failure 06/17/2018   Insomnia 09/08/2017   Morbid obesity (HCC) 09/08/2017   Other long term (current) drug therapy 09/08/2017   Paresthesia of skin 09/08/2017   Snoring 09/08/2017   Allergic conjunctivitis 09/08/2017   Moderate persistent asthma 12/15/2016   Atrial fibrillation with RVR (HCC) 12/15/2016   Morbid obesity with BMI of 40.0-44.9, adult (HCC) 12/15/2016   Acute sinusitis 09/02/2016   Internal hemorrhoid 07/19/2015   Gastrointestinal hemorrhage associated with anorectal source 07/17/2015   Hypertensive disorder 07/17/2015   Lower abdominal pain 07/17/2015   Allergic rhinitis 06/11/2015  Gastroesophageal reflux disease 06/11/2015    History obtained from: chart review and patient.  Tracey Escobar is a 52 y.o. female presenting for a follow up visit.  I last saw her in August 2023.  At that time, she was doing well on Trelegy 1 puff once daily as well as Nucala.  Her spirometry has never looked very good.  For her  GERD, we continue with omeprazole.  We also continue with Dymista and levocetirizine for her allergic rhinitis.  She was last seen in April 2024 at which time she underwent rush immunotherapy.  She has been on immunotherapy for a number of years, but she will get inconsistent and we need to restart it.  She decided to do Rush immunotherapy to try to get up to maintenance a lot more quickly.  Asthma/Respiratory Symptom History: She remains on the Trelegy 1 puff once daily.  She also does the mepolizumab monthly.  She has been on mepolizumab for a number of years.  It was helping initially, but she is not entirely sure it is working as effectively as it used to.  She is open to trying new medications.  She has been using her rescue inhaler at least daily, if not more. [name]'s asthma has NOT been well controlled. She has required rescue medication and experienced nocturnal awakenings due to lower respiratory symptom. She has not required prednisone or ED visits. She has required zero courses of systemic steroids for asthma exacerbations since the last visit. ACT score today is 15, indicating subpar asthma symptom control.    Allergic Rhinitis Symptom History: She remains on the Dymista which seems to be doing the trick. But she feels that the medications stop working when she has been on them for a while . She remains on her Xyzal. She is doing well with her shots. She is having some eye redness and itching. She is using Bepreve eye drops. She is congested in her nose. She remains on the montelukast. These symptoms are all year long. She has sinus issues nearly consistently. She tries to stay indoors. Symptoms have been worse over the last couple of weeks. Her rhinitis symptoms have never really seemed to improve with the allergy shots, although as mentioned she has never reached maintenance with her injections.   Tracey Escobar is on allergen immunotherapy. She receives one injection. Immunotherapy script #1  contains trees, grasses, and dust mites. She currently receives 0.22mL of the GREEN vial (1/1,000).  She started shots April of 2024 and not yet reached maintenance. She has been more consistent with her injections. Since under going rush immunotherapy.   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 She does do a lot of home visits with her current job. She seems to really be enjoying it!   Otherwise, there have been no changes to her past medical history, surgical history, family history, or social history.    Review of Systems  Constitutional: Negative.  Negative for chills, fever, malaise/fatigue and weight loss.  HENT: Negative.  Negative for congestion, ear discharge, ear pain and sinus pain.   Eyes:  Negative for photophobia, pain, discharge and redness.  Respiratory:  Positive for cough and wheezing. Negative for sputum production and shortness of breath.   Cardiovascular: Negative.  Negative for chest pain and palpitations.  Gastrointestinal:  Negative for abdominal pain, constipation, diarrhea, heartburn, nausea and vomiting.  Skin: Negative.  Negative for itching and rash.  Neurological:  Negative for dizziness and headaches.  Endo/Heme/Allergies:  Positive for environmental allergies. Does not bruise/bleed easily.       Objective:   Blood pressure 110/64, pulse 81, temperature 98.5 F (36.9 C), resp. rate (!) 24, height 5\' 7"  (1.702 m), weight (!) 310 lb 6.4 oz (140.8 kg), SpO2 99 %. Body mass index is 48.62 kg/m.    Physical Exam Vitals reviewed.  Constitutional:      General: She is not in acute distress.    Appearance: She is well-developed.     Comments: Talkative. Friendly.  Well-appearing.  HENT:     Head: Normocephalic and atraumatic.     Right Ear: Tympanic membrane, ear canal and external ear normal.     Left Ear: Tympanic membrane, ear canal and external ear normal.     Nose: Rhinorrhea present. No nasal deformity, septal deviation  or mucosal edema.     Right Turbinates: Enlarged, swollen and pale.     Left Turbinates: Enlarged, swollen and pale.     Right Sinus: No maxillary sinus tenderness or frontal sinus tenderness.     Left Sinus: No maxillary sinus tenderness or frontal sinus tenderness.     Comments: No nasal polyps.  Copious rhinorrhea.    Mouth/Throat:     Mouth: Mucous membranes are not pale and not dry.     Pharynx: Uvula midline.  Eyes:     General: Lids are normal. No allergic shiner.       Right eye: No discharge.        Left eye: No discharge.     Conjunctiva/sclera: Conjunctivae normal.     Right eye: Right conjunctiva is not injected. No chemosis.    Left eye: Left conjunctiva is not injected. No chemosis.    Pupils: Pupils are equal, round, and reactive to light.  Cardiovascular:     Rate and Rhythm: Normal rate and regular rhythm.     Heart sounds: Normal heart sounds.  Pulmonary:     Effort: Pulmonary effort is normal. No tachypnea, accessory muscle usage or respiratory distress.     Breath sounds: Normal breath sounds. No wheezing, rhonchi or rales.     Comments: Moving air well in all lung fields. No increased work of breathing noted.  There may be some decreased breath sounds at the bases due to body habitus. Chest:     Chest wall: No tenderness.  Lymphadenopathy:     Cervical: No cervical adenopathy.  Skin:    Coloration: Skin is not pale.     Findings: No abrasion, erythema, petechiae or rash. Rash is not papular, urticarial or vesicular.  Neurological:     Mental Status: She is alert.  Psychiatric:        Behavior: Behavior is cooperative.      Diagnostic studies:    Spirometry: results abnormal (FEV1: 1.69/66%, FVC: 1.99/62%, FEV1/FVC: 85%).   Spirometry consistent with possible restrictive disease. This is better than last time.   Allergy Studies: none       Malachi Bonds, MD  [facility]

## 2023-02-11 NOTE — Patient Instructions (Addendum)
1. Moderate persistent asthma without complication - Lung testing looks great today for you, but it is still not normal.  - We may consider changing to Dupixent in place of Nucala (this might help to block a lot of the rhinitis symptoms).  - Daily controller medication(s): Trelegy one puff once daily + Nucala monthly - Prior to physical activity: albuterol 2 puffs 10-15 minutes before physical activity. - Rescue medications: albuterol 4 puffs every 4-6 hours as needed - Asthma control goals:  * Full participation in all desired activities (may need albuterol before activity) * Albuterol use two time or less a week on average (not counting use with activity) * Cough interfering with sleep two time or less a month * Oral steroids no more than once a year * No hospitalizations  2. Gastroesophageal reflux disease - Continue with omeprazole daily.   3. Seasonal and perennial allergic rhinitis (dust mites, grasses, trees) - We will continue with allergy shots at the same schedule.  - You now have the schedule down for your allergy shots.  - Continue with levocetirizine 5 mg at night. - Start Zyrtec (cetirizine) 10mg  in the morning.  - We will continue with Dymista. - Continue with your current eye drop (beponastine) as you are doing. - Add Opticrom (cromolyn eye drops) four times daily to stabilize the mast cells in your eye area.    4. Return in about 3 months (around 05/14/2023).    Please inform us of any Emergency Department visits, hospitalizations, or changes in symptoms. Call us before going to the ED for breathing or allergy symptoms since we might be able to fit you in for a sick visit. Feel free to contact us anytime with any questions, problems, or concerns.  It was a pleasure to see you again today!  Websites that have reliable patient information: 1. American Academy of Asthma, Allergy, and Immunology: www.aaaai.org 2. Food Allergy Research and Education (FARE):  foodallergy.org 3. Mothers of Asthmatics: http://www.asthmacommunitynetwork.org 4. American College of Allergy, Asthma, and Immunology: www.acaai.org   COVID-19 Vaccine Information can be found at: [email or web address].[email or web address] For questions related to vaccine distribution or appointments, please email [email or web address] or call [phone].   We realize that you might be concerned about having an allergic reaction to the COVID19 vaccines. To help with that concern, WE ARE OFFERING THE COVID19 VACCINES IN OUR OFFICE! Ask the front desk for dates!     "Like" [place] on Facebook and Instagram for our latest updates!      A healthy democracy works best when [employer] participate! Make sure you are registered to vote! If you have moved or changed any of your contact information, you will need to get this updated before voting!  In some cases, you MAY be able to register to vote online: [email or web address]

## 2023-02-12 ENCOUNTER — Encounter: Payer: Self-pay | Admitting: Allergy & Immunology

## 2023-02-18 ENCOUNTER — Ambulatory Visit (INDEPENDENT_AMBULATORY_CARE_PROVIDER_SITE_OTHER): Payer: BC Managed Care – PPO

## 2023-02-18 DIAGNOSIS — J309 Allergic rhinitis, unspecified: Secondary | ICD-10-CM | POA: Diagnosis not present

## 2023-02-23 ENCOUNTER — Telehealth: Payer: Self-pay

## 2023-02-23 ENCOUNTER — Other Ambulatory Visit (HOSPITAL_COMMUNITY): Payer: Self-pay

## 2023-02-23 ENCOUNTER — Ambulatory Visit (INDEPENDENT_AMBULATORY_CARE_PROVIDER_SITE_OTHER): Payer: BC Managed Care – PPO

## 2023-02-23 ENCOUNTER — Telehealth: Payer: Self-pay | Admitting: *Deleted

## 2023-02-23 ENCOUNTER — Other Ambulatory Visit: Payer: Self-pay

## 2023-02-23 DIAGNOSIS — J309 Allergic rhinitis, unspecified: Secondary | ICD-10-CM | POA: Diagnosis not present

## 2023-02-23 MED ORDER — DUPIXENT 300 MG/2ML ~~LOC~~ SOSY
600.0000 mg | PREFILLED_SYRINGE | Freq: Once | SUBCUTANEOUS | 0 refills | Status: AC
  Filled 2023-02-23: qty 4, 1d supply, fill #0
  Filled 2023-02-24: qty 4, 28d supply, fill #0

## 2023-02-23 NOTE — Telephone Encounter (Signed)
Thanks Tammy 

## 2023-02-23 NOTE — Telephone Encounter (Signed)
Called patient and advised approval, copay card and submit to [facility] for Dupixent. Patient wants to get admin in clinic so she will start at her next upcoming appt

## 2023-02-23 NOTE — Telephone Encounter (Signed)
Returned her call, she asked if program available at [place]; shared that it is only available at [facility]; could do an evening class at [place] so will ask Pam RN [facility] contact her when next class is available/scheduled at [place].

## 2023-02-23 NOTE — Telephone Encounter (Signed)
-----   Message from Alfonse Spruce, MD sent at 02/12/2023  8:17 AM EDT ----- Changing to Dupixent.

## 2023-02-24 ENCOUNTER — Other Ambulatory Visit: Payer: Self-pay

## 2023-02-24 ENCOUNTER — Other Ambulatory Visit (HOSPITAL_COMMUNITY): Payer: Self-pay

## 2023-02-25 ENCOUNTER — Other Ambulatory Visit: Payer: Self-pay

## 2023-03-01 ENCOUNTER — Other Ambulatory Visit: Payer: Self-pay | Admitting: Internal Medicine

## 2023-03-01 ENCOUNTER — Other Ambulatory Visit: Payer: Self-pay | Admitting: Allergy & Immunology

## 2023-03-02 ENCOUNTER — Other Ambulatory Visit (HOSPITAL_COMMUNITY): Payer: Self-pay

## 2023-03-03 ENCOUNTER — Encounter: Payer: Self-pay | Admitting: Family Medicine

## 2023-03-03 ENCOUNTER — Ambulatory Visit (INDEPENDENT_AMBULATORY_CARE_PROVIDER_SITE_OTHER): Payer: BC Managed Care – PPO | Admitting: Family Medicine

## 2023-03-03 VITALS — BP 110/60 | HR 76 | Temp 98.1°F | Ht 67.0 in | Wt 306.8 lb

## 2023-03-03 DIAGNOSIS — I480 Paroxysmal atrial fibrillation: Secondary | ICD-10-CM

## 2023-03-03 DIAGNOSIS — Z0001 Encounter for general adult medical examination with abnormal findings: Secondary | ICD-10-CM | POA: Diagnosis not present

## 2023-03-03 DIAGNOSIS — Z0000 Encounter for general adult medical examination without abnormal findings: Secondary | ICD-10-CM

## 2023-03-03 DIAGNOSIS — I119 Hypertensive heart disease without heart failure: Secondary | ICD-10-CM | POA: Diagnosis not present

## 2023-03-03 DIAGNOSIS — E66813 Obesity, class 3: Secondary | ICD-10-CM

## 2023-03-03 DIAGNOSIS — Z6841 Body Mass Index (BMI) 40.0 and over, adult: Secondary | ICD-10-CM

## 2023-03-03 DIAGNOSIS — E559 Vitamin D deficiency, unspecified: Secondary | ICD-10-CM | POA: Diagnosis not present

## 2023-03-03 LAB — POCT URINALYSIS DIPSTICK
Bilirubin, UA: NEGATIVE
Glucose, UA: NEGATIVE
Ketones, UA: NEGATIVE
Leukocytes, UA: NEGATIVE
Nitrite, UA: NEGATIVE
Protein, UA: NEGATIVE
Spec Grav, UA: 1.01 (ref 1.010–1.025)
Urobilinogen, UA: 0.2 E.U./dL
pH, UA: 6.5 (ref 5.0–8.0)

## 2023-03-03 NOTE — Progress Notes (Unsigned)
I,Tracey Escobar,acting as a Neurosurgeon for [employer], NP.,have documented all relevant documentation on the behalf of Tracey Bosserman, NP,as directed by  Tracey Tello Moshe Salisbury, NP while in the presence of Tracey Vogl, NP.   Subjective:     Patient ID: Tracey Escobar , female    DOB: [birth year] , 52 y.o.   MRN: [id number]   Chief Complaint  Patient presents with   Annual Exam    HPI  She is here today for her annual examination,  She reports compliance with meds. She wants to discuss getting the Losartan HCTZ combo because her Cardiologist suggested that they sometimes work better. The combo is Losartan-HCTZ 50/12.5mg  but patient is currently on Losartan 25mg . BP today in the office is 110/60 , patient was informed that the combo is twice the dose that she is currently getting and it may not be the best thing to increase her medication to twice the strength when her BP is under control, patient voiced understanding. Patient states she has had about 2 falls in the past 1 year due to joint pains especially in both knees,so she will begin physical therapy in the next few weeks, she goes to [facility] for treatment. Patient states she saw Dr Tracey Escobar, the Gyneacologist on 01/12/2023 and she had her Pap smear and mammogram done.  Hypertension This is a chronic problem. The current episode started more than 1 year ago. The problem has been gradually improving since onset. The problem is controlled. Pertinent negatives include no blurred vision. Risk factors for coronary artery disease include obesity. Past treatments include lifestyle changes, angiotensin blockers and diuretics. The current treatment provides moderate improvement. There is no history of chronic renal disease.     Past Medical History:  Diagnosis Date   Arthritis    Asthma    Atrial fibrillation (HCC)    Dysrhythmia    A-Fib   GERD (gastroesophageal reflux disease)    Hypertension     Irritable bowel    Pneumonia    Sleep apnea    Sleep apnea    Vitamin D deficiency      Family History  Problem Relation Age of Onset   Hyperlipidemia Mother    Hypertension Mother    Hyperlipidemia Father    Hypertension Father    COPD Father    Heart failure Father    Diabetes Brother    Hypertension Maternal Grandmother    Seizures Maternal Grandmother    Cancer Paternal Grandfather    Cancer Brother    Allergic rhinitis Neg Hx    Angioedema Neg Hx    Asthma Neg Hx    Eczema Neg Hx    Immunodeficiency Neg Hx    Urticaria Neg Hx      Current Outpatient Medications:    albuterol (VENTOLIN HFA) 108 (90 Base) MCG/ACT inhaler, Inhale 2 puffs into the lungs every 4 (four) hours as needed for wheezing or shortness of breath., Disp: 18 g, Rfl: 1   Azelastine-Fluticasone 137-50 MCG/ACT SUSP, Place 2 sprays into both nostrils daily., Disp: 23 g, Rfl: 2   Bepotastine Besilate 1.5 % SOLN, INSTILL 1 DROP IN BOTH EYES DAILY AS NEEDED, Disp: 10 mL, Rfl: 5   CALCIUM PO, Take 1 tablet by mouth daily at 2 am., Disp: , Rfl:    cetirizine (ZYRTEC) 10 MG tablet, Take 1 tablet (10 mg total) by mouth every morning., Disp: 30 tablet, Rfl: 2   cromolyn (OPTICROM) 4 %  ophthalmic solution, Place 1 drop into both eyes 4 (four) times daily., Disp: 10 mL, Rfl: 12   EPINEPHRINE 0.3 mg/0.3 mL IJ SOAJ injection, ADMINISTER 0.3 MG IN THE MUSCLE 1 TIME FOR 1 DOSE, Disp: 2 each, Rfl: 1   Estradiol 10 MCG TABS vaginal tablet, estradiol 10 mcg vaginal tablet, Disp: , Rfl:    flecainide (TAMBOCOR) 100 MG tablet, Take by mouth., Disp: , Rfl:    Fluticasone-Umeclidin-Vilant (TRELEGY ELLIPTA) 200-62.5-25 MCG/ACT AEPB, Inhale one puff once daily, Disp: 60 each, Rfl: 2   hydrochlorothiazide (HYDRODIURIL) 12.5 MG tablet, TAKE 1 TABLET(12.5 MG) BY MOUTH DAILY, Disp: 90 tablet, Rfl: 1   levocetirizine (XYZAL) 5 MG tablet, Take 1 tablet (5 mg total) by mouth every evening., Disp: 30 tablet, Rfl: 2   losartan (COZAAR)  25 MG tablet, TAKE 1 TABLET(25 MG) BY MOUTH DAILY, Disp: 90 tablet, Rfl: 1   magnesium gluconate (MAGONATE) 500 MG tablet, Take 500 mg by mouth daily., Disp: , Rfl:    metoprolol succinate (TOPROL-XL) 100 MG 24 hr tablet, Take 100 mg by mouth daily. , Disp: , Rfl:    montelukast (SINGULAIR) 10 MG tablet, TAKE 1 TABLET(10 MG) BY MOUTH AT BEDTIME, Disp: 30 tablet, Rfl: 5   pantoprazole (PROTONIX) 40 MG tablet, TAKE 1 TABLET(40 MG) BY MOUTH DAILY, Disp: 90 tablet, Rfl: 1   RESTASIS 0.05 % ophthalmic emulsion, INSTILL 1 DROP IN BOTH EYES TWICE DAILY, Disp: 60 each, Rfl: 1   rivaroxaban (XARELTO) 20 MG TABS tablet, TAKE 1 TABLET(20 MG) BY MOUTH WITH SUPPER, Disp: , Rfl:    Vitamin D, Ergocalciferol, (DRISDOL) 1.25 MG (50000 UNIT) CAPS capsule, Take 50,000 Units by mouth every 7 (seven) days., Disp: , Rfl:    Doxepin HCl 6 MG TABS, Take 1 tablet (6 mg total) by mouth at bedtime as needed. (Patient not taking: Reported on 03/03/2023), Disp: 30 tablet, Rfl: 1  Current Facility-Administered Medications:    cyanocobalamin ((VITAMIN B-12)) injection 1,000 mcg, 1,000 mcg, Intramuscular, Once, Escobar, Ramandeep, NP   No Known Allergies     Social History   Tobacco Use  Smoking Status Never  Smokeless Tobacco Never  . She has been exposed to passive smoke. The patient's alcohol use is:  Social History   Substance and Sexual Activity  Alcohol Use Yes   Alcohol/week: 1.0 standard drink of alcohol   Types: 1 Glasses of wine per week   Comment: rare use    Review of Systems  Constitutional: Negative.   HENT: Negative.    Eyes: Negative.  Negative for blurred vision.  Respiratory: Negative.    Cardiovascular: Negative.   Gastrointestinal: Negative.   Endocrine: Negative.   Genitourinary: Negative.   Musculoskeletal:  Positive for arthralgias.  Skin: Negative.   Allergic/Immunologic: Negative.   Neurological: Negative.   Hematological: Negative.   Psychiatric/Behavioral: Negative.        Today's Vitals   03/03/23 0846  BP: 110/60  Pulse: 76  Temp: 98.1 F (36.7 C)  Weight: (!) 306 lb 12.8 oz (139.2 kg)  Height: 5\' 7"  (1.702 m)  PainSc: 5    Body mass index is 48.05 kg/m.  Wt Readings from Last 3 Encounters:  03/03/23 (!) 306 lb 12.8 oz (139.2 kg)  02/11/23 (!) 310 lb 6.4 oz (140.8 kg)  11/16/22 (!) 304 lb (137.9 kg)     Objective:  Physical Exam Constitutional:      Appearance: Normal appearance.  HENT:     Head: Normocephalic.     Nose:  Nose normal.     Mouth/Throat:     Mouth: Mucous membranes are dry.     Pharynx: Oropharynx is clear.  Cardiovascular:     Rate and Rhythm: Normal rate.     Pulses: Normal pulses.     Heart sounds: Normal heart sounds.  Pulmonary:     Effort: Pulmonary effort is normal.     Breath sounds: Normal breath sounds.  Abdominal:     General: Bowel sounds are normal.  Musculoskeletal:        General: Normal range of motion.     Comments: Generalized body aches  Skin:    General: Skin is warm and dry.  Neurological:     General: No focal deficit present.     Mental Status: She is alert and oriented to person, place, and time. Mental status is at baseline.  Psychiatric:        Mood and Affect: Mood normal.         Assessment And Plan:     1. Encounter for general adult medical examination w/o abnormal findings  2. Hypertensive heart disease without heart failure Comments: Continue current treatment regimen - POCT Urinalysis Dipstick (81002) - Microalbumin / Creatinine Urine Ratio - EKG 12-Lead - Lipid panel - CMP14+EGFR - CBC no Diff  3. Paroxysmal atrial fibrillation (HCC) Comments: A-Fib stable: NSR on ECG 03/03/2023  4. Vitamin D deficiency disease Comments: Labs drawn - VITAMIN D 25 Hydroxy (Vit-D Deficiency, Fractures)  5. Class 3 severe obesity due to excess calories with serious comorbidity and body mass index (BMI) of 45.0 to 49.9 in adult [facility]) She is encouraged to strive for BMI less than 30  to decrease cardiac risk. Advised to aim for at least 150 minutes of exercise per week.   Return in 4 months (on 07/04/2023) for blood pressure. Patient was given opportunity to ask questions. Patient verbalized understanding of the plan and was able to repeat key elements of the plan. All questions were answered to their satisfaction.   Vale Mousseau Moshe Salisbury, NP   I, Meeah Totino Moshe Salisbury, NP, have reviewed all documentation for this visit. The documentation on 03/04/23 for the exam, diagnosis, procedures, and orders are all accurate and complete.   THE PATIENT IS ENCOURAGED TO PRACTICE SOCIAL DISTANCING DUE TO THE COVID-19 PANDEMIC.

## 2023-03-03 NOTE — Patient Instructions (Signed)

## 2023-03-04 ENCOUNTER — Other Ambulatory Visit (HOSPITAL_COMMUNITY): Payer: Self-pay

## 2023-03-04 ENCOUNTER — Other Ambulatory Visit: Payer: Self-pay

## 2023-03-04 LAB — LIPID PANEL
Chol/HDL Ratio: 2.8 ratio (ref 0.0–4.4)
Cholesterol, Total: 145 mg/dL (ref 100–199)
HDL: 52 mg/dL (ref >39)
LDL Chol Calc (NIH): 75 mg/dL (ref 0–99)
Triglycerides: 94 mg/dL (ref 0–149)
VLDL Cholesterol Cal: 18 mg/dL (ref 5–40)

## 2023-03-04 LAB — CMP14+EGFR
ALT: 14 IU/L (ref 0–32)
AST: 13 IU/L (ref 0–40)
Albumin/Globulin Ratio: 1.3 (ref 1.2–2.2)
Albumin: 4.1 g/dL (ref 3.8–4.9)
Alkaline Phosphatase: 108 IU/L (ref 44–121)
BUN/Creatinine Ratio: 12 (ref 9–23)
BUN: 10 mg/dL (ref 6–24)
Bilirubin Total: 0.3 mg/dL (ref 0.0–1.2)
CO2: 24 mmol/L (ref 20–29)
Calcium: 9.9 mg/dL (ref 8.7–10.2)
Chloride: 103 mmol/L (ref 96–106)
Creatinine, Ser: 0.81 mg/dL (ref 0.57–1.00)
Globulin, Total: 3.1 g/dL (ref 1.5–4.5)
Glucose: 83 mg/dL (ref 70–99)
Potassium: 3.9 mmol/L (ref 3.5–5.2)
Sodium: 141 mmol/L (ref 134–144)
Total Protein: 7.2 g/dL (ref 6.0–8.5)
eGFR: 87 mL/min/{1.73_m2} (ref >59)

## 2023-03-04 LAB — CBC
Hematocrit: 40.7 % (ref 34.0–46.6)
Hemoglobin: 13.4 g/dL (ref 11.1–15.9)
MCH: 29.1 pg (ref 26.6–33.0)
MCHC: 32.9 g/dL (ref 31.5–35.7)
MCV: 88 fL (ref 79–97)
Platelets: 289 10*3/uL (ref 150–450)
RBC: 4.61 x10E6/uL (ref 3.77–5.28)
RDW: 13.5 % (ref 11.7–15.4)
WBC: 5.1 10*3/uL (ref 3.4–10.8)

## 2023-03-04 LAB — VITAMIN D 25 HYDROXY (VIT D DEFICIENCY, FRACTURES): Vit D, 25-Hydroxy: 63.6 ng/mL (ref 30.0–100.0)

## 2023-03-05 ENCOUNTER — Ambulatory Visit (INDEPENDENT_AMBULATORY_CARE_PROVIDER_SITE_OTHER): Payer: BC Managed Care – PPO

## 2023-03-05 DIAGNOSIS — J309 Allergic rhinitis, unspecified: Secondary | ICD-10-CM

## 2023-03-05 LAB — MICROALBUMIN / CREATININE URINE RATIO
Creatinine, Urine: 202.3 mg/dL
Microalb/Creat Ratio: 12 mg/g creat (ref 0–29)
Microalbumin, Urine: 24.7 ug/mL

## 2023-03-06 ENCOUNTER — Other Ambulatory Visit: Payer: Self-pay | Admitting: Allergy & Immunology

## 2023-03-10 ENCOUNTER — Ambulatory Visit (INDEPENDENT_AMBULATORY_CARE_PROVIDER_SITE_OTHER): Payer: BC Managed Care – PPO

## 2023-03-10 DIAGNOSIS — J4550 Severe persistent asthma, uncomplicated: Secondary | ICD-10-CM | POA: Diagnosis not present

## 2023-03-10 MED ORDER — DUPILUMAB 300 MG/2ML ~~LOC~~ SOSY
300.0000 mg | PREFILLED_SYRINGE | SUBCUTANEOUS | Status: AC
Start: 2023-03-10 — End: ?
  Administered 2023-03-10 – 2023-10-28 (×12): 300 mg via SUBCUTANEOUS

## 2023-03-10 NOTE — Progress Notes (Signed)
Patient started dupixent 600 mg was given today. Patient will then continue dupixent 300 mg every 2 weeks for asthma. Lot number today was [id number] expiration date 04/04/2025

## 2023-03-12 ENCOUNTER — Ambulatory Visit (INDEPENDENT_AMBULATORY_CARE_PROVIDER_SITE_OTHER): Payer: BC Managed Care – PPO

## 2023-03-12 DIAGNOSIS — J309 Allergic rhinitis, unspecified: Secondary | ICD-10-CM | POA: Diagnosis not present

## 2023-03-14 ENCOUNTER — Other Ambulatory Visit: Payer: Self-pay | Admitting: Internal Medicine

## 2023-03-15 ENCOUNTER — Other Ambulatory Visit: Payer: Self-pay | Admitting: Internal Medicine

## 2023-03-15 DIAGNOSIS — M25562 Pain in left knee: Secondary | ICD-10-CM | POA: Diagnosis not present

## 2023-03-15 DIAGNOSIS — Z7409 Other reduced mobility: Secondary | ICD-10-CM | POA: Diagnosis not present

## 2023-03-15 DIAGNOSIS — M1712 Unilateral primary osteoarthritis, left knee: Secondary | ICD-10-CM | POA: Diagnosis not present

## 2023-03-15 DIAGNOSIS — Z1231 Encounter for screening mammogram for malignant neoplasm of breast: Secondary | ICD-10-CM

## 2023-03-18 ENCOUNTER — Ambulatory Visit (INDEPENDENT_AMBULATORY_CARE_PROVIDER_SITE_OTHER): Payer: BC Managed Care – PPO | Admitting: *Deleted

## 2023-03-18 DIAGNOSIS — J309 Allergic rhinitis, unspecified: Secondary | ICD-10-CM

## 2023-03-23 ENCOUNTER — Ambulatory Visit: Payer: BC Managed Care – PPO | Admitting: Family Medicine

## 2023-03-23 ENCOUNTER — Encounter: Payer: Self-pay | Admitting: Family Medicine

## 2023-03-23 VITALS — BP 110/82 | HR 74 | Temp 98.8°F | Ht 67.0 in | Wt 304.4 lb

## 2023-03-23 DIAGNOSIS — M25552 Pain in left hip: Secondary | ICD-10-CM | POA: Diagnosis not present

## 2023-03-23 DIAGNOSIS — M545 Low back pain, unspecified: Secondary | ICD-10-CM | POA: Diagnosis not present

## 2023-03-23 DIAGNOSIS — M25551 Pain in right hip: Secondary | ICD-10-CM

## 2023-03-23 DIAGNOSIS — Z6841 Body Mass Index (BMI) 40.0 and over, adult: Secondary | ICD-10-CM

## 2023-03-23 MED ORDER — PREDNISONE 5 MG PO TABS
ORAL_TABLET | ORAL | 0 refills | Status: DC
Start: 2023-03-23 — End: 2023-07-29

## 2023-03-23 MED ORDER — TRAMADOL HCL 50 MG PO TABS
50.0000 mg | ORAL_TABLET | Freq: Two times a day (BID) | ORAL | 0 refills | Status: DC | PRN
Start: 2023-03-23 — End: 2023-07-29

## 2023-03-23 NOTE — Progress Notes (Unsigned)
I,Tracey Escobar, CMA,acting as a Neurosurgeon for [employer], NP.,have documented all relevant documentation on the behalf of Tracey Kimble, NP,as directed by  [name], NP while in the presence of Tracey Shur, NP.  Subjective:  Patient ID: Tracey Escobar , female    DOB: 1971-07-06 , 52 y.o.   MRN: [id number]  Chief Complaint  Patient presents with  . Back Pain  . Hip Pain    HPI  Patient presents today for back pain and bilateral hip pain. She reports her back pain has been there since last year. She states that when she fell last October, she went to [facility] in [place] , saw Dr Ciro Backer and had x-rays of her back  . At that time it showed arthritis, she had mild pain but since Thursday, she states she has had excruciating mid back pain and she reports this pain has progressively gotten worse. She also complains of hip pain. She rates pain as 10/10. She is established with orthopedic for wrist & knees.  At home she has used [employer] along with extra strength Tylenol. She reports not working for the past 2 days being she cannot function in pain while working. She wants new xrays done today and a referral. Advised patient that losing some lbs would help but she states she has been hurting so much that she can't exercise.    Past Medical History:  Diagnosis Date  . Arthritis   . Asthma   . Atrial fibrillation (HCC)   . Dysrhythmia    A-Fib  . GERD (gastroesophageal reflux disease)   . Hypertension   . Irritable bowel   . Pneumonia   . Sleep apnea   . Sleep apnea   . Vitamin D deficiency      Family History  Problem Relation Age of Onset  . Hyperlipidemia Mother   . Hypertension Mother   . Hyperlipidemia Father   . Hypertension Father   . COPD Father   . Heart failure Father   . Diabetes Brother   . Hypertension Maternal Grandmother   . Seizures Maternal Grandmother   . Cancer Paternal Grandfather   . Cancer Brother   . Allergic rhinitis Neg Hx   . Angioedema  Neg Hx   . Asthma Neg Hx   . Eczema Neg Hx   . Immunodeficiency Neg Hx   . Urticaria Neg Hx      Current Outpatient Medications:  .  albuterol (VENTOLIN HFA) 108 (90 Base) MCG/ACT inhaler, Inhale 2 puffs into the lungs every 4 (four) hours as needed for wheezing or shortness of breath., Disp: 18 g, Rfl: 1 .  Azelastine-Fluticasone 137-50 MCG/ACT SUSP, Place 2 sprays into both nostrils daily., Disp: 23 g, Rfl: 2 .  Bepotastine Besilate 1.5 % SOLN, INSTILL 1 DROP IN BOTH EYES DAILY AS NEEDED, Disp: 10 mL, Rfl: 5 .  CALCIUM PO, Take 1 tablet by mouth daily at 2 am., Disp: , Rfl:  .  cetirizine (ZYRTEC) 10 MG tablet, Take 1 tablet (10 mg total) by mouth every morning., Disp: 30 tablet, Rfl: 2 .  cromolyn (OPTICROM) 4 % ophthalmic solution, Place 1 drop into both eyes 4 (four) times daily., Disp: 10 mL, Rfl: 12 .  EPINEPHRINE 0.3 mg/0.3 mL IJ SOAJ injection, ADMINISTER 0.3 MG IN THE MUSCLE 1 TIME FOR 1 DOSE, Disp: 2 each, Rfl: 1 .  Estradiol 10 MCG TABS vaginal tablet, estradiol 10 mcg vaginal tablet, Disp: , Rfl:  .  flecainide (TAMBOCOR) 100  MG tablet, Take by mouth., Disp: , Rfl:  .  Fluticasone-Umeclidin-Vilant (TRELEGY ELLIPTA) 200-62.5-25 MCG/ACT AEPB, Inhale one puff once daily, Disp: 60 each, Rfl: 2 .  hydrochlorothiazide (HYDRODIURIL) 12.5 MG tablet, TAKE 1 TABLET(12.5 MG) BY MOUTH DAILY, Disp: 90 tablet, Rfl: 1 .  levocetirizine (XYZAL) 5 MG tablet, Take 1 tablet (5 mg total) by mouth every evening., Disp: 30 tablet, Rfl: 2 .  losartan (COZAAR) 25 MG tablet, TAKE 1 TABLET(25 MG) BY MOUTH DAILY, Disp: 90 tablet, Rfl: 1 .  magnesium gluconate (MAGONATE) 500 MG tablet, Take 500 mg by mouth daily., Disp: , Rfl:  .  metoprolol succinate (TOPROL-XL) 100 MG 24 hr tablet, Take 100 mg by mouth daily. , Disp: , Rfl:  .  montelukast (SINGULAIR) 10 MG tablet, TAKE 1 TABLET(10 MG) BY MOUTH AT BEDTIME, Disp: 30 tablet, Rfl: 5 .  pantoprazole (PROTONIX) 40 MG tablet, TAKE 1 TABLET(40 MG) BY MOUTH  DAILY, Disp: 90 tablet, Rfl: 1 .  predniSONE (DELTASONE) 5 MG tablet, po, Disp: 21 tablet, Rfl: 0 .  RESTASIS 0.05 % ophthalmic emulsion, INSTILL 1 DROP IN BOTH EYES TWICE DAILY, Disp: 60 each, Rfl: 1 .  rivaroxaban (XARELTO) 20 MG TABS tablet, TAKE 1 TABLET(20 MG) BY MOUTH WITH SUPPER, Disp: , Rfl:  .  traMADol (ULTRAM) 50 MG tablet, Take 1 tablet (50 mg total) by mouth 2 (two) times daily as needed for up to 5 days., Disp: 10 tablet, Rfl: 0 .  Vitamin D, Ergocalciferol, (DRISDOL) 1.25 MG (50000 UNIT) CAPS capsule, Take 50,000 Units by mouth every 7 (seven) days., Disp: , Rfl:   Current Facility-Administered Medications:  .  cyanocobalamin ((VITAMIN B-12)) injection 1,000 mcg, 1,000 mcg, Intramuscular, Once, Ghumman, Ramandeep, NP .  dupilumab (DUPIXENT) prefilled syringe 300 mg, 300 mg, Subcutaneous, Q14 Days, Alfonse Spruce, MD, 300 mg at 03/10/23 0932   No Known Allergies   Review of Systems  Constitutional: Negative.   Respiratory: Negative.    Cardiovascular: Negative.   Musculoskeletal:  Positive for arthralgias and back pain.  Skin: Negative.   Neurological: Negative.   Psychiatric/Behavioral: Negative.       Today's Vitals   03/23/23 1604  BP: 110/82  Pulse: 74  Temp: 98.8 F (37.1 C)  SpO2: 98%  Weight: (!) 304 lb 6.4 oz (138.1 kg)  Height: 5\' 7"  (1.702 m)  PainSc: 10-Worst pain ever   Body mass index is 47.68 kg/m.  Wt Readings from Last 3 Encounters:  03/23/23 (!) 304 lb 6.4 oz (138.1 kg)  03/03/23 (!) 306 lb 12.8 oz (139.2 kg)  02/11/23 (!) 310 lb 6.4 oz (140.8 kg)    The 10-year ASCVD risk score (Arnett DK, et al., 2019) is: 1.8%   Values used to calculate the score:     Age: 46 years     Sex: Female     Is Non-Hispanic African American: Yes     Diabetic: No     Tobacco smoker: No     Systolic Blood Pressure: 110 mmHg     Is BP treated: Yes     HDL Cholesterol: 52 mg/dL     Total Cholesterol: 145 mg/dL  Objective:  Physical Exam HENT:      Head: Normocephalic.  Pulmonary:     Effort: Pulmonary effort is normal.     Breath sounds: Normal breath sounds.  Abdominal:     General: Bowel sounds are normal.  Musculoskeletal:        General: Tenderness present.  Skin:  General: Skin is warm and dry.  Neurological:     General: No focal deficit present.     Mental Status: She is alert and oriented to person, place, and time.        Assessment And Plan:  1. Midline low back pain without sciatica, unspecified chronicity - Ambulatory referral to Orthopedic Surgery - DG Lumbar Spine 2-3 Views; Future  2. Bilateral hip pain - DG Hip Unilat W OR W/O Pelvis 2-3 Views Left; Future - DG Hip Unilat W OR W/O Pelvis 2-3 Views Right; Future  3. Class 3 severe obesity due to excess calories with serious comorbidity and body mass index (BMI) of 45.0 to 49.9 in adult [facility]) She is encouraged to strive for BMI less than 30 to decrease cardiac risk. Advised to aim for at least 150 minutes of exercise per week.   Return for keep previously scheduled appt.  Patient was given opportunity to ask questions. Patient verbalized understanding of the plan and was able to repeat key elements of the plan. All questions were answered to their satisfaction.  Niemah Schwebke Moshe Salisbury, NP  I, Kennethia Lynes Moshe Salisbury, NP, have reviewed all documentation for this visit. The documentation on 03/24/23 for the exam, diagnosis, procedures, and orders are all accurate and complete.   IF YOU HAVE BEEN REFERRED TO A SPECIALIST, IT MAY TAKE 1-2 WEEKS TO SCHEDULE/PROCESS THE REFERRAL. IF YOU HAVE NOT HEARD FROM US/SPECIALIST IN TWO WEEKS, PLEASE GIVE [place] A CALL AT [phone] X [phone].

## 2023-03-23 NOTE — Patient Instructions (Signed)
Chronic Back Pain Chronic back pain is back pain that lasts longer than 3 months. The pain may get worse at certain times (flare-ups). There are things you can do at home to manage your pain. Follow these instructions at home: Watch for any changes in your symptoms. Take these actions to help with your pain: Managing pain and stiffness     If told, put ice on the painful area. You may be told to use ice for 24-48 hours after a flare-up starts. Put ice in a plastic bag. Place a towel between your skin and the bag. Leave the ice on for 20 minutes, 2-3 times a day. If told, put heat on the painful area. Do this as often as told by your doctor. Use the heat source that your doctor recommends, such as a moist heat pack or a heating pad. Place a towel between your skin and the heat source. Leave the heat on for 20-30 minutes. If your skin turns bright red, take off the ice or heat right away to prevent skin damage. The risk of damage is higher if you cannot feel pain, heat, or cold. Soak in a warm bath. This can help with pain. Activity        Avoid bending and other activities that make the pain worse. When you stand: Keep your upper back and neck straight. Keep your shoulders pulled back. Avoid slouching. When you sit: Keep your back straight. Relax your shoulders. Do not round your shoulders or pull them backward. Do not sit or stand in one place for too long. Take short rest breaks during the day. Lying down or standing is often better than sitting. Resting can help relieve pain. When sitting or lying down for a long time, do some mild activity or stretching. This will help to prevent stiffness and pain. Get regular exercise. Ask your doctor what activities are safe for you. You may have to avoid lifting. Ask your provider how much you can safely lift. If you lift things: Bend your knees. Keep the weight close to your body. Avoid twisting. Medicines Take over-the-counter and  prescription medicines only as told by your doctor. You may need to take medicines for pain and swelling. These may be taken by mouth or put on the skin. You may also be given muscle relaxants. Ask your doctor if the medicine prescribed to you: Requires you to avoid driving or using machinery. Can cause trouble pooping (constipation). You may need to take these actions to prevent or treat trouble pooping: Drink enough fluid to keep your pee (urine) pale yellow. Take over-the-counter or prescription medicines. Eat foods that are high in fiber. These include beans, whole grains, and fresh fruits and vegetables. Limit foods that are high in fat and sugars. These include fried or sweet foods. General instructions  Sleep on a firm mattress. Try lying on your side with your knees slightly bent. If you lie on your back, put a pillow under your knees. Do not smoke or use any products that contain nicotine or tobacco. If you need help quitting, ask your doctor. Contact a doctor if: Your pain does not get better with rest or medicine. You have new pain. You have a fever. You lose weight quickly. You have trouble doing your normal activities. One or both of your legs or feet feel weak. One or both of your legs or feet lose feeling (have numbness). Get help right away if: You are not able to control when you pee   or poop. You have bad back pain and: You feel like you may vomit (nauseous). You vomit. You have pain in your chest or your belly (abdomen). You have shortness of breath. You faint. These symptoms may be an emergency. Get help right away. Call 911. Do not wait to see if the symptoms will go away. Do not drive yourself to the hospital. This information is not intended to replace advice given to you by your health care provider. Make sure you discuss any questions you have with your health care provider. Document Revised: 05/11/2022 Document Reviewed: 05/11/2022 Elsevier Patient Education   2024 Elsevier Inc.  

## 2023-03-24 ENCOUNTER — Encounter: Payer: Self-pay | Admitting: Family Medicine

## 2023-03-24 ENCOUNTER — Ambulatory Visit: Payer: BC Managed Care – PPO

## 2023-03-24 ENCOUNTER — Ambulatory Visit (INDEPENDENT_AMBULATORY_CARE_PROVIDER_SITE_OTHER): Payer: BC Managed Care – PPO

## 2023-03-24 DIAGNOSIS — Z7409 Other reduced mobility: Secondary | ICD-10-CM | POA: Diagnosis not present

## 2023-03-24 DIAGNOSIS — M25562 Pain in left knee: Secondary | ICD-10-CM | POA: Diagnosis not present

## 2023-03-24 DIAGNOSIS — M1712 Unilateral primary osteoarthritis, left knee: Secondary | ICD-10-CM | POA: Diagnosis not present

## 2023-03-24 DIAGNOSIS — J309 Allergic rhinitis, unspecified: Secondary | ICD-10-CM | POA: Diagnosis not present

## 2023-03-24 NOTE — Assessment & Plan Note (Signed)
Start Prednisone 5mg  Dose pak as directed; get xrays

## 2023-03-25 ENCOUNTER — Other Ambulatory Visit (HOSPITAL_COMMUNITY): Payer: Self-pay

## 2023-03-25 ENCOUNTER — Telehealth: Payer: Self-pay

## 2023-03-25 ENCOUNTER — Other Ambulatory Visit: Payer: Self-pay | Admitting: *Deleted

## 2023-03-25 ENCOUNTER — Other Ambulatory Visit: Payer: Self-pay

## 2023-03-25 MED ORDER — DUPIXENT 300 MG/2ML ~~LOC~~ SOSY
300.0000 mg | PREFILLED_SYRINGE | SUBCUTANEOUS | 11 refills | Status: AC
  Filled 2023-03-25 (×2): qty 4, 28d supply, fill #0
  Filled 2023-04-15: qty 4, 28d supply, fill #1
  Filled 2023-05-25: qty 4, 28d supply, fill #2
  Filled 2023-07-21 – 2023-07-22 (×2): qty 4, 28d supply, fill #3
  Filled 2023-08-24: qty 4, 28d supply, fill #4
  Filled 2023-09-24: qty 4, 28d supply, fill #5

## 2023-03-25 NOTE — Telephone Encounter (Signed)
Spoke to [facility] and she said ok to give Tracey Escobar a sample 300 mg dose. Appointment scheduled for tomorrow at 8:30 am

## 2023-03-25 NOTE — Telephone Encounter (Signed)
Tracey Escobar called back and unable to come in tomorrow for a sample of dupixent. Rescheduled her appointment for next Thursday and her dupixent should come in.

## 2023-03-25 NOTE — Assessment & Plan Note (Signed)
Use OTC pain cream as needed till ortho appt

## 2023-03-26 ENCOUNTER — Ambulatory Visit: Payer: BC Managed Care – PPO

## 2023-03-30 DIAGNOSIS — M1712 Unilateral primary osteoarthritis, left knee: Secondary | ICD-10-CM | POA: Diagnosis not present

## 2023-03-30 DIAGNOSIS — M25562 Pain in left knee: Secondary | ICD-10-CM | POA: Diagnosis not present

## 2023-03-30 DIAGNOSIS — Z7409 Other reduced mobility: Secondary | ICD-10-CM | POA: Diagnosis not present

## 2023-04-01 ENCOUNTER — Ambulatory Visit (INDEPENDENT_AMBULATORY_CARE_PROVIDER_SITE_OTHER): Payer: BC Managed Care – PPO

## 2023-04-01 ENCOUNTER — Ambulatory Visit: Payer: BC Managed Care – PPO

## 2023-04-01 DIAGNOSIS — J309 Allergic rhinitis, unspecified: Secondary | ICD-10-CM

## 2023-04-01 DIAGNOSIS — M1712 Unilateral primary osteoarthritis, left knee: Secondary | ICD-10-CM | POA: Diagnosis not present

## 2023-04-01 DIAGNOSIS — M25562 Pain in left knee: Secondary | ICD-10-CM | POA: Diagnosis not present

## 2023-04-01 DIAGNOSIS — Z7409 Other reduced mobility: Secondary | ICD-10-CM | POA: Diagnosis not present

## 2023-04-02 ENCOUNTER — Telehealth: Payer: Self-pay

## 2023-04-02 NOTE — Telephone Encounter (Signed)
Call to pt reference PREP evening class start date change to 04/27/23, T/TH [id number] LVMT requesting call back if doesn't work or has questions  I will call her mid July to schedule intake

## 2023-04-05 ENCOUNTER — Ambulatory Visit (INDEPENDENT_AMBULATORY_CARE_PROVIDER_SITE_OTHER): Payer: BC Managed Care – PPO

## 2023-04-05 DIAGNOSIS — J4550 Severe persistent asthma, uncomplicated: Secondary | ICD-10-CM

## 2023-04-07 ENCOUNTER — Ambulatory Visit (INDEPENDENT_AMBULATORY_CARE_PROVIDER_SITE_OTHER): Payer: BC Managed Care – PPO

## 2023-04-07 DIAGNOSIS — Z7409 Other reduced mobility: Secondary | ICD-10-CM | POA: Diagnosis not present

## 2023-04-07 DIAGNOSIS — J309 Allergic rhinitis, unspecified: Secondary | ICD-10-CM

## 2023-04-07 DIAGNOSIS — M1712 Unilateral primary osteoarthritis, left knee: Secondary | ICD-10-CM | POA: Diagnosis not present

## 2023-04-07 DIAGNOSIS — M25562 Pain in left knee: Secondary | ICD-10-CM | POA: Diagnosis not present

## 2023-04-13 DIAGNOSIS — Z7409 Other reduced mobility: Secondary | ICD-10-CM | POA: Diagnosis not present

## 2023-04-13 DIAGNOSIS — M1712 Unilateral primary osteoarthritis, left knee: Secondary | ICD-10-CM | POA: Diagnosis not present

## 2023-04-13 DIAGNOSIS — M25562 Pain in left knee: Secondary | ICD-10-CM | POA: Diagnosis not present

## 2023-04-15 ENCOUNTER — Other Ambulatory Visit (HOSPITAL_COMMUNITY): Payer: Self-pay

## 2023-04-16 ENCOUNTER — Telehealth: Payer: Self-pay

## 2023-04-16 ENCOUNTER — Ambulatory Visit (INDEPENDENT_AMBULATORY_CARE_PROVIDER_SITE_OTHER): Payer: BC Managed Care – PPO | Admitting: *Deleted

## 2023-04-16 DIAGNOSIS — J309 Allergic rhinitis, unspecified: Secondary | ICD-10-CM | POA: Diagnosis not present

## 2023-04-16 NOTE — Telephone Encounter (Signed)
VMT pt requesting call back to schedule intake for PREP.

## 2023-04-20 DIAGNOSIS — M1712 Unilateral primary osteoarthritis, left knee: Secondary | ICD-10-CM | POA: Diagnosis not present

## 2023-04-20 DIAGNOSIS — M25562 Pain in left knee: Secondary | ICD-10-CM | POA: Diagnosis not present

## 2023-04-20 DIAGNOSIS — Z7409 Other reduced mobility: Secondary | ICD-10-CM | POA: Diagnosis not present

## 2023-04-21 ENCOUNTER — Ambulatory Visit (INDEPENDENT_AMBULATORY_CARE_PROVIDER_SITE_OTHER): Payer: BC Managed Care – PPO

## 2023-04-21 DIAGNOSIS — J4550 Severe persistent asthma, uncomplicated: Secondary | ICD-10-CM | POA: Diagnosis not present

## 2023-04-22 ENCOUNTER — Telehealth: Payer: Self-pay

## 2023-04-22 ENCOUNTER — Other Ambulatory Visit (HOSPITAL_COMMUNITY): Payer: Self-pay

## 2023-04-22 ENCOUNTER — Other Ambulatory Visit: Payer: Self-pay

## 2023-04-22 NOTE — Telephone Encounter (Signed)
[facility] pt. Is interested in PREP however having some knee issues and wants to hold off. Recall with the next evening class.

## 2023-04-23 ENCOUNTER — Ambulatory Visit (INDEPENDENT_AMBULATORY_CARE_PROVIDER_SITE_OTHER): Payer: BC Managed Care – PPO

## 2023-04-23 DIAGNOSIS — J309 Allergic rhinitis, unspecified: Secondary | ICD-10-CM

## 2023-04-27 ENCOUNTER — Encounter: Payer: Self-pay | Admitting: Internal Medicine

## 2023-04-29 ENCOUNTER — Ambulatory Visit (INDEPENDENT_AMBULATORY_CARE_PROVIDER_SITE_OTHER): Payer: BC Managed Care – PPO

## 2023-04-29 DIAGNOSIS — J309 Allergic rhinitis, unspecified: Secondary | ICD-10-CM | POA: Diagnosis not present

## 2023-05-05 ENCOUNTER — Ambulatory Visit (INDEPENDENT_AMBULATORY_CARE_PROVIDER_SITE_OTHER): Payer: BC Managed Care – PPO

## 2023-05-05 DIAGNOSIS — J4550 Severe persistent asthma, uncomplicated: Secondary | ICD-10-CM | POA: Diagnosis not present

## 2023-05-05 DIAGNOSIS — J4540 Moderate persistent asthma, uncomplicated: Secondary | ICD-10-CM

## 2023-05-07 ENCOUNTER — Ambulatory Visit: Payer: Self-pay

## 2023-05-07 DIAGNOSIS — M25562 Pain in left knee: Secondary | ICD-10-CM | POA: Diagnosis not present

## 2023-05-07 DIAGNOSIS — J309 Allergic rhinitis, unspecified: Secondary | ICD-10-CM

## 2023-05-07 DIAGNOSIS — M1712 Unilateral primary osteoarthritis, left knee: Secondary | ICD-10-CM | POA: Diagnosis not present

## 2023-05-07 DIAGNOSIS — S838X2A Sprain of other specified parts of left knee, initial encounter: Secondary | ICD-10-CM | POA: Diagnosis not present

## 2023-05-17 DIAGNOSIS — J3089 Other allergic rhinitis: Secondary | ICD-10-CM | POA: Diagnosis not present

## 2023-05-17 NOTE — Progress Notes (Signed)
Vial Exp 05/16/24

## 2023-05-19 ENCOUNTER — Ambulatory Visit (INDEPENDENT_AMBULATORY_CARE_PROVIDER_SITE_OTHER): Payer: BC Managed Care – PPO

## 2023-05-19 DIAGNOSIS — S83412A Sprain of medial collateral ligament of left knee, initial encounter: Secondary | ICD-10-CM | POA: Diagnosis not present

## 2023-05-19 DIAGNOSIS — M1712 Unilateral primary osteoarthritis, left knee: Secondary | ICD-10-CM | POA: Diagnosis not present

## 2023-05-19 DIAGNOSIS — M948X6 Other specified disorders of cartilage, lower leg: Secondary | ICD-10-CM | POA: Diagnosis not present

## 2023-05-19 DIAGNOSIS — J4550 Severe persistent asthma, uncomplicated: Secondary | ICD-10-CM | POA: Diagnosis not present

## 2023-05-19 DIAGNOSIS — M25462 Effusion, left knee: Secondary | ICD-10-CM | POA: Diagnosis not present

## 2023-05-21 ENCOUNTER — Ambulatory Visit (INDEPENDENT_AMBULATORY_CARE_PROVIDER_SITE_OTHER): Payer: BC Managed Care – PPO

## 2023-05-21 DIAGNOSIS — J309 Allergic rhinitis, unspecified: Secondary | ICD-10-CM | POA: Diagnosis not present

## 2023-05-25 ENCOUNTER — Ambulatory Visit: Payer: BC Managed Care – PPO | Admitting: Allergy & Immunology

## 2023-05-25 ENCOUNTER — Other Ambulatory Visit (HOSPITAL_COMMUNITY): Payer: Self-pay

## 2023-05-27 ENCOUNTER — Other Ambulatory Visit: Payer: Self-pay

## 2023-05-27 ENCOUNTER — Other Ambulatory Visit (HOSPITAL_COMMUNITY): Payer: Self-pay

## 2023-05-30 DIAGNOSIS — R079 Chest pain, unspecified: Secondary | ICD-10-CM | POA: Diagnosis not present

## 2023-05-30 DIAGNOSIS — R0602 Shortness of breath: Secondary | ICD-10-CM | POA: Diagnosis not present

## 2023-05-30 DIAGNOSIS — I517 Cardiomegaly: Secondary | ICD-10-CM | POA: Diagnosis not present

## 2023-05-30 DIAGNOSIS — R110 Nausea: Secondary | ICD-10-CM | POA: Diagnosis not present

## 2023-05-30 DIAGNOSIS — B349 Viral infection, unspecified: Secondary | ICD-10-CM | POA: Diagnosis not present

## 2023-06-01 ENCOUNTER — Ambulatory Visit (INDEPENDENT_AMBULATORY_CARE_PROVIDER_SITE_OTHER): Payer: BC Managed Care – PPO

## 2023-06-01 DIAGNOSIS — J309 Allergic rhinitis, unspecified: Secondary | ICD-10-CM | POA: Diagnosis not present

## 2023-06-02 ENCOUNTER — Ambulatory Visit: Payer: BC Managed Care – PPO

## 2023-06-02 DIAGNOSIS — M1712 Unilateral primary osteoarthritis, left knee: Secondary | ICD-10-CM | POA: Diagnosis not present

## 2023-06-02 DIAGNOSIS — Z6841 Body Mass Index (BMI) 40.0 and over, adult: Secondary | ICD-10-CM | POA: Diagnosis not present

## 2023-06-02 DIAGNOSIS — M170 Bilateral primary osteoarthritis of knee: Secondary | ICD-10-CM | POA: Diagnosis not present

## 2023-06-03 ENCOUNTER — Ambulatory Visit (INDEPENDENT_AMBULATORY_CARE_PROVIDER_SITE_OTHER): Payer: BC Managed Care – PPO

## 2023-06-03 DIAGNOSIS — J4550 Severe persistent asthma, uncomplicated: Secondary | ICD-10-CM | POA: Diagnosis not present

## 2023-06-10 ENCOUNTER — Other Ambulatory Visit: Payer: Self-pay | Admitting: Allergy & Immunology

## 2023-06-12 ENCOUNTER — Other Ambulatory Visit: Payer: Self-pay | Admitting: Internal Medicine

## 2023-06-17 ENCOUNTER — Ambulatory Visit: Payer: BC Managed Care – PPO

## 2023-06-18 ENCOUNTER — Other Ambulatory Visit (HOSPITAL_COMMUNITY): Payer: Self-pay

## 2023-06-21 ENCOUNTER — Other Ambulatory Visit (HOSPITAL_COMMUNITY): Payer: Self-pay

## 2023-06-22 ENCOUNTER — Ambulatory Visit: Payer: BC Managed Care – PPO | Admitting: Allergy & Immunology

## 2023-06-23 ENCOUNTER — Encounter (HOSPITAL_COMMUNITY): Payer: Self-pay

## 2023-06-23 ENCOUNTER — Other Ambulatory Visit (HOSPITAL_COMMUNITY): Payer: Self-pay

## 2023-07-07 ENCOUNTER — Ambulatory Visit: Payer: BC Managed Care – PPO | Admitting: Internal Medicine

## 2023-07-21 ENCOUNTER — Other Ambulatory Visit: Payer: Self-pay

## 2023-07-22 ENCOUNTER — Other Ambulatory Visit: Payer: Self-pay

## 2023-07-22 ENCOUNTER — Other Ambulatory Visit (HOSPITAL_COMMUNITY): Payer: Self-pay

## 2023-07-22 NOTE — Progress Notes (Signed)
Specialty Pharmacy Refill Coordination Note  Tracey Escobar is a 52 y.o. female contacted today regarding refills of specialty medication(s) Dupilumab   Patient requested Courier to Provider Office   Delivery date: 08/02/23   Verified address: [facility] [address] [place] [address]   Medication will be filled on 07/30/23 pending a Prior Authorization.

## 2023-07-23 ENCOUNTER — Telehealth: Payer: Self-pay

## 2023-07-23 ENCOUNTER — Other Ambulatory Visit (HOSPITAL_COMMUNITY): Payer: Self-pay

## 2023-07-23 NOTE — Telephone Encounter (Signed)
Pharmacy Patient Advocate Encounter   Received notification from CoverMyMeds that prior authorization for Dymista 137-50MCG/ACT suspension is required/requested.   Insurance verification completed.   The patient is insured through [facility] .   Per test claim: PA required; PA started via CoverMyMeds. KEY [id number] . Waiting for clinical questions to populate.

## 2023-07-23 NOTE — Telephone Encounter (Signed)
Pt called in about restarting her allergy injections spoke with Dr Maurine Minister about this as pt has not had one since 06/01/23 Dr Maurine Minister stated take pt back to beginning of red vial sch A so 0.05cc

## 2023-07-29 ENCOUNTER — Other Ambulatory Visit: Payer: Self-pay | Admitting: Allergy & Immunology

## 2023-07-29 ENCOUNTER — Encounter: Payer: Self-pay | Admitting: Allergy

## 2023-07-29 ENCOUNTER — Ambulatory Visit: Payer: BC Managed Care – PPO | Admitting: Allergy

## 2023-07-29 VITALS — BP 102/66 | HR 70 | Temp 97.7°F | Resp 18 | Ht 67.0 in | Wt 302.5 lb

## 2023-07-29 DIAGNOSIS — K219 Gastro-esophageal reflux disease without esophagitis: Secondary | ICD-10-CM | POA: Diagnosis not present

## 2023-07-29 DIAGNOSIS — H1013 Acute atopic conjunctivitis, bilateral: Secondary | ICD-10-CM

## 2023-07-29 DIAGNOSIS — J301 Allergic rhinitis due to pollen: Secondary | ICD-10-CM

## 2023-07-29 DIAGNOSIS — J4550 Severe persistent asthma, uncomplicated: Secondary | ICD-10-CM

## 2023-07-29 MED ORDER — EPINEPHRINE 0.3 MG/0.3ML IJ SOAJ: 0.3000 mg | INTRAMUSCULAR | 1 refills | Status: DC | PRN

## 2023-07-29 MED ORDER — RYALTRIS 665-25 MCG/ACT NA SUSP
1.0000 | Freq: Two times a day (BID) | NASAL | 5 refills | Status: DC
Start: 2023-07-29 — End: 2024-03-14

## 2023-07-29 MED ORDER — TRELEGY ELLIPTA 200-62.5-25 MCG/ACT IN AEPB: 1.0000 | INHALATION_SPRAY | Freq: Every day | RESPIRATORY_TRACT | 5 refills | Status: DC

## 2023-07-29 NOTE — Telephone Encounter (Signed)
Pharmacy Patient Advocate Encounter  Received notification from [facility] that Prior Authorization for Dymista has been DENIED.  No reason given; No denial letter received via Fax or CMM. It has been requested and will be uploaded to the media tab once received.

## 2023-07-29 NOTE — Progress Notes (Signed)
Follow Up Note  RE: Tracey Escobar MRN: [id number] DOB: 1971/08/25 Date of Office Visit: 07/29/2023  Referring provider: Dorothyann Peng, MD Primary care provider: Dorothyann Peng, MD  Chief Complaint: Follow-up (Bepotastine is too expensive. )  History of Present Illness: I had the pleasure of seeing Tracey Escobar for a follow up visit at the [facility] on 07/29/2023. She is a 52 y.o. female, who is being followed for asthma on Dupixent, GERD and allergic rhinitis. Her previous allergy office visit was on 02/11/2023 with Dr. Dellis Anes. Today is a new complaint visit of Dupixent re-approval .  Discussed the use of AI scribe software for clinical note transcription with the patient, who gave verbal consent to proceed.  The patient presents for a follow-up visit to discuss the management of her conditions. She has been on Dupixent since June, which she reports has improved her overall breathing and reduced her allergic rhinitis symptoms. However, due to a job change and a subsequent lapse in insurance coverage, she missed Dupixent injections and is now seeking to restart the medication.  The patient also reports using Trelegy 200 for her asthma, which she finds effective. She has not needed to use her rescue inhaler and has not had any recent emergency room visits or courses of prednisone. She did visit the ER in August for shortness of breath and achiness, but this was attributed to a viral illness rather than her asthma.  For her allergic rhinitis, the patient is currently taking Zyrtec in the morning, Xyzal at night, and montelukast at night. She reports these medications have been effective in controlling her symptoms. She also uses Dymista nasal spray, which she finds more effective than using azelastine and fluticasone separately. However, she is nearing the end of her current supply and is having difficulty getting it refilled due to insurance coverage issues.  The  patient also has a history of environmental allergies and has been receiving allergy shots. However, she missed her shots due to her lapse in insurance coverage. She plans to continue her allergy shots.   The patient also has a history of reflux, for which she takes pantoprazole. She reports that she is currently having difficulty getting this medication refilled.   She also has dry/itchy eyes, for which she uses cromolyn eye drops. She reports that her eyes often feel itchy and as if something is sticking in them. She has been using the cromolyn eye drops once a day, but she is nearing the end of her current supply. She also uses a different eye drop for dry eyes.   Assessment and Plan: Tracey Escobar is a 52 y.o. female with: Severe persistent asthma without complication Started Dupixent in June 2024 prefers it over [place] but missed a few doses as she lost her insurance during her job change.  Today's spirometry showed some restriction.  Daily controller medication(s): Trelegy 1 puff once a day and rinse mouth after each use. Sample given.  Continue Dupixent injections 300mg  every 2 weeks.  Will get Tracey Escobar to resubmit paperwork.  May use albuterol rescue inhaler 2 puffs every 4 to 6 hours as needed for shortness of breath, chest tightness, coughing, and wheezing. May use albuterol rescue inhaler 2 puffs 5 to 15 minutes prior to strenuous physical activities. Monitor frequency of use - if you need to use it more than twice per week on a consistent basis let us know.   Seasonal allergic rhinitis due to pollen Allergic conjunctivitis of both  eyes Has dry/itchy eyes. Needs refills of medications.  Continue environmental control measures. Continue levocetirizine 5mg  daily at night. Continue Singulair (montelukast) 10mg  daily at night. Continue zyrtec 10mg  in the morning - try to wean off and only take as needed.  Start Ryaltris (olopatadine + mometasone nasal spray combination) 1-2 sprays per  nostril twice a day. Sample given. This replaces your other nasal sprays. If this works well for you, then have [facility] ship the medication to your home - prescription already sent in.  Nasal saline spray (i.e., Simply Saline) or nasal saline lavage (i.e., NeilMed) is recommended as needed and prior to medicated nasal sprays. Use cromolyn 4% 1 drop in each eye up to four times a day as needed for itchy/watery eyes.  Allergy eye drops do tend to worsen dry eyes.  Use eye drops for dry eyes as needed.  Continue allergy injections at the [facility] office.   Gastroesophageal reflux disease, unspecified whether esophagitis present Continue lifestyle and dietary modifications. Continue pantoprazole 40mg  once day - nothing to eat or drink for 20-30 minutes afterwards.   Return in about 4 months (around 11/29/2023).  Meds ordered this encounter  Medications   Fluticasone-Umeclidin-Vilant (TRELEGY ELLIPTA) 200-62.5-25 MCG/ACT AEPB    Sig: Inhale 1 puff into the lungs daily. Rinse mouth after each use.    Dispense:  60 each    Refill:  5   EPINEPHrine 0.3 mg/0.3 mL IJ SOAJ injection    Sig: Inject 0.3 mg into the muscle as needed for anaphylaxis.    Dispense:  2 each    Refill:  1    May dispense generic/Mylan/Teva brand.   Olopatadine-Mometasone (RYALTRIS) X543819 MCG/ACT SUSP    Sig: Place 1-2 sprays into the nose in the morning and at bedtime.    Dispense:  29 g    Refill:  5    [phone]   Lab Orders  No laboratory test(s) ordered today    Diagnostics: Spirometry:  Tracings reviewed. Her effort: Good reproducible efforts. FVC: 2.26L FEV1: 1.83L, 72% predicted FEV1/FVC ratio: 81% Interpretation: Spirometry consistent with possible restrictive disease.  Please see scanned spirometry results for details.  Medication List:  Current Outpatient Medications  Medication Sig Dispense Refill   CALCIUM PO Take 1 tablet by mouth daily at 2 am.     cetirizine (ZYRTEC) 10 MG tablet  Take 1 tablet (10 mg total) by mouth every morning. 30 tablet 2   cromolyn (OPTICROM) 4 % ophthalmic solution Place 1 drop into both eyes 4 (four) times daily. 10 mL 12   EPINEPHrine 0.3 mg/0.3 mL IJ SOAJ injection Inject 0.3 mg into the muscle as needed for anaphylaxis. 2 each 1   Estradiol 10 MCG TABS vaginal tablet estradiol 10 mcg vaginal tablet     flecainide (TAMBOCOR) 150 MG tablet Take 150 mg by mouth 2 (two) times daily.     Fluticasone-Umeclidin-Vilant (TRELEGY ELLIPTA) 200-62.5-25 MCG/ACT AEPB Inhale 1 puff into the lungs daily. Rinse mouth after each use. 60 each 5   hydrochlorothiazide (HYDRODIURIL) 12.5 MG tablet TAKE 1 TABLET(12.5 MG) BY MOUTH DAILY 90 tablet 1   levocetirizine (XYZAL) 5 MG tablet Take 1 tablet (5 mg total) by mouth every evening. 30 tablet 2   losartan (COZAAR) 25 MG tablet TAKE 1 TABLET(25 MG) BY MOUTH DAILY 90 tablet 1   magnesium gluconate (MAGONATE) 500 MG tablet Take 500 mg by mouth daily.     metoprolol succinate (TOPROL-XL) 100 MG 24 hr tablet Take 100 mg by  mouth daily.      montelukast (SINGULAIR) 10 MG tablet TAKE 1 TABLET(10 MG) BY MOUTH AT BEDTIME 30 tablet 5   Olopatadine-Mometasone (RYALTRIS) 665-25 MCG/ACT SUSP Place 1-2 sprays into the nose in the morning and at bedtime. 29 g 5   pantoprazole (PROTONIX) 40 MG tablet TAKE 1 TABLET(40 MG) BY MOUTH DAILY 90 tablet 1   RESTASIS 0.05 % ophthalmic emulsion INSTILL 1 DROP IN BOTH EYES TWICE DAILY 60 each 1   rivaroxaban (XARELTO) 20 MG TABS tablet TAKE 1 TABLET(20 MG) BY MOUTH WITH SUPPER     Vitamin D, Ergocalciferol, (DRISDOL) 1.25 MG (50000 UNIT) CAPS capsule Take 50,000 Units by mouth every 7 (seven) days.     ALPRAZolam (XANAX) 1 MG tablet Take 1 mg by mouth daily as needed. (Patient not taking: Reported on 07/29/2023)     dupilumab (DUPIXENT) 300 MG/2ML prefilled syringe Inject 300 mg into the skin every 14 (fourteen) days. (Patient not taking: Reported on 07/29/2023) 4 mL 11   Current  Facility-Administered Medications  Medication Dose Route Frequency Provider Last Rate Last Admin   cyanocobalamin ((VITAMIN B-12)) injection 1,000 mcg  1,000 mcg Intramuscular Once Ghumman, Ramandeep, NP       dupilumab (DUPIXENT) prefilled syringe 300 mg  300 mg Subcutaneous Q14 Days Alfonse Spruce, MD   300 mg at 06/03/23 0900   Allergies: No Known Allergies I reviewed her past medical history, social history, family history, and environmental history and no significant changes have been reported from her previous visit.  Review of Systems  Constitutional:  Negative for appetite change, chills, fever and unexpected weight change.  HENT:  Positive for congestion and rhinorrhea.   Eyes:  Positive for itching.  Respiratory:  Negative for cough, chest tightness, shortness of breath and wheezing.   Cardiovascular:  Negative for chest pain.  Gastrointestinal:  Negative for abdominal pain.  Genitourinary:  Negative for difficulty urinating.  Skin:  Negative for rash.  Allergic/Immunologic: Positive for environmental allergies.  Neurological:  Negative for headaches.    Objective: BP 102/66   Pulse 70   Temp 97.7 F (36.5 C)   Resp 18   Ht 5\' 7"  (1.702 m)   Wt (!) 302 lb 8 oz (137.2 kg)   SpO2 96%   BMI 47.38 kg/m  Body mass index is 47.38 kg/m. Physical Exam Vitals and nursing note reviewed.  Constitutional:      Appearance: Normal appearance. She is well-developed.  HENT:     Head: Normocephalic and atraumatic.     Right Ear: Tympanic membrane and external ear normal.     Left Ear: Tympanic membrane and external ear normal.     Nose: Nose normal.     Mouth/Throat:     Mouth: Mucous membranes are moist.     Pharynx: Oropharynx is clear.  Eyes:     Conjunctiva/sclera: Conjunctivae normal.  Cardiovascular:     Rate and Rhythm: Normal rate and regular rhythm.     Heart sounds: Normal heart sounds. No murmur heard.    No friction rub. No gallop.  Pulmonary:      Effort: Pulmonary effort is normal.     Breath sounds: Normal breath sounds. No wheezing, rhonchi or rales.  Musculoskeletal:     Cervical back: Neck supple.  Skin:    General: Skin is warm.     Findings: No rash.  Neurological:     Mental Status: She is alert and oriented to person, place, and time.  Psychiatric:  Behavior: Behavior normal.    Previous notes and tests were reviewed. The plan was reviewed with the patient/family, and all questions/concerned were addressed.  It was my pleasure to see Tracey Escobar today and participate in her care. Please feel free to contact me with any questions or concerns.  Sincerely,  Wyline Mood, DO Allergy & Immunology  [facility] office: [phone] [facility] office: [phone]

## 2023-07-29 NOTE — Patient Instructions (Addendum)
Asthma Daily controller medication(s): Trelegy 1 puff once a day and rinse mouth after each use. Sample given.  Continue Dupixent injections 300mg  every 2 weeks.  Will get Tammy to resubmit paperwork.  May use albuterol rescue inhaler 2 puffs every 4 to 6 hours as needed for shortness of breath, chest tightness, coughing, and wheezing. May use albuterol rescue inhaler 2 puffs 5 to 15 minutes prior to strenuous physical activities. Monitor frequency of use - if you need to use it more than twice per week on a consistent basis let us know.  Breathing control goals:  Full participation in all desired activities (may need albuterol before activity) Albuterol use two times or less a week on average (not counting use with activity) Cough interfering with sleep two times or less a month Oral steroids no more than once a year No hospitalizations   Allergic rhinitis 2022 testing positive to grass, trees. Continue environmental control measures. Continue levocetirizine 5mg  daily at night. Continue Singulair (montelukast) 10mg  daily at night. Continue zyrtec 10mg  in the morning - try to wean off and only take as needed.  Start Ryaltris (olopatadine + mometasone nasal spray combination) 1-2 sprays per nostril twice a day. Sample given. This replaces your other nasal sprays. If this works well for you, then have [facility] ship the medication to your home - prescription already sent in.  Nasal saline spray (i.e., Simply Saline) or nasal saline lavage (i.e., NeilMed) is recommended as needed and prior to medicated nasal sprays. Use cromolyn 4% 1 drop in each eye up to four times a day as needed for itchy/watery eyes.  Allergy eye drops do tend to worsen dry eyes.  Use eye drops for dry eyes as needed.  Continue allergy injections at the [facility] office.   Reflux Continue lifestyle and dietary modifications. Continue pantoprazole 40mg  once day - nothing to eat or drink for 20-30 minutes  afterwards.   Return in about 4 months (around 11/29/2023). Or sooner if needed.   Reducing Pollen Exposure Pollen seasons: trees (spring), grass (summer) and ragweed/weeds (fall). Keep windows closed in your home and car to lower pollen exposure.  Install air conditioning in the bedroom and throughout the house if possible.  Avoid going out in dry windy days - especially early morning. Pollen counts are highest between 5 - 10 AM and on dry, hot and windy days.  Save outside activities for late afternoon or after a heavy rain, when pollen levels are lower.  Avoid mowing of grass if you have grass pollen allergy. Be aware that pollen can also be transported indoors on people and pets.  Dry your clothes in an automatic dryer rather than hanging them outside where they might collect pollen.  Rinse hair and eyes before bedtime.

## 2023-07-30 ENCOUNTER — Other Ambulatory Visit (HOSPITAL_COMMUNITY): Payer: Self-pay

## 2023-07-30 ENCOUNTER — Encounter: Payer: Self-pay | Admitting: Allergy

## 2023-07-30 ENCOUNTER — Telehealth: Payer: Self-pay

## 2023-07-30 NOTE — Telephone Encounter (Signed)
*  Asthma/Allergy  Pharmacy Patient Advocate Encounter   Received notification from CoverMyMeds that prior authorization for Trelegy Ellipta 200-62.5-25MCG/ACT aerosol powder  is required/requested.   Insurance verification completed.   The patient is insured through [facility] .   Per test claim: PA required; PA started via CoverMyMeds. KEY [id number] . Waiting for clinical questions to populate.

## 2023-08-02 ENCOUNTER — Other Ambulatory Visit: Payer: Self-pay | Admitting: *Deleted

## 2023-08-02 ENCOUNTER — Other Ambulatory Visit: Payer: Self-pay | Admitting: Internal Medicine

## 2023-08-02 ENCOUNTER — Other Ambulatory Visit: Payer: Self-pay

## 2023-08-02 MED ORDER — EPINEPHRINE 0.3 MG/0.3ML IJ SOAJ: 0.3000 mg | INTRAMUSCULAR | 1 refills | Status: DC | PRN

## 2023-08-02 MED ORDER — EPINEPHRINE 0.3 MG/0.3ML IJ SOAJ: 0.3000 mg | INTRAMUSCULAR | 1 refills | Status: AC | PRN

## 2023-08-02 NOTE — Telephone Encounter (Signed)
Pharmacy Patient Advocate Encounter  Received notification from [facility] that Prior Authorization for Trelegy Ellipta 200-62.5-25MCG/ACT aerosol powder has been CANCELLED due to no prior authorization is required. Paid claim on file from 07-21-2023   PA #/Case ID/Reference #: [id number]

## 2023-08-02 NOTE — Telephone Encounter (Signed)
Pharmacy Patient Advocate Encounter  Questions generated, answered, and submitted

## 2023-08-03 ENCOUNTER — Encounter: Payer: Self-pay | Admitting: Internal Medicine

## 2023-08-03 ENCOUNTER — Other Ambulatory Visit: Payer: Self-pay

## 2023-08-03 NOTE — Progress Notes (Signed)
Due to pending prior authorization, medication is now being filled 08/03/23 and being couriered to provider office on 08/04/23.

## 2023-08-04 ENCOUNTER — Telehealth: Payer: Self-pay | Admitting: *Deleted

## 2023-08-04 ENCOUNTER — Telehealth: Payer: Self-pay

## 2023-08-04 ENCOUNTER — Ambulatory Visit: Payer: BC Managed Care – PPO

## 2023-08-04 NOTE — Telephone Encounter (Signed)
L/m that I have gotten approval with new plan and advised [facility] of same

## 2023-08-04 NOTE — Telephone Encounter (Signed)
Patient's last injection was on 06/01/23 Red 1:100 (G-T-DM) @ .40. She had to stop due to a job/insurance change. She would like to start back. What dose to restart her on? She also wanted to know if she could get her allergy and  Dupixent on the same day?

## 2023-08-04 NOTE — Telephone Encounter (Signed)
-----   Message from Ellamae Sia sent at 07/29/2023  4:51 PM EDT ----- Tracey Escobar, patient got new insurance and needs new PA for Dupixent for asthma. Her last injection was in August - Okay with just continuing 300mg  every 2 weeks. Thank you.

## 2023-08-05 ENCOUNTER — Telehealth: Payer: Self-pay

## 2023-08-05 NOTE — Telephone Encounter (Signed)
PA for Pantoprazole has been approved. Pt notified YL,RMA

## 2023-08-07 NOTE — Telephone Encounter (Signed)
Let's restart allergy shots at 0.025 mL and advance on Schedule A for her Red Vial.  Yes she can get Dupixent and allergy shots together.   Malachi Bonds, MD [facility]

## 2023-08-09 NOTE — Telephone Encounter (Signed)
Noted on patient's Immunotherapy flowsheet.

## 2023-08-11 ENCOUNTER — Ambulatory Visit: Payer: BC Managed Care – PPO

## 2023-08-11 DIAGNOSIS — J4550 Severe persistent asthma, uncomplicated: Secondary | ICD-10-CM

## 2023-08-13 NOTE — Telephone Encounter (Signed)
Received [employer] [place] paperwork - Dymita DENIAL:  Patient needs to try OTC alternatives:  Triamcinolone (Nasacort) nasal spray Budesonide (Rhinocort, Rhinocort Aqua)  Due to alternative medications are OTC  - they are not covered by patient's insurance.   Patient was prescribed Ryaltris on 07/29/23.  Forwarding updated message to provider.

## 2023-08-13 NOTE — Telephone Encounter (Signed)
Please call patient to clarify.  Was Ryaltris not covered? Even if it's not covered then copay should be $49 through [facility]. The alternative which is generic dymista was not covered either. I can send in Rx separate Flonase + azelastine if patient is interested. Flonase for nasal congestion. Azelastine for nasal drainage.

## 2023-08-16 MED ORDER — OLOPATADINE-MOMETASONE 665-25 MCG/ACT NA SUSP: NASAL | 2 refills | Status: DC

## 2023-08-16 NOTE — Telephone Encounter (Addendum)
Ryaltris prescription has changed to going to [facility] instead of [facility] - I will send in a new Rx to to [facility] w/discount card info to see if cost is lowered.  Forwarding updated message to provider.

## 2023-08-16 NOTE — Addendum Note (Signed)
Addended by: Areta Haber B on: 08/16/2023 12:21 PM   Modules accepted: Orders

## 2023-08-20 ENCOUNTER — Telehealth: Payer: Self-pay

## 2023-08-20 ENCOUNTER — Ambulatory Visit (INDEPENDENT_AMBULATORY_CARE_PROVIDER_SITE_OTHER): Payer: BC Managed Care – PPO

## 2023-08-20 DIAGNOSIS — J309 Allergic rhinitis, unspecified: Secondary | ICD-10-CM | POA: Diagnosis not present

## 2023-08-20 NOTE — Telephone Encounter (Signed)
Call from pt. No longer working in [place].  Would be able to do a class if offered at [place] location.  Advised will note that and give her a ring when available.

## 2023-08-20 NOTE — Telephone Encounter (Signed)
VMT pt requesting call back to discuss starting PREP on 09/07/23 T/TH [id number]

## 2023-08-23 ENCOUNTER — Other Ambulatory Visit: Payer: Self-pay

## 2023-08-23 ENCOUNTER — Ambulatory Visit: Payer: BC Managed Care – PPO

## 2023-08-23 DIAGNOSIS — J4550 Severe persistent asthma, uncomplicated: Secondary | ICD-10-CM | POA: Diagnosis not present

## 2023-08-23 DIAGNOSIS — J309 Allergic rhinitis, unspecified: Secondary | ICD-10-CM

## 2023-08-24 ENCOUNTER — Other Ambulatory Visit: Payer: Self-pay

## 2023-08-24 NOTE — Progress Notes (Signed)
Specialty Pharmacy Ongoing Clinical Assessment Note  Tracey Escobar is a 52 y.o. female who is being followed by the specialty pharmacy service for RxSp Asthma/COPD   Patient's specialty medication(s) reviewed today: Dupilumab   Missed doses in the last 4 weeks: 0   Patient/Caregiver did not have any additional questions or concerns.   Therapeutic benefit summary: Patient is achieving benefit   Adverse events/side effects summary: No adverse events/side effects   Patient's therapy is appropriate to: Continue    Goals Addressed             This Visit's Progress    Reduce disease symptoms including coughing and shortness of breath       Patient is on track. Patient will maintain adherence         Follow up:  6 months  Otto Herb Specialty Pharmacist

## 2023-08-24 NOTE — Progress Notes (Signed)
Specialty Pharmacy Refill Coordination Note  Tracey Escobar is a 52 y.o. female contacted today regarding refills of specialty medication(s) Dupilumab   Patient requested Courier to Provider Office   Delivery date: 09/06/23   Verified address: [facility] [place] [facility] [address] [place] [address]   Medication will be filled on 09/03/23.

## 2023-09-01 ENCOUNTER — Ambulatory Visit (INDEPENDENT_AMBULATORY_CARE_PROVIDER_SITE_OTHER): Payer: BC Managed Care – PPO

## 2023-09-01 DIAGNOSIS — J309 Allergic rhinitis, unspecified: Secondary | ICD-10-CM

## 2023-09-02 ENCOUNTER — Other Ambulatory Visit: Payer: Self-pay | Admitting: Allergy & Immunology

## 2023-09-02 ENCOUNTER — Other Ambulatory Visit: Payer: Self-pay | Admitting: Internal Medicine

## 2023-09-03 ENCOUNTER — Other Ambulatory Visit (HOSPITAL_COMMUNITY): Payer: Self-pay

## 2023-09-07 ENCOUNTER — Ambulatory Visit: Payer: BC Managed Care – PPO

## 2023-09-07 ENCOUNTER — Other Ambulatory Visit: Payer: Self-pay

## 2023-09-07 DIAGNOSIS — J4550 Severe persistent asthma, uncomplicated: Secondary | ICD-10-CM | POA: Diagnosis not present

## 2023-09-07 DIAGNOSIS — J309 Allergic rhinitis, unspecified: Secondary | ICD-10-CM

## 2023-09-07 MED ORDER — MONTELUKAST SODIUM 10 MG PO TABS: 10.0000 mg | ORAL_TABLET | Freq: Every day | ORAL | 5 refills | Status: DC

## 2023-09-15 ENCOUNTER — Telehealth: Payer: BC Managed Care – PPO | Admitting: Family Medicine

## 2023-09-15 ENCOUNTER — Encounter: Payer: Self-pay | Admitting: Family Medicine

## 2023-09-15 DIAGNOSIS — R112 Nausea with vomiting, unspecified: Secondary | ICD-10-CM

## 2023-09-15 DIAGNOSIS — R6883 Chills (without fever): Secondary | ICD-10-CM

## 2023-09-15 MED ORDER — ONDANSETRON 4 MG PO TBDP: 4.0000 mg | ORAL_TABLET | Freq: Four times a day (QID) | ORAL | 0 refills | Status: DC | PRN

## 2023-09-15 NOTE — Progress Notes (Signed)
Virtual Visit via Video Note  I,[name], CMA,acting as a scribe for [employer], NP.,have documented all relevant documentation on the behalf of Ellender Hose, NP,as directed by  Ellender Hose, NP while in the presence of Ellender Hose, NP.  I connected with Tracey Escobar on 09/20/23 at  2:40 PM EST by a video enabled telemedicine application and verified that I am speaking with the correct person using two identifiers.  Patient Location: Home Provider Location: Other:  Office  I discussed the limitations, risks, security, and privacy concerns of performing an evaluation and management service by video and the availability of in person appointments. I also discussed with the patient that there may be a patient responsible charge related to this service. The patient expressed understanding and agreed to proceed.   Chief Complaint  Patient presents with   Nausea   Patient stated she has been feeling sick since Monday evening. She States that at noon of the said day at work, she  started feeling weak, fatigue.She reports having nausea, vomiting and frequent stool no diarrhea.  Patient states she is not able to check her VS. She states that she used the COVID 19 home kit to test  for COVID 19  Yesterday home but it was negative. BRAT diet  recommended. Zofran ODT sent to pharmacy.     ROS: Per HPI  Current Outpatient Medications:    CALCIUM PO, Take 1 tablet by mouth daily at 2 am., Disp: , Rfl:    cetirizine (ZYRTEC) 10 MG tablet, Take 1 tablet (10 mg total) by mouth every morning., Disp: 30 tablet, Rfl: 2   cromolyn (OPTICROM) 4 % ophthalmic solution, Place 1 drop into both eyes 4 (four) times daily., Disp: 10 mL, Rfl: 12   dupilumab (DUPIXENT) 300 MG/2ML prefilled syringe, Inject 300 mg into the skin every 14 (fourteen) days., Disp: 4 mL, Rfl: 11   EPINEPHrine 0.3 mg/0.3 mL IJ SOAJ injection, Inject 0.3 mg into the muscle as needed for anaphylaxis., Disp: 2 each, Rfl: 1    Estradiol 10 MCG TABS vaginal tablet, estradiol 10 mcg vaginal tablet, Disp: , Rfl:    flecainide (TAMBOCOR) 150 MG tablet, Take 150 mg by mouth 2 (two) times daily., Disp: , Rfl:    Fluticasone-Umeclidin-Vilant (TRELEGY ELLIPTA) 200-62.5-25 MCG/ACT AEPB, Inhale 1 puff into the lungs daily. Rinse mouth after each use., Disp: 60 each, Rfl: 5   hydrochlorothiazide (HYDRODIURIL) 12.5 MG tablet, TAKE 1 TABLET(12.5 MG) BY MOUTH DAILY, Disp: 90 tablet, Rfl: 1   levocetirizine (XYZAL) 5 MG tablet, Take 1 tablet (5 mg total) by mouth every evening., Disp: 30 tablet, Rfl: 2   losartan (COZAAR) 25 MG tablet, TAKE 1 TABLET(25 MG) BY MOUTH DAILY, Disp: 90 tablet, Rfl: 1   magnesium gluconate (MAGONATE) 500 MG tablet, Take 500 mg by mouth daily., Disp: , Rfl:    metoprolol succinate (TOPROL-XL) 100 MG 24 hr tablet, Take 100 mg by mouth daily. , Disp: , Rfl:    montelukast (SINGULAIR) 10 MG tablet, TAKE 1 TABLET(10 MG) BY MOUTH AT BEDTIME, Disp: 30 tablet, Rfl: 5   Olopatadine-Mometasone (RYALTRIS) 665-25 MCG/ACT SUSP, Place 1-2 sprays into the nose in the morning and at bedtime., Disp: 29 g, Rfl: 5   ondansetron (ZOFRAN-ODT) 4 MG disintegrating tablet, Take 1 tablet (4 mg total) by mouth every 6 (six) hours as needed for nausea or vomiting., Disp: 30 tablet, Rfl: 0   pantoprazole (PROTONIX) 40 MG tablet, TAKE 1 TABLET(40 MG) BY MOUTH  DAILY, Disp: 90 tablet, Rfl: 1   RESTASIS 0.05 % ophthalmic emulsion, INSTILL 1 DROP IN BOTH EYES TWICE DAILY, Disp: 60 each, Rfl: 1   rivaroxaban (XARELTO) 20 MG TABS tablet, TAKE 1 TABLET(20 MG) BY MOUTH WITH SUPPER, Disp: , Rfl:    Vitamin D, Ergocalciferol, (DRISDOL) 1.25 MG (50000 UNIT) CAPS capsule, Take 50,000 Units by mouth every 7 (seven) days., Disp: , Rfl:   Current Facility-Administered Medications:    cyanocobalamin ((VITAMIN B-12)) injection 1,000 mcg, 1,000 mcg, Intramuscular, Once, Ghumman, Ramandeep, NP   dupilumab (DUPIXENT) prefilled syringe 300 mg, 300 mg,  Subcutaneous, Q14 Days, Alfonse Spruce, MD, 300 mg at 09/07/23 1606  Observations/Objective: Today's Vitals   Patient not able to obtain VS at home today. Denies any fever. Physical Exam Neurological:     Mental Status: She is alert.     Assessment and Plan: Nausea and vomiting, unspecified vomiting type -     Ondansetron; Take 1 tablet (4 mg total) by mouth every 6 (six) hours as needed for nausea or vomiting.  Dispense: 30 tablet; Refill: 0    Follow Up Instructions: Return if symptoms worsen or fail to improve, for Keep scheduled appt for 09/28/2023.   I discussed the assessment and treatment plan with the patient. The patient was provided an opportunity to ask questions, and all were answered. The patient agreed with the plan and demonstrated an understanding of the instructions.   The patient was advised to call back or seek an in-person evaluation if the symptoms worsen or if the condition fails to improve as anticipated.  The above assessment and management plan was discussed with the patient. The patient verbalized understanding of and has agreed to the management plan.   I, Ellender Hose, NP, have reviewed all documentation for this visit. The documentation on 09/20/2023 for the exam, diagnosis, procedures, and orders are all accurate and complete.

## 2023-09-20 ENCOUNTER — Encounter: Payer: Self-pay | Admitting: Family Medicine

## 2023-09-20 DIAGNOSIS — R112 Nausea with vomiting, unspecified: Secondary | ICD-10-CM | POA: Insufficient documentation

## 2023-09-20 HISTORY — DX: Nausea with vomiting, unspecified: R11.2

## 2023-09-23 ENCOUNTER — Ambulatory Visit: Payer: BC Managed Care – PPO

## 2023-09-23 DIAGNOSIS — J4550 Severe persistent asthma, uncomplicated: Secondary | ICD-10-CM | POA: Diagnosis not present

## 2023-09-23 DIAGNOSIS — J309 Allergic rhinitis, unspecified: Secondary | ICD-10-CM

## 2023-09-24 ENCOUNTER — Other Ambulatory Visit: Payer: Self-pay

## 2023-09-24 NOTE — Progress Notes (Signed)
Specialty Pharmacy Refill Coordination Note  Tracey Escobar is a 52 y.o. female contacted today regarding refills of specialty medication(s) Dupilumab (Dupixent)   Patient requested Courier to Provider Office   Delivery date: 10/11/23   Verified address: [facility] [address] [place] [address]   Medication will be filled on 10/08/23.

## 2023-09-27 NOTE — Progress Notes (Unsigned)
Madelaine Bhat, CMA,acting as a scribe for [name], MD.,have documented all relevant documentation on the behalf of [name], MD,as directed by  [name], MD while in the presence of [name], MD.  Subjective:  Patient ID: Tracey Escobar , female    DOB: 16-May-1971 , 52 y.o.   MRN: [id number]  No chief complaint on file.   HPI  Patient presents today for a bp follow up, Patient reports compliance with medication. Patient denies any chest pain, SOB, or headaches. Patient has no concerns today.     Past Medical History:  Diagnosis Date  . Arthritis   . Asthma   . Atrial fibrillation (HCC)   . Atrial fibrillation with RVR (HCC) 12/15/2016  . Dysrhythmia    A-Fib  . GERD (gastroesophageal reflux disease)   . Hypertension   . Irritable bowel   . Nausea and vomiting 09/20/2023  . Pneumonia   . Sleep apnea   . Sleep apnea   . Vitamin D deficiency      Family History  Problem Relation Age of Onset  . Hyperlipidemia Mother   . Hypertension Mother   . Hyperlipidemia Father   . Hypertension Father   . COPD Father   . Heart failure Father   . Diabetes Brother   . Hypertension Maternal Grandmother   . Seizures Maternal Grandmother   . Cancer Paternal Grandfather   . Cancer Brother   . Allergic rhinitis Neg Hx   . Angioedema Neg Hx   . Asthma Neg Hx   . Eczema Neg Hx   . Immunodeficiency Neg Hx   . Urticaria Neg Hx      Current Outpatient Medications:  .  CALCIUM PO, Take 1 tablet by mouth daily at 2 am., Disp: , Rfl:  .  cetirizine (ZYRTEC) 10 MG tablet, Take 1 tablet (10 mg total) by mouth every morning., Disp: 30 tablet, Rfl: 2 .  cromolyn (OPTICROM) 4 % ophthalmic solution, Place 1 drop into both eyes 4 (four) times daily., Disp: 10 mL, Rfl: 12 .  dupilumab (DUPIXENT) 300 MG/2ML prefilled syringe, Inject 300 mg into the skin every 14 (fourteen) days., Disp: 4 mL, Rfl: 11 .  EPINEPHrine 0.3 mg/0.3 mL IJ SOAJ injection, Inject 0.3 mg into  the muscle as needed for anaphylaxis., Disp: 2 each, Rfl: 1 .  Estradiol 10 MCG TABS vaginal tablet, estradiol 10 mcg vaginal tablet, Disp: , Rfl:  .  flecainide (TAMBOCOR) 150 MG tablet, Take 150 mg by mouth 2 (two) times daily., Disp: , Rfl:  .  Fluticasone-Umeclidin-Vilant (TRELEGY ELLIPTA) 200-62.5-25 MCG/ACT AEPB, Inhale 1 puff into the lungs daily. Rinse mouth after each use., Disp: 60 each, Rfl: 5 .  hydrochlorothiazide (HYDRODIURIL) 12.5 MG tablet, TAKE 1 TABLET(12.5 MG) BY MOUTH DAILY, Disp: 90 tablet, Rfl: 1 .  levocetirizine (XYZAL) 5 MG tablet, Take 1 tablet (5 mg total) by mouth every evening., Disp: 30 tablet, Rfl: 2 .  losartan (COZAAR) 25 MG tablet, TAKE 1 TABLET(25 MG) BY MOUTH DAILY, Disp: 90 tablet, Rfl: 1 .  magnesium gluconate (MAGONATE) 500 MG tablet, Take 500 mg by mouth daily., Disp: , Rfl:  .  metoprolol succinate (TOPROL-XL) 100 MG 24 hr tablet, Take 100 mg by mouth daily. , Disp: , Rfl:  .  montelukast (SINGULAIR) 10 MG tablet, TAKE 1 TABLET(10 MG) BY MOUTH AT BEDTIME, Disp: 30 tablet, Rfl: 5 .  Olopatadine-Mometasone (RYALTRIS) 665-25 MCG/ACT SUSP, Place 1-2 sprays into the nose in  the morning and at bedtime., Disp: 29 g, Rfl: 5 .  ondansetron (ZOFRAN-ODT) 4 MG disintegrating tablet, Take 1 tablet (4 mg total) by mouth every 6 (six) hours as needed for nausea or vomiting., Disp: 30 tablet, Rfl: 0 .  pantoprazole (PROTONIX) 40 MG tablet, TAKE 1 TABLET(40 MG) BY MOUTH DAILY, Disp: 90 tablet, Rfl: 1 .  RESTASIS 0.05 % ophthalmic emulsion, INSTILL 1 DROP IN BOTH EYES TWICE DAILY, Disp: 60 each, Rfl: 1 .  rivaroxaban (XARELTO) 20 MG TABS tablet, TAKE 1 TABLET(20 MG) BY MOUTH WITH SUPPER, Disp: , Rfl:  .  Vitamin D, Ergocalciferol, (DRISDOL) 1.25 MG (50000 UNIT) CAPS capsule, Take 50,000 Units by mouth every 7 (seven) days., Disp: , Rfl:   Current Facility-Administered Medications:  .  cyanocobalamin ((VITAMIN B-12)) injection 1,000 mcg, 1,000 mcg, Intramuscular, Once,  Ghumman, Ramandeep, NP .  dupilumab (DUPIXENT) prefilled syringe 300 mg, 300 mg, Subcutaneous, Q14 Days, Alfonse Spruce, MD, 300 mg at 09/23/23 1638   No Known Allergies   Review of Systems   There were no vitals filed for this visit. There is no height or weight on file to calculate BMI.  Wt Readings from Last 3 Encounters:  07/29/23 (!) 302 lb 8 oz (137.2 kg)  03/23/23 (!) 304 lb 6.4 oz (138.1 kg)  03/03/23 (!) 306 lb 12.8 oz (139.2 kg)    The 10-year ASCVD risk score (Arnett DK, et al., 2019) is: 3.5%   Values used to calculate the score:     Age: 75 years     Sex: Female     Is Non-Hispanic African American: Yes     Diabetic: No     Tobacco smoker: No     Systolic Blood Pressure: 129 mmHg     Is BP treated: Yes     HDL Cholesterol: 52 mg/dL     Total Cholesterol: 145 mg/dL  Objective:  Physical Exam      Assessment And Plan:  Hypertensive heart disease without heart failure    No follow-ups on file.  Patient was given opportunity to ask questions. Patient verbalized understanding of the plan and was able to repeat key elements of the plan. All questions were answered to their satisfaction.    I, [name], MD, have reviewed all documentation for this visit. The documentation on 09/27/23 for the exam, diagnosis, procedures, and orders are all accurate and complete.   IF YOU HAVE BEEN REFERRED TO A SPECIALIST, IT MAY TAKE 1-2 WEEKS TO SCHEDULE/PROCESS THE REFERRAL. IF YOU HAVE NOT HEARD FROM US/SPECIALIST IN TWO WEEKS, PLEASE GIVE [place] A CALL AT [phone].

## 2023-09-28 ENCOUNTER — Encounter: Payer: Self-pay | Admitting: Internal Medicine

## 2023-09-28 ENCOUNTER — Ambulatory Visit: Payer: BC Managed Care – PPO | Admitting: Internal Medicine

## 2023-09-28 VITALS — BP 126/80 | HR 95 | Temp 97.9°F | Ht 67.0 in | Wt 295.8 lb

## 2023-09-28 DIAGNOSIS — Z6841 Body Mass Index (BMI) 40.0 and over, adult: Secondary | ICD-10-CM

## 2023-09-28 DIAGNOSIS — D6869 Other thrombophilia: Secondary | ICD-10-CM

## 2023-09-28 DIAGNOSIS — I480 Paroxysmal atrial fibrillation: Secondary | ICD-10-CM | POA: Diagnosis not present

## 2023-09-28 DIAGNOSIS — I119 Hypertensive heart disease without heart failure: Secondary | ICD-10-CM

## 2023-09-28 DIAGNOSIS — K5909 Other constipation: Secondary | ICD-10-CM | POA: Diagnosis not present

## 2023-09-28 DIAGNOSIS — R0789 Other chest pain: Secondary | ICD-10-CM | POA: Diagnosis not present

## 2023-09-28 DIAGNOSIS — E66813 Obesity, class 3: Secondary | ICD-10-CM

## 2023-09-28 DIAGNOSIS — Z8249 Family history of ischemic heart disease and other diseases of the circulatory system: Secondary | ICD-10-CM

## 2023-09-28 DIAGNOSIS — G8929 Other chronic pain: Secondary | ICD-10-CM

## 2023-09-28 DIAGNOSIS — M898X1 Other specified disorders of bone, shoulder: Secondary | ICD-10-CM

## 2023-09-28 MED ORDER — BACLOFEN 10 MG PO TABS: 10.0000 mg | ORAL_TABLET | Freq: Every day | ORAL | 1 refills | Status: AC

## 2023-09-30 DIAGNOSIS — G8929 Other chronic pain: Secondary | ICD-10-CM | POA: Insufficient documentation

## 2023-09-30 DIAGNOSIS — D6869 Other thrombophilia: Secondary | ICD-10-CM | POA: Insufficient documentation

## 2023-09-30 DIAGNOSIS — R0789 Other chest pain: Secondary | ICD-10-CM | POA: Insufficient documentation

## 2023-09-30 LAB — CMP14+EGFR
ALT: 29 [IU]/L (ref 0–32)
AST: 21 [IU]/L (ref 0–40)
Albumin: 4.1 g/dL (ref 3.8–4.9)
Alkaline Phosphatase: 117 [IU]/L (ref 44–121)
BUN/Creatinine Ratio: 10 (ref 9–23)
BUN: 8 mg/dL (ref 6–24)
Bilirubin Total: 0.4 mg/dL (ref 0.0–1.2)
CO2: 22 mmol/L (ref 20–29)
Calcium: 10.1 mg/dL (ref 8.7–10.2)
Chloride: 106 mmol/L (ref 96–106)
Creatinine, Ser: 0.77 mg/dL (ref 0.57–1.00)
Globulin, Total: 3.3 g/dL (ref 1.5–4.5)
Glucose: 86 mg/dL (ref 70–99)
Potassium: 4.3 mmol/L (ref 3.5–5.2)
Sodium: 142 mmol/L (ref 134–144)
Total Protein: 7.4 g/dL (ref 6.0–8.5)
eGFR: 92 mL/min/{1.73_m2} (ref >59)

## 2023-09-30 LAB — HEMOGLOBIN A1C
Est. average glucose Bld gHb Est-mCnc: 114 mg/dL
Hgb A1c MFr Bld: 5.6 % (ref 4.8–5.6)

## 2023-09-30 LAB — LIPOPROTEIN A (LPA): Lipoprotein (a): 22.2 nmol/L (ref <75.0)

## 2023-09-30 NOTE — Assessment & Plan Note (Signed)
Chronic, currently on Xarelto 20mg  daily due to underlying PAF.

## 2023-09-30 NOTE — Assessment & Plan Note (Signed)
I think her sx are musculoskeletal in nature. However, due to cardiac RF's, I planned to do EKG. Unfortunately, this could not be performed due to software issue.   IT was contacted and advised of urgency of issue, unfortunately, they did not respond in a reasonable timeframe. She is advised to consider contacting her cardiologist to discuss her sx, especially if they worsen.

## 2023-09-30 NOTE — Assessment & Plan Note (Signed)
Left-sided, tenderness to palpation. I think her sx are musculoskeletal in nature. I will send rx baclofen to take nightly as needed. She wll let me know if her sx persist. We also discussed proper posture while on computer. She may also benefit from chiropractic evaluation.

## 2023-09-30 NOTE — Assessment & Plan Note (Signed)
Chronic, she has done well with Linzess in the past. She was given samples of 145mg  x 2 weeks, and advised to then increase to 290mg  usual dosage. She is aware that PA will need to be done given her new insurance.

## 2023-09-30 NOTE — Assessment & Plan Note (Signed)
Chronic, she is also followed by Cardiology.  She will continue with metoprolol XL 100mg  daily and Xarelto.

## 2023-09-30 NOTE — Assessment & Plan Note (Signed)
BMI 46. She is encouraged to initially strive for BMI less than 40 to decrease cardiac risk. Advised to aim for at least 150 minutes of exercise per week.

## 2023-09-30 NOTE — Assessment & Plan Note (Addendum)
Chronic, fair control. Goal BP<120/80. Advised to follow low sodium diet. She will continue with losartan 25mg  daily and hydrochlorothiazide 12.5mg  daily.  She is encouraged to gradually increase her daily activity level.

## 2023-10-01 ENCOUNTER — Encounter: Payer: Self-pay | Admitting: Internal Medicine

## 2023-10-07 ENCOUNTER — Ambulatory Visit: Payer: BC Managed Care – PPO | Admitting: Allergy & Immunology

## 2023-10-15 ENCOUNTER — Ambulatory Visit: Payer: BC Managed Care – PPO | Admitting: Internal Medicine

## 2023-10-15 ENCOUNTER — Ambulatory Visit: Payer: BLUE CROSS/BLUE SHIELD | Admitting: *Deleted

## 2023-10-15 DIAGNOSIS — J4550 Severe persistent asthma, uncomplicated: Secondary | ICD-10-CM

## 2023-10-15 DIAGNOSIS — J309 Allergic rhinitis, unspecified: Secondary | ICD-10-CM

## 2023-10-19 ENCOUNTER — Other Ambulatory Visit: Payer: Self-pay | Admitting: Allergy & Immunology

## 2023-10-19 ENCOUNTER — Ambulatory Visit (INDEPENDENT_AMBULATORY_CARE_PROVIDER_SITE_OTHER): Payer: Self-pay

## 2023-10-19 DIAGNOSIS — J309 Allergic rhinitis, unspecified: Secondary | ICD-10-CM

## 2023-10-28 ENCOUNTER — Ambulatory Visit: Payer: BLUE CROSS/BLUE SHIELD

## 2023-10-28 ENCOUNTER — Encounter: Payer: Self-pay | Admitting: Internal Medicine

## 2023-10-28 ENCOUNTER — Ambulatory Visit: Payer: BLUE CROSS/BLUE SHIELD | Admitting: Internal Medicine

## 2023-10-28 VITALS — BP 118/68 | HR 78 | Resp 16

## 2023-10-28 DIAGNOSIS — J302 Other seasonal allergic rhinitis: Secondary | ICD-10-CM | POA: Diagnosis not present

## 2023-10-28 DIAGNOSIS — J3089 Other allergic rhinitis: Secondary | ICD-10-CM

## 2023-10-28 DIAGNOSIS — K2100 Gastro-esophageal reflux disease with esophagitis, without bleeding: Secondary | ICD-10-CM | POA: Diagnosis not present

## 2023-10-28 DIAGNOSIS — J4550 Severe persistent asthma, uncomplicated: Secondary | ICD-10-CM | POA: Diagnosis not present

## 2023-10-28 MED ORDER — FAMOTIDINE 40 MG PO TABS: 40.0000 mg | ORAL_TABLET | Freq: Every day | ORAL | 5 refills | Status: AC

## 2023-10-28 MED ORDER — AZELASTINE-FLUTICASONE 137-50 MCG/ACT NA SUSP: 1.0000 | Freq: Two times a day (BID) | NASAL | 5 refills | Status: DC | PRN

## 2023-10-28 NOTE — Progress Notes (Signed)
FOLLOW UP Date of Service/Encounter:  10/28/23  Subjective:  Tracey Escobar (DOB: 07-21-1971) is a 53 y.o. female who [facility] in re-evaluation of the following: asthma, allergic rhinoconjunctivitis, reflux History obtained from: chart review and patient.  For Review, LV was on 07/29/23  with Dr. Selena Batten seen for routine follow-up. This is my second encounter with this patient. Our first encounter was for [facility] procedure. See below for summary of history and diagnostics.   Therapeutic plans/changes recommended at last visit: 72% FEV1, doing well, continued on dupixent q2 weeks. ----------------------------------------------------- Pertinent History/Diagnostics:  Severe persistent asthma without complication Started Dupixent in June 2024 prefers it over [place] but missed a few doses as she lost her insurance during her job change. Feels asthma improved on this medication. Allergic Rhinitis and Conjunctivitis:  Has dry/itchy eyes. She is on allergy injections.  Current Plan: levocetirizine, singulair, zyrtec, and ryaltris added 07/29/23 Started RUSH 01/15/23. She has 1 vial: contains trees, grasses, and dust mites, has not reached maintenance due to gaps in therapy Other: Reflux on pantoprazole 40 mg daily. --------------------------------------------------- Today presents for follow-up. Discussed the use of AI scribe software for clinical note transcription with the patient, who gave verbal consent to proceed.  History of Present Illness   The patient, with a history of asthma, arthritis, and allergies, reports overall good health with no recent need for a rescue inhaler or steroids for breathing issues. She has been managing her asthma with Trelegy 200 and has not required any additional interventions since her last visit in October including no ED visits, no systemic steroids for breathing or antibiotics.   The patient has been receiving  allergy injections and Dupixent, which she believes have significantly improved her previous allergy and asthma issues. She has been using Ryaltris nasal spray, but expresses a preference for the combination of azelastine and fluticasone (Dymista), which she feels was more effective. She also takes levocetirizine (Xyzal) for allergies.  The patient has been experiencing nasal congestion, particularly in the mornings, sometimes to the point of bloody discharge. She has not previously used saline spray for this issue.  The patient has been on long-term pantoprazole for reflux, but reports no current reflux symptoms. She also takes Singulair, but not Zyrtec, which was previously recommended for morning congestion as an add-on therapy. The patient carries an EpiPen, which is up-to-date.  She works for [employer] in [place].     Chart Review: Her most recent allergy injection was 0.3 mL of red vial on 10/19/23.  All medications reviewed by clinical staff and updated in chart. No new pertinent medical or surgical history except as noted in HPI.  ROS: All others negative except as noted per HPI.   Objective:  BP 118/68 (Patient Position: Sitting)   Pulse 78   Resp 16   SpO2 98%  There is no height or weight on file to calculate BMI. Physical Exam: General Appearance:  Alert, cooperative, no distress, appears stated age  Head:  Normocephalic, without obvious abnormality, atraumatic  Eyes:  Conjunctiva clear, EOM's intact  Ears EACs normal bilaterally and normal TMs bilaterally  Nose: Nares normal, hypertrophic turbinates, normal mucosa, no visible anterior polyps, and septum midline  Throat: Lips, tongue normal; teeth and gums normal, normal posterior oropharynx  Neck: Supple, symmetrical  Lungs:   clear to auscultation bilaterally, Respirations unlabored, no coughing  Heart:  regular rate and rhythm and no murmur, Appears well perfused  Extremities: No  edema  Skin: Skin color,  texture, turgor normal and no rashes or lesions on visualized portions of skin  Neurologic: No gross deficits   Labs:  Lab Orders  No laboratory test(s) ordered today    Spirometry:  Tracings reviewed. Her effort: Good reproducible efforts. FVC: 1.71L FEV1: 1.40L, 55% predicted FEV1/FVC ratio: 0.82 Interpretation: Nonobstructive ratio, low FEV1, possible restriction.  Please see scanned spirometry results for details.  Assessment/Plan   Asthma-at goal on dupixent Daily controller medication(s): Trelegy 1 puff once a day and rinse mouth after each use. Sample given.  Continue Dupixent injections 300mg  every 2 weeks.  May use albuterol rescue inhaler 2 puffs every 4 to 6 hours as needed for shortness of breath, chest tightness, coughing, and wheezing. May use albuterol rescue inhaler 2 puffs 5 to 15 minutes prior to strenuous physical activities. Monitor frequency of use - if you need to use it more than twice per week on a consistent basis let us know.  Breathing control goals:  Full participation in all desired activities (may need albuterol before activity) Albuterol use two times or less a week on average (not counting use with activity) Cough interfering with sleep two times or less a month Oral steroids no more than once a year No hospitalizations   Allergic rhinitis-at goal, improved on AIT, not yet at maintenance 2022 testing positive to grass, trees. Continue environmental control measures. Continue levocetirizine 5mg  daily at night. Continue Singulair (montelukast) 10mg  daily at night. Start Dymista (azelastine + fluticasone) 1-2 sprays per nostril twice a day. This replaces your other nasal sprays. If this works well for you, then have [facility] ship the medication to your home - prescription already sent in.  Nasal saline spray (i.e., Simply Saline) or nasal saline lavage (i.e., NeilMed) is recommended as needed and prior to medicated nasal sprays. Use cromolyn  4% 1 drop in each eye up to four times a day as needed for itchy/watery eyes.  Allergy eye drops do tend to worsen dry eyes.  Use eye drops for dry eyes as needed.  Continue allergy injections at the [facility] office. Continue to have access to your epinephrine autoinjector   Reflux-at goal Continue lifestyle and dietary modifications. Stop pantoprazole 40mg  once day  Start famotidine 40 mg daily, if not controlled, go back to pantoprazole  Return in about 6 months (around 04/26/2024). Or sooner if needed.   It was a pleasure meeting you in clinic today! Thank you for allowing me to participate in your care.  Other: allergy injection given in clinic today and biologic given in clinic today (dupixent)  Tonny Bollman, MD  [facility]

## 2023-10-28 NOTE — Patient Instructions (Addendum)
Asthma Daily controller medication(s): Trelegy 1 puff once a day and rinse mouth after each use. Sample given.  Continue Dupixent injections 300mg  every 2 weeks.  May use albuterol rescue inhaler 2 puffs every 4 to 6 hours as needed for shortness of breath, chest tightness, coughing, and wheezing. May use albuterol rescue inhaler 2 puffs 5 to 15 minutes prior to strenuous physical activities. Monitor frequency of use - if you need to use it more than twice per week on a consistent basis let us know.  Breathing control goals:  Full participation in all desired activities (may need albuterol before activity) Albuterol use two times or less a week on average (not counting use with activity) Cough interfering with sleep two times or less a month Oral steroids no more than once a year No hospitalizations   Allergic rhinitis 2022 testing positive to grass, trees. Continue environmental control measures. Continue levocetirizine 5mg  daily at night. Continue Singulair (montelukast) 10mg  daily at night. Start Dymista (azelastine + fluticasone) 1-2 sprays per nostril twice a day. This replaces your other nasal sprays. If this works well for you, then have [facility] ship the medication to your home - prescription already sent in.  Nasal saline spray (i.e., Simply Saline) or nasal saline lavage (i.e., NeilMed) is recommended as needed and prior to medicated nasal sprays. Use cromolyn 4% 1 drop in each eye up to four times a day as needed for itchy/watery eyes.  Allergy eye drops do tend to worsen dry eyes.  Use eye drops for dry eyes as needed.  Continue allergy injections at the [facility] office. Continue to have access to your epinephrine autoinjector   Reflux Continue lifestyle and dietary modifications. Stop pantoprazole 40mg  once day  Start famotidine 40 mg daily, if not controlled, go back to pantoprazole  Return in about 6 months (around 04/26/2024). Or sooner if needed.   It was a  pleasure meeting you in clinic today! Thank you for allowing me to participate in your care.  Tonny Bollman, MD [facility]   Reducing Pollen Exposure Pollen seasons: trees (spring), grass (summer) and ragweed/weeds (fall). Keep windows closed in your home and car to lower pollen exposure.  Install air conditioning in the bedroom and throughout the house if possible.  Avoid going out in dry windy days - especially early morning. Pollen counts are highest between 5 - 10 AM and on dry, hot and windy days.  Save outside activities for late afternoon or after a heavy rain, when pollen levels are lower.  Avoid mowing of grass if you have grass pollen allergy. Be aware that pollen can also be transported indoors on people and pets.  Dry your clothes in an automatic dryer rather than hanging them outside where they might collect pollen.  Rinse hair and eyes before bedtime.

## 2023-11-01 ENCOUNTER — Encounter: Payer: Self-pay | Admitting: Internal Medicine

## 2023-11-01 ENCOUNTER — Other Ambulatory Visit: Payer: Self-pay | Admitting: Internal Medicine

## 2023-11-01 MED ORDER — AZELASTINE HCL 0.1 % NA SOLN: 2.0000 | Freq: Two times a day (BID) | NASAL | 6 refills | Status: AC | PRN

## 2023-11-01 MED ORDER — FLUTICASONE PROPIONATE 50 MCG/ACT NA SUSP: 2.0000 | Freq: Every day | NASAL | 6 refills | Status: AC

## 2023-11-01 NOTE — Telephone Encounter (Signed)
Routed to admin pool so they can complete transfer of care

## 2023-11-01 NOTE — Telephone Encounter (Signed)
Thank you :)

## 2023-11-01 NOTE — Addendum Note (Signed)
Addended by: Modesto Charon on: 11/01/2023 03:57 PM   Modules accepted: Orders

## 2023-11-02 ENCOUNTER — Other Ambulatory Visit: Payer: Self-pay

## 2023-11-04 ENCOUNTER — Encounter: Payer: Self-pay | Admitting: Internal Medicine

## 2023-11-04 NOTE — Telephone Encounter (Signed)
Tracey Escobar can you call and schedule the patient for a transfer of care appointment. Patient is on allergy shots and dupixent. Will need something soon so she will not have to go without her injections to long.   Thanks

## 2023-11-05 ENCOUNTER — Other Ambulatory Visit: Payer: Self-pay

## 2023-11-08 ENCOUNTER — Other Ambulatory Visit: Payer: Self-pay

## 2023-11-10 NOTE — Telephone Encounter (Signed)
 I did finally hear back from the docs at [facility]. They are going to be opening up some new patient appointments soon and said they would have their schedulers reach out to call her.  I don't have any other information at this time.

## 2023-11-17 ENCOUNTER — Ambulatory Visit: Payer: BLUE CROSS/BLUE SHIELD

## 2023-11-30 ENCOUNTER — Other Ambulatory Visit: Payer: Self-pay | Admitting: Obstetrics and Gynecology

## 2023-11-30 DIAGNOSIS — Z1231 Encounter for screening mammogram for malignant neoplasm of breast: Secondary | ICD-10-CM

## 2023-12-06 ENCOUNTER — Other Ambulatory Visit (HOSPITAL_COMMUNITY): Payer: Self-pay

## 2023-12-06 ENCOUNTER — Other Ambulatory Visit: Payer: Self-pay

## 2023-12-06 MED ORDER — DUPIXENT 300 MG/2ML ~~LOC~~ SOSY
PREFILLED_SYRINGE | SUBCUTANEOUS | 11 refills | Status: AC
  Filled 2023-12-09: qty 4, 28d supply, fill #0
  Filled 2023-12-31: qty 4, 28d supply, fill #1
  Filled 2024-01-27: qty 4, 28d supply, fill #2
  Filled 2024-02-23: qty 4, 28d supply, fill #3
  Filled 2024-03-30: qty 4, 28d supply, fill #4
  Filled 2024-04-25 – 2024-04-28 (×2): qty 4, 28d supply, fill #5
  Filled 2024-05-18 (×2): qty 4, 28d supply, fill #6
  Filled 2024-09-05: qty 4, 28d supply, fill #7
  Filled 2024-09-27: qty 4, 28d supply, fill #8
  Filled 2024-10-23 – 2024-11-09 (×5): qty 4, 28d supply, fill #9

## 2023-12-07 ENCOUNTER — Other Ambulatory Visit: Payer: Self-pay

## 2023-12-09 ENCOUNTER — Other Ambulatory Visit: Payer: Self-pay

## 2023-12-09 NOTE — Progress Notes (Signed)
 Specialty Pharmacy Refill Coordination Note  Tracey Escobar is a 53 y.o. female contacted today regarding refills of specialty medication(s) Dupilumab (Dupixent)   Patient requested Delivery   Delivery date: 12/14/23   Verified address: MD's office- [address], [place], [place]   Medication will be filled on 12/13/23.    Medication will be shipped to MD's office, called office to confirm address and appt date as script was written by a MD in the same practice, but out of a different office.

## 2023-12-10 ENCOUNTER — Telehealth: Payer: Self-pay

## 2023-12-10 ENCOUNTER — Other Ambulatory Visit: Payer: Self-pay

## 2023-12-13 ENCOUNTER — Encounter: Payer: Self-pay | Admitting: Internal Medicine

## 2023-12-16 ENCOUNTER — Other Ambulatory Visit: Payer: Self-pay

## 2023-12-16 DIAGNOSIS — E66813 Obesity, class 3: Secondary | ICD-10-CM

## 2023-12-16 MED ORDER — WEGOVY 0.5 MG/0.5ML ~~LOC~~ SOAJ: 0.5000 mg | SUBCUTANEOUS | 1 refills | Status: DC

## 2023-12-24 ENCOUNTER — Other Ambulatory Visit: Payer: Self-pay

## 2023-12-28 ENCOUNTER — Other Ambulatory Visit: Payer: Self-pay

## 2023-12-30 ENCOUNTER — Ambulatory Visit: Payer: Self-pay | Admitting: Internal Medicine

## 2023-12-31 ENCOUNTER — Other Ambulatory Visit: Payer: Self-pay | Admitting: Pharmacy Technician

## 2023-12-31 ENCOUNTER — Other Ambulatory Visit: Payer: Self-pay

## 2023-12-31 NOTE — Progress Notes (Signed)
 Specialty Pharmacy Refill Coordination Note  Tracey Escobar is a 53 y.o. female contacted today regarding refills of specialty medication(s) Dupilumab (Dupixent)   Patient requested Delivery   Delivery date: 01/05/24   Verified address: [address] ([place] adress)   Medication will be filled on 01/04/24.

## 2024-01-04 ENCOUNTER — Other Ambulatory Visit: Payer: Self-pay

## 2024-01-04 MED ORDER — PANTOPRAZOLE SODIUM 40 MG PO TBEC: DELAYED_RELEASE_TABLET | ORAL | 1 refills | Status: DC

## 2024-01-13 ENCOUNTER — Ambulatory Visit: Payer: Self-pay

## 2024-01-18 ENCOUNTER — Ambulatory Visit
Admission: RE | Admit: 2024-01-18 | Discharge: 2024-01-18 | Disposition: A | Discharge status: Home / Self Care | Payer: Self-pay | Source: Ambulatory Visit | Attending: Obstetrics and Gynecology | Admitting: Obstetrics and Gynecology

## 2024-01-18 DIAGNOSIS — Z1231 Encounter for screening mammogram for malignant neoplasm of breast: Secondary | ICD-10-CM

## 2024-01-21 ENCOUNTER — Ambulatory Visit: Payer: Self-pay | Admitting: Family Medicine

## 2024-01-25 ENCOUNTER — Other Ambulatory Visit: Payer: Self-pay

## 2024-01-26 ENCOUNTER — Other Ambulatory Visit: Payer: Self-pay

## 2024-01-27 ENCOUNTER — Other Ambulatory Visit: Payer: Self-pay

## 2024-01-27 NOTE — Progress Notes (Signed)
 Specialty Pharmacy Refill Coordination Note  Tracey Escobar is a 53 y.o. female contacted today regarding refills of specialty medication(s) Dupilumab  (Dupixent )   Patient requested (Patient-Rptd) Delivery   Delivery date: (Patient-Rptd) 02/03/24   Verified address: (Patient-Rptd) [address] [place], [place] [address]   Medication will be filled on 02/02/24.

## 2024-02-02 ENCOUNTER — Other Ambulatory Visit: Payer: Self-pay

## 2024-02-13 ENCOUNTER — Other Ambulatory Visit: Payer: Self-pay | Admitting: Allergy & Immunology

## 2024-02-22 ENCOUNTER — Other Ambulatory Visit: Payer: Self-pay

## 2024-02-22 MED ORDER — DUPIXENT 300 MG/2ML ~~LOC~~ SOAJ
SUBCUTANEOUS | 6 refills | Status: AC
  Filled 2024-06-21: qty 4, 28d supply, fill #0
  Filled 2024-07-17 – 2024-07-28 (×2): qty 4, 28d supply, fill #1
  Filled 2024-08-28: qty 4, 28d supply, fill #2

## 2024-02-23 ENCOUNTER — Other Ambulatory Visit: Payer: Self-pay

## 2024-02-23 NOTE — Progress Notes (Signed)
 Specialty Pharmacy Refill Coordination Note  Tracey Escobar is a 53 y.o. female contacted today regarding refills of specialty medication(s) Dupilumab  (Dupixent )   Patient requested Delivery   Delivery date: 03/02/24   Verified address: [address] [place], [place] [address]   Medication will be filled on 03/01/24.

## 2024-02-23 NOTE — Progress Notes (Signed)
 Specialty Pharmacy Ongoing Clinical Assessment Note  Tracey Escobar is a 53 y.o. female who is being followed by the specialty pharmacy service for RxSp Asthma/COPD   Patient's specialty medication(s) reviewed today: Dupilumab  (Dupixent )   Missed doses in the last 4 weeks: 0   Patient/Caregiver did not have any additional questions or concerns.   Therapeutic benefit summary: Patient is achieving benefit   Adverse events/side effects summary: No adverse events/side effects   Patient's therapy is appropriate to: Continue    Goals Addressed             This Visit's Progress    Reduce disease symptoms including coughing and shortness of breath   On track    Patient is on track. Patient will maintain adherence         Follow up: 6 months  Tabbetha Kutscher M [place] Specialty Pharmacist

## 2024-03-01 ENCOUNTER — Other Ambulatory Visit: Payer: Self-pay

## 2024-03-09 ENCOUNTER — Other Ambulatory Visit: Payer: Self-pay | Admitting: Allergy & Immunology

## 2024-03-14 ENCOUNTER — Encounter: Payer: Self-pay | Admitting: Internal Medicine

## 2024-03-14 ENCOUNTER — Ambulatory Visit: Payer: BC Managed Care – PPO | Admitting: Internal Medicine

## 2024-03-14 ENCOUNTER — Telehealth: Payer: Self-pay | Admitting: Internal Medicine

## 2024-03-14 ENCOUNTER — Ambulatory Visit: Payer: Self-pay | Admitting: Internal Medicine

## 2024-03-14 VITALS — BP 120/82 | HR 87 | Temp 98.7°F | Ht 67.0 in | Wt 304.4 lb

## 2024-03-14 DIAGNOSIS — I119 Hypertensive heart disease without heart failure: Secondary | ICD-10-CM

## 2024-03-14 DIAGNOSIS — H6123 Impacted cerumen, bilateral: Secondary | ICD-10-CM

## 2024-03-14 DIAGNOSIS — D6869 Other thrombophilia: Secondary | ICD-10-CM

## 2024-03-14 DIAGNOSIS — M79661 Pain in right lower leg: Secondary | ICD-10-CM

## 2024-03-14 DIAGNOSIS — E66813 Obesity, class 3: Secondary | ICD-10-CM

## 2024-03-14 DIAGNOSIS — Z6841 Body Mass Index (BMI) 40.0 and over, adult: Secondary | ICD-10-CM

## 2024-03-14 DIAGNOSIS — Z0000 Encounter for general adult medical examination without abnormal findings: Secondary | ICD-10-CM

## 2024-03-14 DIAGNOSIS — J320 Chronic maxillary sinusitis: Secondary | ICD-10-CM

## 2024-03-14 DIAGNOSIS — R82998 Other abnormal findings in urine: Secondary | ICD-10-CM

## 2024-03-14 DIAGNOSIS — I480 Paroxysmal atrial fibrillation: Secondary | ICD-10-CM

## 2024-03-14 LAB — POCT URINALYSIS DIP (CLINITEK)
Bilirubin, UA: NEGATIVE
Blood, UA: NEGATIVE
Glucose, UA: NEGATIVE mg/dL
Ketones, POC UA: NEGATIVE mg/dL
Nitrite, UA: NEGATIVE
Spec Grav, UA: 1.025 (ref 1.010–1.025)
Urobilinogen, UA: 0.2 U/dL
pH, UA: 6 (ref 5.0–8.0)

## 2024-03-14 MED ORDER — AMOXICILLIN-POT CLAVULANATE 875-125 MG PO TABS: 1.0000 | ORAL_TABLET | Freq: Two times a day (BID) | ORAL | 0 refills | Status: AC

## 2024-03-14 NOTE — Progress Notes (Signed)
 I,[name], CMA,acting as a Neurosurgeon for Smiley Dung, MD.,have documented all relevant documentation on the behalf of Smiley Dung, MD,as directed by  Smiley Dung, MD while in the presence of Smiley Dung, MD.  Subjective:    Patient ID: Tracey Escobar , female    DOB: 1970/11/11 , 53 y.o.   MRN: [id number]  Chief Complaint  Patient presents with   Annual Exam    Patient presents today for annual exam, Patient doesn't have any specific questions or concerns. Patient denies headaches, chest pain & sob.    HPI Discussed the use of AI scribe software for clinical note transcription with the patient, who gave verbal consent to proceed.  History of Present Illness Tracey Escobar is a 53 year old female with hypertension and atrial fibrillation who presents for a physical exam and blood pressure check.  She experiences a new sensation in her right calf, describing it as persistent pain and a 'funny' feeling, with a sensation of thickness. This has been ongoing for the last three to four months, starting in early spring. No recent long flights or car trips longer than two and a half to three hours. The left leg is more swollen than the right, but the discomfort is primarily in the right leg.  She is currently taking multiple medications including metoprolol, losartan , hydrochlorothiazide , Xarelto, Trelegy, Protonix  40 mg daily, Singulair , flecainide, Xyzal , and estrogen vaginally. She has stopped taking prednisone  and Zofran , which were previously prescribed. She also takes a multivitamin and drinks at least three 20-ounce bottles of water daily.  She reports sinus issues for the past two weeks, with facial pain and discomfort. She has been taking Mucinex Max, which she stopped due to elevated heart rate, and continues to take Xyzal  for allergies. She has a history of allergies, primarily to grasses, and is scheduled for allergy testing next Wednesday. Her ears hurt and need  to be flushed, which she attributes to her sinus issues.   Hypertension This is a chronic problem. The current episode started more than 1 year ago. The problem has been gradually improving since onset. The problem is controlled. Pertinent negatives include no blurred vision. Risk factors for coronary artery disease include obesity. Past treatments include lifestyle changes, angiotensin blockers and diuretics. The current treatment provides moderate improvement. Compliance problems include exercise.  There is no history of chronic renal disease.     Past Medical History:  Diagnosis Date   Arthritis    Asthma    Atrial fibrillation (HCC)    Atrial fibrillation with RVR (HCC) 12/15/2016   Dysrhythmia    A-Fib   GERD (gastroesophageal reflux disease)    Hypertension    Irritable bowel    Nausea and vomiting 09/20/2023   Pneumonia    Sleep apnea    Sleep apnea    Vitamin D  deficiency      Family History  Problem Relation Age of Onset   Hyperlipidemia Mother    Hypertension Mother    Hyperlipidemia Father    Hypertension Father    COPD Father    Heart failure Father    Diabetes Brother    Hypertension Maternal Grandmother    Seizures Maternal Grandmother    Cancer Paternal Grandfather    Cancer Brother    Allergic rhinitis Neg Hx    Angioedema Neg Hx    Asthma Neg Hx    Eczema Neg Hx    Immunodeficiency Neg Hx    Urticaria  Neg Hx      Current Outpatient Medications:    amoxicillin -clavulanate (AUGMENTIN ) 875-125 MG tablet, Take 1 tablet by mouth 2 (two) times daily., Disp: 20 tablet, Rfl: 0   azelastine  (ASTELIN ) 0.1 % nasal spray, Place 2 sprays into both nostrils 2 (two) times daily as needed for rhinitis. Use in each nostril as directed, Disp: 30 mL, Rfl: 6   baclofen  (LIORESAL ) 10 MG tablet, Take 1 tablet (10 mg total) by mouth daily. prn, Disp: 30 tablet, Rfl: 1   CALCIUM PO, Take 1 tablet by mouth daily at 2 am., Disp: , Rfl:    cromolyn  (OPTICROM ) 4 % ophthalmic  solution, INSTILL 1 DROP IN BOTH EYES FOUR TIMES DAILY, Disp: 10 mL, Rfl: 12   dupilumab  (DUPIXENT ) 300 MG/2ML prefilled syringe, Inject 300 mg into the skin every 14 (fourteen) days., Disp: 4 mL, Rfl: 11   dupilumab  (DUPIXENT ) 300 MG/2ML prefilled syringe, Inject 2 mL (300 mg total) under the skin every 14 (fourteen) days., Disp: 4 mL, Rfl: 11   Dupilumab  (DUPIXENT ) 300 MG/2ML SOAJ, Inject 300 mg under the skin every 14 (fourteen) days., Disp: 4 mL, Rfl: 6   EPINEPHrine  0.3 mg/0.3 mL IJ SOAJ injection, Inject 0.3 mg into the muscle as needed for anaphylaxis., Disp: 2 each, Rfl: 1   Estradiol 10 MCG TABS vaginal tablet, estradiol 10 mcg vaginal tablet, Disp: , Rfl:    famotidine  (PEPCID ) 40 MG tablet, Take 1 tablet (40 mg total) by mouth daily., Disp: 30 tablet, Rfl: 5   flecainide (TAMBOCOR) 150 MG tablet, Take 150 mg by mouth 2 (two) times daily., Disp: , Rfl:    fluticasone  (FLONASE ) 50 MCG/ACT nasal spray, Place 2 sprays into both nostrils daily., Disp: 16 g, Rfl: 6   hydrochlorothiazide  (HYDRODIURIL ) 12.5 MG tablet, TAKE 1 TABLET(12.5 MG) BY MOUTH DAILY, Disp: 90 tablet, Rfl: 1   levocetirizine (XYZAL ) 5 MG tablet, TAKE 1 TABLET(5 MG) BY MOUTH EVERY EVENING, Disp: 30 tablet, Rfl: 5   losartan  (COZAAR ) 25 MG tablet, TAKE 1 TABLET(25 MG) BY MOUTH DAILY, Disp: 90 tablet, Rfl: 1   magnesium gluconate (MAGONATE) 500 MG tablet, Take 500 mg by mouth daily., Disp: , Rfl:    metoprolol succinate (TOPROL-XL) 100 MG 24 hr tablet, Take 100 mg by mouth daily. , Disp: , Rfl:    montelukast  (SINGULAIR ) 10 MG tablet, TAKE 1 TABLET(10 MG) BY MOUTH AT BEDTIME, Disp: 30 tablet, Rfl: 5   pantoprazole  (PROTONIX ) 40 MG tablet, TAKE 1 TABLET(40 MG) BY MOUTH DAILY, Disp: 90 tablet, Rfl: 1   RESTASIS  0.05 % ophthalmic emulsion, INSTILL 1 DROP IN BOTH EYES TWICE DAILY, Disp: 60 each, Rfl: 1   rivaroxaban (XARELTO) 20 MG TABS tablet, TAKE 1 TABLET(20 MG) BY MOUTH WITH SUPPER, Disp: , Rfl:    TRELEGY ELLIPTA  200-62.5-25  MCG/ACT AEPB, INHALE 1 PUFF BY MOUTH DAILY, Disp: 60 each, Rfl: 5   Vitamin D , Ergocalciferol , (DRISDOL) 1.25 MG (50000 UNIT) CAPS capsule, Take 50,000 Units by mouth every 7 (seven) days., Disp: , Rfl:   Current Facility-Administered Medications:    cyanocobalamin  ((VITAMIN B-12)) injection 1,000 mcg, 1,000 mcg, Intramuscular, Once, Ghumman, Ramandeep, NP   dupilumab  (DUPIXENT ) prefilled syringe 300 mg, 300 mg, Subcutaneous, Q14 Days, Rochester Chuck, MD, 300 mg at 10/28/23 1527   No Known Allergies    The patient states she uses none for birth control. No LMP recorded. Patient has had an injection.. Negative for Dysmenorrhea. Negative for: breast discharge, breast lump(s), breast pain and breast  self exam. Associated symptoms include abnormal vaginal bleeding. Pertinent negatives include abnormal bleeding (hematology), anxiety, decreased libido, depression, difficulty falling sleep, dyspareunia, history of infertility, nocturia, sexual dysfunction, sleep disturbances, urinary incontinence, urinary urgency, vaginal discharge and vaginal itching. Diet regular.The patient states her exercise level is  intermittent.  . The patient's tobacco use is:  Social History   Tobacco Use  Smoking Status Never  Smokeless Tobacco Never  . She has been exposed to passive smoke. The patient's alcohol use is:  Social History   Substance and Sexual Activity  Alcohol Use Yes   Alcohol/week: 1.0 standard drink of alcohol   Types: 1 Glasses of wine per week   Comment: rare use  .    Review of Systems  Constitutional: Negative.   HENT: Negative.    Eyes: Negative.  Negative for blurred vision.  Respiratory: Negative.    Cardiovascular: Negative.   Gastrointestinal: Negative.   Endocrine: Negative.   Genitourinary: Negative.   Musculoskeletal: Negative.   Skin: Negative.   Allergic/Immunologic: Negative.   Neurological: Negative.   Hematological: Negative.   Psychiatric/Behavioral: Negative.        Today's Vitals   03/14/24 0907  BP: 120/82  Pulse: 87  Temp: 98.7 F (37.1 C)  SpO2: 98%  Weight: (!) 304 lb 6.4 oz (138.1 kg)  Height: 5' 7 (1.702 m)   Body mass index is 47.68 kg/m.  Wt Readings from Last 3 Encounters:  03/14/24 (!) 304 lb 6.4 oz (138.1 kg)  09/28/23 295 lb 12.8 oz (134.2 kg)  07/29/23 (!) 302 lb 8 oz (137.2 kg)     Objective:  Physical Exam Vitals and nursing note reviewed.  Constitutional:      Appearance: Normal appearance.  HENT:     Head: Normocephalic and atraumatic.     Right Ear: Ear canal and external ear normal. There is impacted cerumen.     Left Ear: Ear canal and external ear normal. There is impacted cerumen.     Nose:     Comments: Masked     Mouth/Throat:     Comments: Masked   Eyes:     Extraocular Movements: Extraocular movements intact.     Conjunctiva/sclera: Conjunctivae normal.     Pupils: Pupils are equal, round, and reactive to light.    Cardiovascular:     Rate and Rhythm: Normal rate and regular rhythm.     Pulses: Normal pulses.     Heart sounds: Normal heart sounds.  Pulmonary:     Effort: Pulmonary effort is normal.     Breath sounds: Normal breath sounds.  Chest:  Breasts:    Tanner Score is 5.     Right: Normal.     Left: Normal.  Abdominal:     General: Bowel sounds are normal.     Palpations: Abdomen is soft.     Comments: Obese, soft. Difficult to assess organomegaly due to body habitus  Genitourinary:    Comments: deferred  Musculoskeletal:        General: Normal range of motion.     Cervical back: Normal range of motion and neck supple.   Skin:    General: Skin is warm and dry.   Neurological:     General: No focal deficit present.     Mental Status: She is alert and oriented to person, place, and time.   Psychiatric:        Mood and Affect: Mood normal.        Behavior: Behavior normal.  Assessment And Plan:     Encounter for general adult medical examination w/o abnormal  findings Assessment & Plan: A full exam was performed.  Importance of monthly self breast exams was discussed with the patient.  She is advised to get 30-45 minutes of regular exercise, no less than four to five days per week. Both weight-bearing and aerobic exercises are recommended.  She is advised to follow a healthy diet with at least six fruits/veggies per day, decrease intake of red meat and other saturated fats and to increase fish intake to twice weekly.  Meats/fish should not be fried -- baked, boiled or broiled is preferable. It is also important to cut back on your sugar intake.  Be sure to read labels - try to avoid anything with added sugar, high fructose corn syrup or other sweeteners.  If you must use a sweetener, you can try stevia or monkfruit.  It is also important to avoid artificially sweetened foods/beverages and diet drinks. Lastly, wear SPF 50 sunscreen on exposed skin and when in direct sunlight for an extended period of time.  Be sure to avoid fast food restaurants and aim for at least 60 ounces of water daily.      Orders: -     CBC -     CMP14+EGFR -     Hemoglobin A1c -     Lipid panel -     TSH  Hypertensive heart disease without heart failure Assessment & Plan: Chronic, controlled. Goal BP<120/80. EKG performed, NSR w/o acute changes.  Advised to follow low sodium diet. She will continue with losartan  25mg  daily and hydrochlorothiazide  12.5mg  daily.  She is encouraged to gradually increase her daily activity level.   Orders: -     Microalbumin / creatinine urine ratio -     POCT URINALYSIS DIP (CLINITEK) -     EKG 12-Lead  Paroxysmal atrial fibrillation (HCC) Assessment & Plan: On Xarelto, currently in sinus rhythm. - Continue Xarelto.   Right calf pain Assessment & Plan: New right calf pain and thickness for 3-4 months. Differential includes muscle strain or clot, less likely due to anticoagulation therapy. Discussed D-dimer and potential ultrasound. -  Perform physical examination of the right calf. - Order D-dimer test. - Consider ultrasound if D-dimer is elevated or indeterminate.  Orders: -     Magnesium -     CK -     D-dimer, quantitative  Bilateral impacted cerumen Assessment & Plan: Ear lavage deferred. She plans to use OTC Debrox gtt.    Right maxillary sinusitis Assessment & Plan: Sinus congestion and facial pain for two weeks, possibly sinus-related. Stopped Mucinex Max due to elevated heart rate, continues Xyzal . - Eliminate dairy from diet. - Will send rx Augmentin    Class 3 severe obesity due to excess calories with serious comorbidity and body mass index (BMI) of 45.0 to 49.9 in adult Assessment & Plan: BMI 47.  Unfortunately, her insurance does not cover prescription weight loss options.  Interested in weight loss options, considering referral for non-surgical management. - Refer to [facility] in [place].  Orders: -     Ambulatory referral to Internal Medicine  Acquired thrombophilia [facility]) Assessment & Plan: Chronic, currently on Xarelto 20mg  daily due to underlying PAF.    Urine white blood cells increased -     Urine Culture  Other orders -     Amoxicillin -Pot Clavulanate; Take 1 tablet by mouth 2 (two) times daily.  Dispense: 20 tablet; Refill: 0  Return in 6 months (on 09/13/2024), or bp check, for 1 year physical. Patient was given opportunity to ask questions. Patient verbalized understanding of the plan and was able to repeat key elements of the plan. All questions were answered to their satisfaction.   I, Smiley Dung, MD, have reviewed all documentation for this visit. The documentation on 03/14/24 for the exam, diagnosis, procedures, and orders are all accurate and complete.

## 2024-03-14 NOTE — Assessment & Plan Note (Signed)

## 2024-03-14 NOTE — Assessment & Plan Note (Signed)
 Chronic, controlled. Goal BP<120/80. EKG performed, NSR w/o acute changes.  Advised to follow low sodium diet. She will continue with losartan  25mg  daily and hydrochlorothiazide  12.5mg  daily.  She is encouraged to gradually increase her daily activity level.

## 2024-03-14 NOTE — Assessment & Plan Note (Signed)
 BMI 47.  Unfortunately, her insurance does not cover prescription weight loss options.  Interested in weight loss options, considering referral for non-surgical management. - Refer to [facility] in [place].

## 2024-03-14 NOTE — Assessment & Plan Note (Signed)
 Ear lavage deferred. She plans to use OTC Debrox gtt.

## 2024-03-14 NOTE — Patient Instructions (Signed)

## 2024-03-14 NOTE — Telephone Encounter (Signed)
 PT CAME IN FOR PHY WHEN INSURANCE WAS UPDATED PT WAS INFORMED THAT INSURANCE WAS OUT OF NETWORK. PT WAS ALSO IN FORMED THAT SHE WILL RECEIVE A BILL AND IF SHE FELT LIKE THIS IS A MISTAKE TO CALL HER INSURANCE TO VERIFY. PT WAS GIVEN THE OPTION TO RESCHEDULE APPT. PT DID NOT WANT TO RESCHEDULE DUE TO ALREADY TAKING OFF OF WORK AND AGREED TO SELF PAY IF VISIT WAS NOT COVERED.

## 2024-03-14 NOTE — Assessment & Plan Note (Signed)
 Chronic, currently on Xarelto 20mg  daily due to underlying PAF.

## 2024-03-14 NOTE — Assessment & Plan Note (Signed)
 New right calf pain and thickness for 3-4 months. Differential includes muscle strain or clot, less likely due to anticoagulation therapy. Discussed D-dimer and potential ultrasound. - Perform physical examination of the right calf. - Order D-dimer test. - Consider ultrasound if D-dimer is elevated or indeterminate.

## 2024-03-14 NOTE — Assessment & Plan Note (Signed)
 On Xarelto, currently in sinus rhythm. - Continue Xarelto.

## 2024-03-14 NOTE — Assessment & Plan Note (Signed)
 Sinus congestion and facial pain for two weeks, possibly sinus-related. Stopped Mucinex Max due to elevated heart rate, continues Xyzal . - Eliminate dairy from diet. - Will send rx Augmentin 

## 2024-03-15 LAB — CBC
Hematocrit: 43 % (ref 34.0–46.6)
Hemoglobin: 13.8 g/dL (ref 11.1–15.9)
MCH: 28.6 pg (ref 26.6–33.0)
MCHC: 32.1 g/dL (ref 31.5–35.7)
MCV: 89 fL (ref 79–97)
Platelets: 321 10*3/uL (ref 150–450)
RBC: 4.83 x10E6/uL (ref 3.77–5.28)
RDW: 13.5 % (ref 11.7–15.4)
WBC: 6.1 10*3/uL (ref 3.4–10.8)

## 2024-03-15 LAB — LIPID PANEL
Chol/HDL Ratio: 3 ratio (ref 0.0–4.4)
Cholesterol, Total: 164 mg/dL (ref 100–199)
HDL: 55 mg/dL (ref >39)
LDL Chol Calc (NIH): 91 mg/dL (ref 0–99)
Triglycerides: 99 mg/dL (ref 0–149)
VLDL Cholesterol Cal: 18 mg/dL (ref 5–40)

## 2024-03-15 LAB — CMP14+EGFR
ALT: 12 IU/L (ref 0–32)
AST: 13 IU/L (ref 0–40)
Albumin: 4.1 g/dL (ref 3.8–4.9)
Alkaline Phosphatase: 118 IU/L (ref 44–121)
BUN/Creatinine Ratio: 20 (ref 9–23)
BUN: 16 mg/dL (ref 6–24)
Bilirubin Total: 0.4 mg/dL (ref 0.0–1.2)
CO2: 22 mmol/L (ref 20–29)
Calcium: 10.3 mg/dL — ABNORMAL HIGH (ref 8.7–10.2)
Chloride: 104 mmol/L (ref 96–106)
Creatinine, Ser: 0.82 mg/dL (ref 0.57–1.00)
Globulin, Total: 3.6 g/dL (ref 1.5–4.5)
Glucose: 82 mg/dL (ref 70–99)
Potassium: 4.4 mmol/L (ref 3.5–5.2)
Sodium: 142 mmol/L (ref 134–144)
Total Protein: 7.7 g/dL (ref 6.0–8.5)
eGFR: 85 mL/min/{1.73_m2} (ref >59)

## 2024-03-15 LAB — CK: Total CK: 62 U/L (ref 32–182)

## 2024-03-15 LAB — MICROALBUMIN / CREATININE URINE RATIO
Creatinine, Urine: 115.2 mg/dL
Microalb/Creat Ratio: 62 mg/g{creat} — ABNORMAL HIGH (ref 0–29)
Microalbumin, Urine: 71.2 ug/mL

## 2024-03-15 LAB — TSH: TSH: 1.49 u[IU]/mL (ref 0.450–4.500)

## 2024-03-15 LAB — MAGNESIUM: Magnesium: 2.3 mg/dL (ref 1.6–2.3)

## 2024-03-15 LAB — D-DIMER, QUANTITATIVE: D-DIMER: 0.29 mg{FEU}/L (ref 0.00–0.49)

## 2024-03-15 LAB — HEMOGLOBIN A1C
Est. average glucose Bld gHb Est-mCnc: 111 mg/dL
Hgb A1c MFr Bld: 5.5 % (ref 4.8–5.6)

## 2024-03-28 ENCOUNTER — Other Ambulatory Visit (HOSPITAL_COMMUNITY): Payer: Self-pay

## 2024-03-30 ENCOUNTER — Other Ambulatory Visit (HOSPITAL_COMMUNITY): Payer: Self-pay

## 2024-03-30 ENCOUNTER — Other Ambulatory Visit: Payer: Self-pay

## 2024-03-30 NOTE — Progress Notes (Signed)
 Specialty Pharmacy Refill Coordination Note  Tracey Escobar is a 53 y.o. female contacted today regarding refills of specialty medication(s) Dupilumab  (Dupixent )   Patient requested Delivery (deliver to [facility])   Delivery date: 04/05/24   Verified address: [address] [place], [place] [address]   Medication will be filled on 04/04/24.

## 2024-04-04 ENCOUNTER — Other Ambulatory Visit: Payer: Self-pay

## 2024-04-16 ENCOUNTER — Encounter: Payer: Self-pay | Admitting: Internal Medicine

## 2024-04-17 ENCOUNTER — Other Ambulatory Visit (HOSPITAL_COMMUNITY): Payer: Self-pay

## 2024-04-21 ENCOUNTER — Other Ambulatory Visit: Payer: Self-pay

## 2024-04-25 ENCOUNTER — Other Ambulatory Visit (HOSPITAL_COMMUNITY): Payer: Self-pay

## 2024-04-27 ENCOUNTER — Other Ambulatory Visit: Payer: Self-pay

## 2024-04-28 ENCOUNTER — Other Ambulatory Visit: Payer: Self-pay

## 2024-04-28 NOTE — Progress Notes (Signed)
 Specialty Pharmacy Refill Coordination Note  Tracey Escobar is a 53 y.o. female contacted today regarding refills of specialty medication(s) Dupilumab  (Dupixent )   Patient requested Delivery   Delivery date: 05/02/24   Verified address: [address] [place], [place] [address]   Medication will be filled on 05/02/24.

## 2024-05-01 ENCOUNTER — Other Ambulatory Visit: Payer: Self-pay

## 2024-05-04 ENCOUNTER — Ambulatory Visit: Payer: BLUE CROSS/BLUE SHIELD | Admitting: Internal Medicine

## 2024-05-18 ENCOUNTER — Other Ambulatory Visit: Payer: Self-pay | Admitting: Internal Medicine

## 2024-05-18 ENCOUNTER — Other Ambulatory Visit: Payer: Self-pay | Admitting: Pharmacy Technician

## 2024-05-18 ENCOUNTER — Other Ambulatory Visit: Payer: Self-pay

## 2024-05-18 NOTE — Progress Notes (Signed)
 Specialty Pharmacy Refill Coordination Note  Tracey Escobar is a 53 y.o. female contacted today regarding refills of specialty medication(s) Dupilumab  (Dupixent )   Patient requested Delivery   Delivery date: 05/30/24   Verified address: MD Office: [address], [address] [place], [place] [address]   Medication will be filled on 05/29/24.   Appointment Injection on 06/02/24.

## 2024-05-22 ENCOUNTER — Encounter: Payer: Self-pay | Admitting: Internal Medicine

## 2024-05-23 ENCOUNTER — Telehealth: Payer: Self-pay

## 2024-05-23 NOTE — Transitions of Care (Post Inpatient/ED Visit) (Signed)
   05/23/2024  Name: Tracey Escobar MRN: [id number] DOB: 1971/06/17  Today's TOC FU Call Status: Today's TOC FU Call Status:: Successful TOC FU Call Completed TOC FU Call Complete Date: 05/23/24 Patient's Name and Date of Birth confirmed.  Transition Care Management Follow-up Telephone Call Date of Discharge: 05/17/24 Discharge Facility: Other ([facility]) Name of Other ([facility]) Discharge Facility: [facility] Type of Discharge: Emergency Department Reason for ED Visit: Other: How have you been since you were released from the hospital?: Better Any questions or concerns?: Yes Patient Questions/Concerns:: she is concerned about the swelling in her legs. Patient Questions/Concerns Addressed: Notified Provider of Patient Questions/Concerns  Items Reviewed: Did you receive and understand the discharge instructions provided?: Yes Medications obtained,verified, and reconciled?: Yes (Medications Reviewed) Any new allergies since your discharge?: No Dietary orders reviewed?: NA Do you have support at home?: Yes People in Home [RPT]: sibling(s) Name of Support/Comfort Primary Source: Tracey Escobar  Medications Reviewed Today: Medications Reviewed Today   Medications were not reviewed in this encounter     Home Care and Equipment/Supplies: Were Home Health Services Ordered?: NA Any new equipment or medical supplies ordered?: NA  Functional Questionnaire: Do you need assistance with bathing/showering or dressing?: No Do you need assistance with meal preparation?: No Do you need assistance with eating?: No Do you have difficulty maintaining continence: No Do you need assistance with getting out of bed/getting out of a chair/moving?: No Do you have difficulty managing or taking your medications?: No  Follow up appointments reviewed: PCP Follow-up appointment confirmed?: Yes Date of PCP follow-up appointment?: 05/31/24 Follow-up Provider: Catheryn Slocumb MD Specialist  Hospital Follow-up appointment confirmed?: NA (recommended pcp do referral to vascular) Do you need transportation to your follow-up appointment?: No Do you understand care options if your condition(s) worsen?: Yes-patient verbalized understanding    SIGNATUREYL,RMA

## 2024-05-29 ENCOUNTER — Other Ambulatory Visit: Payer: Self-pay

## 2024-05-30 ENCOUNTER — Ambulatory Visit: Payer: Self-pay

## 2024-05-30 NOTE — Telephone Encounter (Signed)
 FYI Only or Action Required?: Action required by provider: request for appointment.  Patient was last seen in primary care on 03/14/2024 by [name], MD.  Called Nurse Triage reporting Leg Swelling.  Symptoms began several weeks ago.  Interventions attempted: Rest, hydration, or home remedies.  Symptoms are: gradually improving.  Triage Disposition: No disposition on file.  Patient/caregiver understands and will follow disposition?: Copied from CRM [id number]. Topic: Clinical - Red Word Triage >> May 30, 2024  3:07 PM Tracey Escobar wrote: Red Word that prompted transfer to Nurse Triage: Patient states she is still experiencing swelling in her left leg and ankle. Patient is scheduled for a hospital follow up tomorrow but unable to make it. Answer Assessment - Initial Assessment Questions Numbness and tingling are now intermittent compared to ED visit where it was more constant. Pt called to reschedule hospital f/u appt. After tomorrow pt will be out of window for hospital f/u time frame. New acute appt is 9/2 with PCP.       1. ONSET: When did the swelling start? (e.g., minutes, hours, days)     Over 2 weeks  2. LOCATION: What part of the leg is swollen?  Are both legs swollen or just one leg?     Left leg 3. SEVERITY: How bad is the swelling? (e.g., localized; mild, moderate, severe)     Mild to moderate 4. REDNESS: Is there redness or signs of infection?     denies 5. PAIN: Is the swelling painful to touch? If Yes, ask: How painful is it?   (Scale 1-10; mild, moderate or severe)     denies 6. FEVER: Do you have a fever? If Yes, ask: What is it, how was it measured, and when did it start?      denies 7. CAUSE: What do you think is causing the leg swelling?     Not sure 8. MEDICAL HISTORY: Do you have a history of blood clots (e.g., DVT), cancer, heart failure, kidney disease, or liver failure?     denies 9. RECURRENT SYMPTOM: Have you had leg swelling  before? If Yes, ask: When was the last time? What happened that time?     Not like this 10. OTHER SYMPTOMS: Do you have any other symptoms? (e.g., chest pain, difficulty breathing)       denies  Protocols used: Leg Swelling and Edema-A-AH

## 2024-05-31 ENCOUNTER — Inpatient Hospital Stay: Payer: Self-pay | Admitting: Internal Medicine

## 2024-06-06 ENCOUNTER — Ambulatory Visit: Payer: Self-pay | Admitting: Internal Medicine

## 2024-06-06 ENCOUNTER — Encounter: Payer: Self-pay | Admitting: Internal Medicine

## 2024-06-06 VITALS — BP 126/80 | HR 75 | Temp 98.0°F | Ht 67.0 in | Wt 303.0 lb

## 2024-06-06 DIAGNOSIS — I480 Paroxysmal atrial fibrillation: Secondary | ICD-10-CM

## 2024-06-06 DIAGNOSIS — M545 Low back pain, unspecified: Secondary | ICD-10-CM

## 2024-06-06 DIAGNOSIS — R600 Localized edema: Secondary | ICD-10-CM | POA: Diagnosis not present

## 2024-06-06 DIAGNOSIS — I119 Hypertensive heart disease without heart failure: Secondary | ICD-10-CM | POA: Diagnosis not present

## 2024-06-06 DIAGNOSIS — M79609 Pain in unspecified limb: Secondary | ICD-10-CM | POA: Diagnosis not present

## 2024-06-06 DIAGNOSIS — R202 Paresthesia of skin: Secondary | ICD-10-CM

## 2024-06-06 DIAGNOSIS — Z6841 Body Mass Index (BMI) 40.0 and over, adult: Secondary | ICD-10-CM

## 2024-06-06 DIAGNOSIS — E66813 Obesity, class 3: Secondary | ICD-10-CM

## 2024-06-06 MED ORDER — CYANOCOBALAMIN 1000 MCG/ML IJ SOLN
1000.0000 ug | Freq: Once | INTRAMUSCULAR | Status: AC
  Administered 2024-06-07: 1000 ug via INTRAMUSCULAR

## 2024-06-06 NOTE — Assessment & Plan Note (Signed)
 Chronic, controlled. Goal BP<130/80.  Advised to follow low sodium diet. She will continue with losartan  25mg  daily and hydrochlorothiazide  12.5mg  daily.  She is encouraged to gradually increase her daily activity level.

## 2024-06-06 NOTE — Progress Notes (Signed)
 I,Victoria T Emmitt, CMA,acting as a Neurosurgeon for Catheryn LOISE Slocumb, MD.,have documented all relevant documentation on the behalf of Catheryn LOISE Slocumb, MD,as directed by  Catheryn LOISE Slocumb, MD while in the presence of Catheryn LOISE Slocumb, MD.  Subjective:  Patient ID: Tracey Escobar , female    DOB: 02/11/1971 , 53 y.o.   MRN: [id number]  Chief Complaint  Patient presents with   Leg Swelling    Patient presents today for left leg swelling. This has been an on/off issue sine June. This has gotten worse within the last month. She also experiences tingling and numbness , happens occasionally.     HPI Discussed the use of AI scribe software for clinical note transcription with the patient, who gave verbal consent to proceed.  History of Present Illness Tracey Escobar is a 53 year old female who presents for a follow-up visit after an ED visit for leg swelling, numbness, and tingling.  She initially experienced swelling, numbness, and tingling in her left leg, foot, and ankle, which has now started to affect the right leg as well. The swelling was persistent and did not resolve overnight as it typically does when she consumes too much salt. She documented the progression of the swelling with photographs.  She experienced shortness of breath both at rest and with activity, which prompted her to seek emergency care. The swelling was particularly worse on the day she decided to visit the emergency department.  No recent travel, including car or plane trips, which could have contributed to the swelling. The numbness and tingling are primarily in the leg rather than the toes.  She has a history of daily back pain, with numbness mostly in the left leg, though the right leg is sometimes affected. She has a cardiologist and had an echocardiogram recently, though the exact date is not specified.   Hypertension This is a chronic problem. The current episode started more than 1 year ago. The problem has been  gradually improving since onset. The problem is controlled. Pertinent negatives include no blurred vision, headaches, palpitations or shortness of breath. Risk factors for coronary artery disease include obesity. Past treatments include lifestyle changes, angiotensin blockers and diuretics. The current treatment provides moderate improvement.     Past Medical History:  Diagnosis Date   Arthritis    Asthma    Atrial fibrillation (HCC)    Atrial fibrillation with RVR (HCC) 12/15/2016   Dysrhythmia    A-Fib   GERD (gastroesophageal reflux disease)    Hypertension    Irritable bowel    Nausea and vomiting 09/20/2023   Pneumonia    Sleep apnea    Sleep apnea    Vitamin D  deficiency      Family History  Problem Relation Age of Onset   Hyperlipidemia Mother    Hypertension Mother    Hyperlipidemia Father    Hypertension Father    COPD Father    Heart failure Father    Diabetes Brother    Hypertension Maternal Grandmother    Seizures Maternal Grandmother    Cancer Paternal Grandfather    Cancer Brother    Allergic rhinitis Neg Hx    Angioedema Neg Hx    Asthma Neg Hx    Eczema Neg Hx    Immunodeficiency Neg Hx    Urticaria Neg Hx      Current Outpatient Medications:    azelastine  (ASTELIN ) 0.1 % nasal spray, Place 2 sprays into both nostrils 2 (two) times daily as  needed for rhinitis. Use in each nostril as directed, Disp: 30 mL, Rfl: 6   baclofen  (LIORESAL ) 10 MG tablet, Take 1 tablet (10 mg total) by mouth daily. prn, Disp: 30 tablet, Rfl: 1   CALCIUM PO, Take 1 tablet by mouth daily at 2 am., Disp: , Rfl:    cromolyn  (OPTICROM ) 4 % ophthalmic solution, INSTILL 1 DROP IN BOTH EYES FOUR TIMES DAILY, Disp: 10 mL, Rfl: 12   dupilumab  (DUPIXENT ) 300 MG/2ML prefilled syringe, Inject 300 mg into the skin every 14 (fourteen) days., Disp: 4 mL, Rfl: 11   dupilumab  (DUPIXENT ) 300 MG/2ML prefilled syringe, Inject 2 mL (300 mg total) under the skin every 14 (fourteen) days., Disp: 4  mL, Rfl: 11   Dupilumab  (DUPIXENT ) 300 MG/2ML SOAJ, Inject 300 mg under the skin every 14 (fourteen) days., Disp: 4 mL, Rfl: 6   EPINEPHrine  0.3 mg/0.3 mL IJ SOAJ injection, Inject 0.3 mg into the muscle as needed for anaphylaxis., Disp: 2 each, Rfl: 1   Estradiol 10 MCG TABS vaginal tablet, estradiol 10 mcg vaginal tablet, Disp: , Rfl:    famotidine  (PEPCID ) 40 MG tablet, Take 1 tablet (40 mg total) by mouth daily., Disp: 30 tablet, Rfl: 5   flecainide (TAMBOCOR) 150 MG tablet, Take 150 mg by mouth 2 (two) times daily., Disp: , Rfl:    fluticasone  (FLONASE ) 50 MCG/ACT nasal spray, Place 2 sprays into both nostrils daily., Disp: 16 g, Rfl: 6   hydrochlorothiazide  (HYDRODIURIL ) 12.5 MG tablet, TAKE 1 TABLET(12.5 MG) BY MOUTH DAILY, Disp: 90 tablet, Rfl: 1   levocetirizine (XYZAL ) 5 MG tablet, TAKE 1 TABLET(5 MG) BY MOUTH EVERY EVENING, Disp: 30 tablet, Rfl: 5   losartan  (COZAAR ) 25 MG tablet, TAKE 1 TABLET(25 MG) BY MOUTH DAILY, Disp: 90 tablet, Rfl: 1   magnesium gluconate (MAGONATE) 500 MG tablet, Take 500 mg by mouth daily., Disp: , Rfl:    metoprolol succinate (TOPROL-XL) 100 MG 24 hr tablet, Take 100 mg by mouth daily. , Disp: , Rfl:    montelukast  (SINGULAIR ) 10 MG tablet, TAKE 1 TABLET(10 MG) BY MOUTH AT BEDTIME, Disp: 30 tablet, Rfl: 5   pantoprazole  (PROTONIX ) 40 MG tablet, TAKE 1 TABLET(40 MG) BY MOUTH DAILY, Disp: 90 tablet, Rfl: 1   RESTASIS  0.05 % ophthalmic emulsion, INSTILL 1 DROP IN BOTH EYES TWICE DAILY, Disp: 60 each, Rfl: 1   rivaroxaban (XARELTO) 20 MG TABS tablet, TAKE 1 TABLET(20 MG) BY MOUTH WITH SUPPER, Disp: , Rfl:    TRELEGY ELLIPTA  200-62.5-25 MCG/ACT AEPB, INHALE 1 PUFF BY MOUTH DAILY, Disp: 60 each, Rfl: 5   Vitamin D , Ergocalciferol , (DRISDOL) 1.25 MG (50000 UNIT) CAPS capsule, Take 50,000 Units by mouth every 7 (seven) days., Disp: , Rfl:    amoxicillin -clavulanate (AUGMENTIN ) 875-125 MG tablet, Take 1 tablet by mouth 2 (two) times daily. (Patient not taking: Reported  on 06/06/2024), Disp: 20 tablet, Rfl: 0  Current Facility-Administered Medications:    cyanocobalamin  ((VITAMIN B-12)) injection 1,000 mcg, 1,000 mcg, Intramuscular, Once, Ghumman, Ramandeep, NP   cyanocobalamin  (VITAMIN B12) injection 1,000 mcg, 1,000 mcg, Intramuscular, Once,    dupilumab  (DUPIXENT ) prefilled syringe 300 mg, 300 mg, Subcutaneous, Q14 Days, Iva Marty Saltness, MD, 300 mg at 10/28/23 1527   No Known Allergies   Review of Systems  Constitutional: Negative.   Eyes:  Negative for blurred vision.  Respiratory: Negative.  Negative for shortness of breath.   Cardiovascular:  Positive for leg swelling. Negative for palpitations.  Gastrointestinal: Negative.   Neurological:  Positive  for numbness. Negative for headaches.  Psychiatric/Behavioral: Negative.       Today's Vitals   06/06/24 1628  BP: 126/80  Pulse: 75  Temp: 98 F (36.7 C)  SpO2: 98%  Weight: (!) 303 lb (137.4 kg)  Height: 5' 7 (1.702 m)   Body mass index is 47.46 kg/m.  Wt Readings from Last 3 Encounters:  06/06/24 (!) 303 lb (137.4 kg)  03/14/24 (!) 304 lb 6.4 oz (138.1 kg)  09/28/23 295 lb 12.8 oz (134.2 kg)     Objective:  Physical Exam Vitals and nursing note reviewed.  Constitutional:      Appearance: Normal appearance. She is obese.  HENT:     Head: Normocephalic and atraumatic.  Eyes:     Extraocular Movements: Extraocular movements intact.  Cardiovascular:     Rate and Rhythm: Normal rate and regular rhythm.     Heart sounds: Normal heart sounds.  Pulmonary:     Effort: Pulmonary effort is normal.     Breath sounds: Normal breath sounds.  Musculoskeletal:     Cervical back: Normal range of motion.     Right lower leg: Edema present.     Left lower leg: Edema present.     Comments: L>R  Skin:    General: Skin is warm.  Neurological:     General: No focal deficit present.     Mental Status: She is alert.  Psychiatric:        Mood and Affect: Mood normal.        Behavior:  Behavior normal.         Assessment And Plan:  Peripheral edema Assessment & Plan: ED notes reviewed.  Bilateral lower extremity swelling and numbness, initially more on the left, now bilateral. Shortness of breath improved. Possible venous reflux suspected by ED physician. - Refer to vascular specialist for venous reflux evaluation. - Advise use of rebounder for fluid circulation and lymphatic support.   Orders: -     Ambulatory referral to Vascular Surgery  Paresthesia and pain of left extremity Assessment & Plan: Possibly related to lumbar back pain.  - Consider EMG/NCS if sx persist.    Orders: -     Vitamin B12 -     Cyanocobalamin   Hypertensive heart disease without heart failure Assessment & Plan: Chronic, controlled. Goal BP<130/80.  Advised to follow low sodium diet. She will continue with losartan  25mg  daily and hydrochlorothiazide  12.5mg  daily.  She is encouraged to gradually increase her daily activity level.    Paroxysmal atrial fibrillation (HCC) Assessment & Plan: On Xarelto, currently in sinus rhythm. - Continue Xarelto.   Lumbar pain Assessment & Plan: Chronic daily back pain with associated leg numbness, potentially contributing to lower extremity symptoms. - Would likely benefit from PT   Class 3 severe obesity due to excess calories with serious comorbidity and body mass index (BMI) of 45.0 to 49.9 in adult Assessment & Plan: BMI 47. She was previously referred to [facility] in [place].  She is encouraged to initially strive for BMI less than 40 to decrease cardiac risk. Advised to aim for at least 150 minutes of exercise per week.      Return if symptoms worsen or fail to improve.  Patient was given opportunity to ask questions. Patient verbalized understanding of the plan and was able to repeat key elements of the plan. All questions were answered to their satisfaction.   I, Catheryn LOISE Slocumb, MD, have reviewed all documentation  for this  visit. The documentation on 06/06/24 for the exam, diagnosis, procedures, and orders are all accurate and complete.   IF YOU HAVE BEEN REFERRED TO A SPECIALIST, IT MAY TAKE 1-2 WEEKS TO SCHEDULE/PROCESS THE REFERRAL. IF YOU HAVE NOT HEARD FROM US /SPECIALIST IN TWO WEEKS, PLEASE GIVE US  A CALL AT [phone].   THE PATIENT IS ENCOURAGED TO PRACTICE SOCIAL DISTANCING DUE TO THE COVID-19 PANDEMIC.

## 2024-06-06 NOTE — Patient Instructions (Signed)
 Edema  Edema is when you have too much fluid in your body or under your skin. Edema may make your legs, feet, and ankles swell. Swelling often happens in looser tissues, such as around your eyes. This is a common condition. It gets more common as you get older. There are many possible causes of edema. These include: Eating too much salt (sodium). Being on your feet or sitting for a long time. Certain medical conditions, such as: Pregnancy. Heart failure. Liver disease. Kidney disease. Cancer. Hot weather may make edema worse. Edema is usually painless. Your skin may look swollen or shiny. Follow these instructions at home: Medicines Take over-the-counter and prescription medicines only as told by your doctor. Your doctor may prescribe a medicine to help your body get rid of extra water (diuretic). Take this medicine if you are told to take it. Eating and drinking Eat a low-salt (low-sodium) diet as told by your doctor. Sometimes, eating less salt may reduce swelling. Depending on the cause of your swelling, you may need to limit how much fluid you drink (fluid restriction). General instructions Raise the injured area above the level of your heart while you are sitting or lying down. Do not sit still or stand for a long time. Do not wear tight clothes. Do not wear garters on your upper legs. Exercise your legs. This can help the swelling go down. Wear compression stockings as told by your doctor. It is important that these are the right size. These should be prescribed by your doctor to prevent possible injuries. If elastic bandages or wraps are recommended, use them as told by your doctor. Contact a doctor if: Treatment is not working. You have heart, liver, or kidney disease and have symptoms of edema. You have sudden and unexplained weight gain. Get help right away if: You have shortness of breath or chest pain. You cannot breathe when you lie down. You have pain, redness, or  warmth in the swollen areas. You have heart, liver, or kidney disease and get edema all of a sudden. You have a fever and your symptoms get worse all of a sudden. These symptoms may be an emergency. Get help right away. Call 911. Do not wait to see if the symptoms will go away. Do not drive yourself to the hospital. Summary Edema is when you have too much fluid in your body or under your skin. Edema may make your legs, feet, and ankles swell. Swelling often happens in looser tissues, such as around your eyes. Raise the injured area above the level of your heart while you are sitting or lying down. Follow your doctor's instructions about diet and how much fluid you can drink. This information is not intended to replace advice given to you by your health care provider. Make sure you discuss any questions you have with your health care provider. Document Revised: 05/26/2021 Document Reviewed: 05/26/2021 Elsevier Patient Education  2024 [employer].

## 2024-06-06 NOTE — Assessment & Plan Note (Signed)
 On Xarelto, currently in sinus rhythm. - Continue Xarelto.

## 2024-06-06 NOTE — Assessment & Plan Note (Signed)
 BMI 47. She was previously referred to [facility] in [place].  She is encouraged to initially strive for BMI less than 40 to decrease cardiac risk. Advised to aim for at least 150 minutes of exercise per week.

## 2024-06-06 NOTE — Assessment & Plan Note (Signed)
 Chronic daily back pain with associated leg numbness, potentially contributing to lower extremity symptoms. - Would likely benefit from PT

## 2024-06-06 NOTE — Assessment & Plan Note (Signed)
 Possibly related to lumbar back pain.  - Consider EMG/NCS if sx persist.

## 2024-06-06 NOTE — Assessment & Plan Note (Signed)
 ED notes reviewed.  Bilateral lower extremity swelling and numbness, initially more on the left, now bilateral. Shortness of breath improved. Possible venous reflux suspected by ED physician. - Refer to vascular specialist for venous reflux evaluation. - Advise use of rebounder for fluid circulation and lymphatic support.

## 2024-06-07 ENCOUNTER — Ambulatory Visit: Payer: Self-pay | Admitting: Internal Medicine

## 2024-06-07 DIAGNOSIS — M79609 Pain in unspecified limb: Secondary | ICD-10-CM | POA: Diagnosis not present

## 2024-06-07 DIAGNOSIS — R202 Paresthesia of skin: Secondary | ICD-10-CM | POA: Diagnosis not present

## 2024-06-07 LAB — VITAMIN B12: Vitamin B-12: 1139 pg/mL (ref 232–1245)

## 2024-06-16 ENCOUNTER — Telehealth: Payer: Self-pay | Admitting: Allergy & Immunology

## 2024-06-16 NOTE — Telephone Encounter (Signed)
 Called patient regarding Dupixent . She is currently on injection but another provider is prescribing it. She had to switch practices due to her [employer]. [place] the beginning of the year. She will call us  back to get established with us  again.

## 2024-06-19 ENCOUNTER — Other Ambulatory Visit: Payer: Self-pay

## 2024-06-21 ENCOUNTER — Other Ambulatory Visit: Payer: Self-pay

## 2024-06-23 ENCOUNTER — Other Ambulatory Visit (HOSPITAL_COMMUNITY): Payer: Self-pay

## 2024-06-23 ENCOUNTER — Other Ambulatory Visit: Payer: Self-pay

## 2024-06-23 NOTE — Progress Notes (Signed)
 Specialty Pharmacy Refill Coordination Note  Spoke with Tracey Escobar is a 53 y.o. female contacted today regarding refills of specialty medication(s) Dupilumab  (Dupixent )  Doses on hand: 0  Injection date: 06/30/24   Patient requested: Delivery   Delivery date: 06/27/24   Verified address: MD office: [address] [place] [address]  Medication will be filled on 06/26/24.

## 2024-06-26 ENCOUNTER — Other Ambulatory Visit: Payer: Self-pay

## 2024-07-02 ENCOUNTER — Encounter: Payer: Self-pay | Admitting: Internal Medicine

## 2024-07-17 ENCOUNTER — Other Ambulatory Visit: Payer: Self-pay

## 2024-07-19 ENCOUNTER — Other Ambulatory Visit (HOSPITAL_COMMUNITY): Payer: Self-pay

## 2024-07-21 ENCOUNTER — Other Ambulatory Visit: Payer: Self-pay

## 2024-07-24 ENCOUNTER — Other Ambulatory Visit: Payer: Self-pay

## 2024-07-27 ENCOUNTER — Other Ambulatory Visit: Payer: Self-pay

## 2024-07-28 ENCOUNTER — Other Ambulatory Visit (HOSPITAL_COMMUNITY): Payer: Self-pay

## 2024-07-28 NOTE — Progress Notes (Signed)
 Specialty Pharmacy Refill Coordination Note  Tracey Escobar is a 53 y.o. female contacted today regarding refills of specialty medication(s) Dupilumab  (Dupixent )   Patient requested Delivery   Delivery date: 08/08/24   Verified address: MD office: [address] [place] [address]   Medication will be filled on: 08/07/24

## 2024-08-04 ENCOUNTER — Other Ambulatory Visit: Payer: Self-pay

## 2024-08-07 ENCOUNTER — Other Ambulatory Visit: Payer: Self-pay

## 2024-08-17 ENCOUNTER — Other Ambulatory Visit: Payer: Self-pay | Admitting: Internal Medicine

## 2024-08-25 ENCOUNTER — Other Ambulatory Visit: Payer: Self-pay

## 2024-08-28 ENCOUNTER — Other Ambulatory Visit: Payer: Self-pay

## 2024-08-30 ENCOUNTER — Other Ambulatory Visit (HOSPITAL_COMMUNITY): Payer: Self-pay

## 2024-09-01 ENCOUNTER — Other Ambulatory Visit: Payer: Self-pay

## 2024-09-05 ENCOUNTER — Other Ambulatory Visit: Payer: Self-pay

## 2024-09-05 NOTE — Progress Notes (Signed)
 Specialty Pharmacy Refill Coordination Note  Tracey Escobar is a 53 y.o. female assessed today regarding refills of clinic administered specialty medication(s) Dupilumab  (Dupixent )   Clinic requested Delivery   Delivery date: 09/06/24   Verified address: MD office: [address] [place] [address]   Medication will be filled on: 09/05/24   Copay:$0.00 Appointment: 12.04.25   Received call from nurse at [facility] requesting delivery.

## 2024-09-27 ENCOUNTER — Ambulatory Visit: Payer: Self-pay | Admitting: Internal Medicine

## 2024-09-27 ENCOUNTER — Other Ambulatory Visit: Payer: Self-pay

## 2024-09-29 ENCOUNTER — Other Ambulatory Visit (HOSPITAL_COMMUNITY): Payer: Self-pay

## 2024-09-29 ENCOUNTER — Other Ambulatory Visit: Payer: Self-pay

## 2024-09-29 NOTE — Progress Notes (Signed)
 Specialty Pharmacy Refill Coordination Note  Tracey Escobar is a 53 y.o. female assessed today regarding refills of clinic administered specialty medication(s) Dupilumab  (Dupixent )   Clinic requested Delivery   Delivery date: 10/03/24   Verified address: MD office: [address] [place] [address]   Medication will be filled on: 10/02/24

## 2024-10-02 ENCOUNTER — Other Ambulatory Visit: Payer: Self-pay

## 2024-10-17 ENCOUNTER — Encounter: Payer: Self-pay | Admitting: Internal Medicine

## 2024-10-23 ENCOUNTER — Other Ambulatory Visit: Payer: Self-pay

## 2024-10-24 ENCOUNTER — Other Ambulatory Visit: Payer: Self-pay

## 2024-10-24 ENCOUNTER — Other Ambulatory Visit (HOSPITAL_COMMUNITY): Payer: Self-pay

## 2024-10-26 ENCOUNTER — Other Ambulatory Visit (HOSPITAL_COMMUNITY): Payer: Self-pay

## 2024-10-31 ENCOUNTER — Other Ambulatory Visit: Payer: Self-pay

## 2024-11-07 ENCOUNTER — Other Ambulatory Visit: Payer: Self-pay

## 2024-11-07 ENCOUNTER — Encounter: Payer: Self-pay | Admitting: Internal Medicine

## 2024-11-09 ENCOUNTER — Other Ambulatory Visit (HOSPITAL_COMMUNITY): Payer: Self-pay

## 2024-11-09 ENCOUNTER — Other Ambulatory Visit: Payer: Self-pay

## 2024-11-09 NOTE — Progress Notes (Signed)
 Specialty Pharmacy Refill Coordination Note  Tracey Escobar is a 54 y.o. female assessed today regarding refills of clinic administered specialty medication(s) Dupilumab  (Dupixent )   Clinic requested Courier to Provider Office   Delivery date: 11/23/24   Verified address: [facility] [address] [place] [address]   Medication will be filled on: 11/22/24

## 2024-11-28 ENCOUNTER — Ambulatory Visit: Admitting: Internal Medicine

## 2025-03-19 ENCOUNTER — Encounter: Payer: Self-pay | Admitting: Internal Medicine
